# Patient Record
Sex: Female | Born: 1935 | Hispanic: No | Smoking: Never smoker
Health system: Southern US, Community
[De-identification: ages and names within clinical notes are randomized; demographics above are authoritative.]

## PROBLEM LIST (undated history)

## (undated) DIAGNOSIS — I639 Cerebral infarction, unspecified: Secondary | ICD-10-CM

## (undated) DIAGNOSIS — F32A Depression, unspecified: Secondary | ICD-10-CM

## (undated) DIAGNOSIS — K759 Inflammatory liver disease, unspecified: Secondary | ICD-10-CM

## (undated) DIAGNOSIS — M1990 Unspecified osteoarthritis, unspecified site: Secondary | ICD-10-CM

## (undated) DIAGNOSIS — G4700 Insomnia, unspecified: Secondary | ICD-10-CM

## (undated) DIAGNOSIS — C55 Malignant neoplasm of uterus, part unspecified: Secondary | ICD-10-CM

## (undated) DIAGNOSIS — E785 Hyperlipidemia, unspecified: Secondary | ICD-10-CM

## (undated) DIAGNOSIS — I10 Essential (primary) hypertension: Secondary | ICD-10-CM

## (undated) DIAGNOSIS — C4490 Unspecified malignant neoplasm of skin, unspecified: Secondary | ICD-10-CM

## (undated) DIAGNOSIS — F0390 Unspecified dementia without behavioral disturbance: Secondary | ICD-10-CM

## (undated) DIAGNOSIS — F329 Major depressive disorder, single episode, unspecified: Secondary | ICD-10-CM

## (undated) HISTORY — DX: Essential (primary) hypertension: I10

## (undated) HISTORY — DX: Hyperlipidemia, unspecified: E78.5

## (undated) HISTORY — DX: Cerebral infarction, unspecified: I63.9

## (undated) HISTORY — PX: APPENDECTOMY: SHX54

## (undated) HISTORY — DX: Insomnia, unspecified: G47.00

## (undated) HISTORY — DX: Malignant neoplasm of uterus, part unspecified: C55

## (undated) HISTORY — PX: TONSILLECTOMY: SUR1361

---

## 2012-02-08 ENCOUNTER — Ambulatory Visit (INDEPENDENT_AMBULATORY_CARE_PROVIDER_SITE_OTHER): Payer: Medicare Other | Admitting: Family Medicine

## 2012-02-08 ENCOUNTER — Encounter: Payer: Self-pay | Admitting: Family Medicine

## 2012-02-08 VITALS — BP 160/90 | HR 84 | Temp 97.9°F | Ht 68.0 in | Wt 157.4 lb

## 2012-02-08 DIAGNOSIS — E785 Hyperlipidemia, unspecified: Secondary | ICD-10-CM | POA: Insufficient documentation

## 2012-02-08 DIAGNOSIS — R0989 Other specified symptoms and signs involving the circulatory and respiratory systems: Secondary | ICD-10-CM

## 2012-02-08 DIAGNOSIS — I10 Essential (primary) hypertension: Secondary | ICD-10-CM | POA: Diagnosis not present

## 2012-02-08 DIAGNOSIS — Z0000 Encounter for general adult medical examination without abnormal findings: Secondary | ICD-10-CM

## 2012-02-08 DIAGNOSIS — R0683 Snoring: Secondary | ICD-10-CM

## 2012-02-08 DIAGNOSIS — R413 Other amnesia: Secondary | ICD-10-CM | POA: Diagnosis not present

## 2012-02-08 DIAGNOSIS — R0609 Other forms of dyspnea: Secondary | ICD-10-CM | POA: Diagnosis not present

## 2012-02-08 DIAGNOSIS — Z23 Encounter for immunization: Secondary | ICD-10-CM

## 2012-02-08 DIAGNOSIS — G4700 Insomnia, unspecified: Secondary | ICD-10-CM

## 2012-02-08 MED ORDER — AMLODIPINE BESYLATE 10 MG PO TABS: 10.0000 mg | ORAL_TABLET | Freq: Every day | ORAL | Status: AC

## 2012-02-08 MED ORDER — ZOSTER VACCINE LIVE 19400 UNT/0.65ML ~~LOC~~ SOLR: 0.6500 mL | Freq: Once | SUBCUTANEOUS | Status: DC

## 2012-02-08 MED ORDER — ZOSTER VACCINE LIVE 19400 UNT/0.65ML ~~LOC~~ SOLR: 0.6500 mL | Freq: Once | SUBCUTANEOUS | Status: AC

## 2012-02-08 MED ORDER — LISINOPRIL 10 MG PO TABS: 10.0000 mg | ORAL_TABLET | Freq: Every day | ORAL | Status: DC

## 2012-02-08 NOTE — Progress Notes (Signed)
  Subjective:    Patient ID: Kimberly Craig, female    DOB: July 16, 1930, 78 y.o.   MRN: [id number]  HPI Pt is here with her son to establish.   She is having trouble with memory.  She just moved down from [place] and her son said she was abusing sleeping pills and pain meds right along her with her daughter (his sister).  He is her so she can "start from scratch".   Review of Systems Review of Systems  Constitutional: Negative for activity change, appetite change and fatigue.  HENT: Negative for hearing loss, congestion, tinnitus and ear discharge.  dentist q25m Eyes: Negative for visual disturbance (see optho q1y -- vision corrected to 20/20 with glasses).  Respiratory: Negative for cough, chest tightness and shortness of breath.   Cardiovascular: Negative for chest pain, palpitations and leg swelling.  Gastrointestinal: Negative for abdominal pain, diarrhea, constipation and abdominal distention.  Genitourinary: Negative for urgency, frequency, decreased urine volume and difficulty urinating.  Musculoskeletal: Negative for back pain, arthralgias and gait problem.  Skin: Negative for color change, pallor and rash.  Neurological: Negative for dizziness, light-headedness, numbness and headaches.  Hematological: Negative for adenopathy. Does not bruise/bleed easily.  Psychiatric/Behavioral: Negative for suicidal ideas, confusion, sleep disturbance, self-injury, dysphoric mood, decreased concentration and agitation.         Objective:   Physical Exam  Constitutional: She is oriented to person, place, and time. She appears well-developed and well-nourished.  HENT:  Head: Normocephalic.  Right Ear: External ear normal.  Left Ear: External ear normal.  Mouth/Throat: No oropharyngeal exudate.  Eyes: Conjunctivae are normal. Pupils are equal, round, and reactive to light.  Neck: Normal range of motion. Neck supple.  Cardiovascular: Normal rate, regular rhythm and normal heart sounds.   No  murmur heard. Pulmonary/Chest: Effort normal and breath sounds normal. No respiratory distress. She has no wheezes. She has no rales. She exhibits no tenderness.  Musculoskeletal: Normal range of motion. She exhibits no edema.  Lymphadenopathy:    She has no cervical adenopathy.  Neurological: She is alert and oriented to person, place, and time.  Skin: Skin is warm and dry.  Psychiatric: She has a normal mood and affect. Her behavior is normal. Judgment and thought content normal.   MMSE-- 25/30        Assessment & Plan:

## 2012-02-08 NOTE — Patient Instructions (Signed)

## 2012-02-09 ENCOUNTER — Encounter: Payer: Self-pay | Admitting: Neurology

## 2012-02-09 DIAGNOSIS — R413 Other amnesia: Secondary | ICD-10-CM | POA: Insufficient documentation

## 2012-02-09 NOTE — Assessment & Plan Note (Signed)
Uncontrolled---restart lisinopril----son is unsure if she was actually taking 2 pills in [place]---will start one and add norvasc if needed, later

## 2012-02-09 NOTE — Assessment & Plan Note (Signed)
Family requesting neuro referral Check labs Pt answered all questions appropriately with exam---MMSE 25/30 but pt just moved to area so she did not know county she lived in

## 2012-02-09 NOTE — Assessment & Plan Note (Signed)
Check labs 

## 2012-02-10 ENCOUNTER — Other Ambulatory Visit (INDEPENDENT_AMBULATORY_CARE_PROVIDER_SITE_OTHER): Payer: Medicare Other

## 2012-02-10 DIAGNOSIS — R413 Other amnesia: Secondary | ICD-10-CM | POA: Diagnosis not present

## 2012-02-10 DIAGNOSIS — I10 Essential (primary) hypertension: Secondary | ICD-10-CM

## 2012-02-10 DIAGNOSIS — E785 Hyperlipidemia, unspecified: Secondary | ICD-10-CM | POA: Diagnosis not present

## 2012-02-10 LAB — CBC WITH DIFFERENTIAL/PLATELET
Basophils Absolute: 0 10*3/uL (ref 0.0–0.1)
Eosinophils Relative: 3 % (ref 0.0–5.0)
HCT: 42 % (ref 36.0–46.0)
Hemoglobin: 13.9 g/dL (ref 12.0–15.0)
Lymphocytes Relative: 42.3 % (ref 12.0–46.0)
Lymphs Abs: 2.6 10*3/uL (ref 0.7–4.0)
Monocytes Relative: 11.2 % (ref 3.0–12.0)
Neutro Abs: 2.7 10*3/uL (ref 1.4–7.7)
Platelets: 242 10*3/uL (ref 150.0–400.0)
RDW: 13.5 % (ref 11.5–14.6)
WBC: 6.2 10*3/uL (ref 4.5–10.5)

## 2012-02-10 LAB — HEPATIC FUNCTION PANEL
ALT: 18 U/L (ref 0–35)
AST: 18 U/L (ref 0–37)
Total Protein: 6.9 g/dL (ref 6.0–8.3)

## 2012-02-10 LAB — BASIC METABOLIC PANEL
Calcium: 9.3 mg/dL (ref 8.4–10.5)
GFR: 54.19 mL/min — ABNORMAL LOW (ref >60.00)
Glucose, Bld: 90 mg/dL (ref 70–99)
Potassium: 4.6 mEq/L (ref 3.5–5.1)
Sodium: 140 mEq/L (ref 135–145)

## 2012-02-10 LAB — TSH: TSH: 1.74 u[IU]/mL (ref 0.35–5.50)

## 2012-02-10 LAB — URINALYSIS, ROUTINE W REFLEX MICROSCOPIC
Bilirubin Urine: NEGATIVE
Hgb urine dipstick: NEGATIVE
Nitrite: NEGATIVE

## 2012-02-10 LAB — LIPID PANEL
Cholesterol: 242 mg/dL — ABNORMAL HIGH (ref 0–200)
HDL: 49.3 mg/dL (ref >39.00)
Triglycerides: 142 mg/dL (ref 0.0–149.0)

## 2012-02-10 LAB — LDL CHOLESTEROL, DIRECT: Direct LDL: 174.4 mg/dL

## 2012-02-21 ENCOUNTER — Other Ambulatory Visit: Payer: Self-pay | Admitting: *Deleted

## 2012-02-21 MED ORDER — ATORVASTATIN CALCIUM 10 MG PO TABS: 10.0000 mg | ORAL_TABLET | Freq: Every day | ORAL | Status: DC

## 2012-02-22 ENCOUNTER — Telehealth: Payer: Self-pay

## 2012-02-22 ENCOUNTER — Ambulatory Visit (INDEPENDENT_AMBULATORY_CARE_PROVIDER_SITE_OTHER): Payer: Medicare Other | Admitting: Family Medicine

## 2012-02-22 ENCOUNTER — Encounter: Payer: Self-pay | Admitting: Family Medicine

## 2012-02-22 VITALS — BP 154/82 | HR 67 | Temp 98.1°F | Wt 158.8 lb

## 2012-02-22 DIAGNOSIS — E785 Hyperlipidemia, unspecified: Secondary | ICD-10-CM

## 2012-02-22 DIAGNOSIS — I10 Essential (primary) hypertension: Secondary | ICD-10-CM | POA: Diagnosis not present

## 2012-02-22 DIAGNOSIS — E538 Deficiency of other specified B group vitamins: Secondary | ICD-10-CM | POA: Diagnosis not present

## 2012-02-22 DIAGNOSIS — D518 Other vitamin B12 deficiency anemias: Secondary | ICD-10-CM

## 2012-02-22 MED ORDER — CYANOCOBALAMIN 1000 MCG/ML IJ SOLN
1000.0000 ug | Freq: Once | INTRAMUSCULAR | Status: AC
  Administered 2012-02-22: 1000 ug via INTRAMUSCULAR

## 2012-02-22 NOTE — Progress Notes (Signed)
  Subjective:    Patient here for follow-up of elevated blood pressure.  She is not exercising and is adherent to a low-salt diet.  Blood pressure is not well controlled at home. Cardiac symptoms: none. Patient denies: chest pain, chest pressure/discomfort, claudication, dyspnea, exertional chest pressure/discomfort, irregular heart beat, lower extremity edema, near-syncope, orthopnea, palpitations, paroxysmal nocturnal dyspnea, syncope and tachypnea. Cardiovascular risk factors: advanced age (older than 26 for men, 21 for women), dyslipidemia, hypertension and sedentary lifestyle. Use of agents associated with hypertension: none. History of target organ damage: none.  The following portions of the patient's history were reviewed and updated as appropriate: allergies, current medications, past family history, past medical history, past social history, past surgical history and problem list.  Review of Systems Pertinent items are noted in HPI.     Objective:    BP 154/82  Pulse 67  Temp(Src) 98.1 F (36.7 C) (Oral)  Wt 158 lb 12.8 oz (72.031 kg)  SpO2 95% General appearance: alert, cooperative, appears stated age and no distress Head: Normocephalic, without obvious abnormality, atraumatic Lungs: clear to auscultation bilaterally Heart: S1, S2 normal and murmur  2/6 Extremities: extremities normal, atraumatic, no cyanosis or edema    Assessment:    Hypertension, stage 1 -- pt noncompliant with meds secondary to memory issues. Evidence of target organ damage: none.   memory loss-- neuro appointmtent pending--- [facility] also consulted to help with meds b12 deficiency--- start injections today Plan:    Medication: no change. Dietary sodium restriction. Regular aerobic exercise. Check blood pressures 2-3 times weekly and record. Follow up: 3 weeks and as needed.

## 2012-02-22 NOTE — Patient Instructions (Signed)

## 2012-02-22 NOTE — Telephone Encounter (Signed)
Referral for [facility]- for medication management and HTN. Patient is having a hard time remembering to take her pills daily.  [facility] will help with patient needs. Her case manager will be Jodean Lima [phone] and the patient will be contacted in 10 business day. I called the patient's son and left  Detailed message and advise him to call with any questions or concerns.     KP

## 2012-02-29 ENCOUNTER — Ambulatory Visit (INDEPENDENT_AMBULATORY_CARE_PROVIDER_SITE_OTHER): Payer: Medicare Other | Admitting: Pulmonary Disease

## 2012-02-29 ENCOUNTER — Encounter: Payer: Self-pay | Admitting: Pulmonary Disease

## 2012-02-29 VITALS — BP 166/76 | HR 88 | Temp 98.6°F | Ht 68.5 in | Wt 159.0 lb

## 2012-02-29 DIAGNOSIS — G4700 Insomnia, unspecified: Secondary | ICD-10-CM | POA: Diagnosis not present

## 2012-02-29 NOTE — Patient Instructions (Signed)
No sleep testing required Red flags - witnessed pauses in breathing, Bp requiring 3 medications

## 2012-02-29 NOTE — Progress Notes (Signed)
  Subjective:    Patient ID: Kimberly Craig, female    DOB: [birth year], 78 y.o.   MRN: [id number]  HPI  Referred by Dr. Laury Axon.  78 year old woman for evaluation of insomnia and obstructive sleep apnea. She is accompanied by her son. She is moved from [place] to [place]. Her son is concerned that she was overmedicated by doctors in [place]. She reported insomnia for acute onset and was placed on Ambien and took it for a year or 2. When she moved to [facility] , this was stopped in 2/13, and she had insomnia for a couple of weeks but now seems to have reverted to her normal sleep pattern. Patient states sleeps at least 8 hours every night without waking up. Son states snores  a little bit.  Sleepiness score is 5/24 Bedtime is between 9 to 10 PM, sleep latency is 30 minutes, she has 0 to 1 awakenings and is out of bed by 8 AM feeling rested without dryness or morning headaches. There are no witnessed apneas or gasping episodes during sleep. This no recent weight gain. She drinks one to 2 cups of coffee a day There is no history suggestive of cataplexy, sleep paralysis or parasomnias   Review of Systems  Constitutional: Negative for fever and unexpected weight change.  HENT: Negative for ear pain, nosebleeds, congestion, sore throat, rhinorrhea, sneezing, trouble swallowing, dental problem, postnasal drip and sinus pressure.   Eyes: Negative for redness and itching.  Respiratory: Negative for cough, chest tightness, shortness of breath and wheezing.   Cardiovascular: Negative for palpitations and leg swelling.  Gastrointestinal: Negative for nausea and vomiting.  Genitourinary: Negative for dysuria.  Musculoskeletal: Negative for joint swelling.  Skin: Negative for rash.  Neurological: Negative for headaches.  Hematological: Does not bruise/bleed easily.  Psychiatric/Behavioral: Negative for dysphoric mood. The patient is not nervous/anxious.        Objective:   Physical  Exam  Gen. Pleasant, well-nourished, in no distress, normal affect ENT - no lesions, no post nasal drip Neck: No JVD, no thyromegaly, no carotid bruits Lungs: no use of accessory muscles, no dullness to percussion, clear without rales or rhonchi  Cardiovascular: Rhythm regular, heart sounds  normal, no murmurs or gallops, no peripheral edema Abdomen: soft and non-tender, no hepatosplenomegaly, BS normal. Musculoskeletal: No deformities, no cyanosis or clubbing Neuro:  alert, non focal       Assessment & Plan:

## 2012-03-01 DIAGNOSIS — G4700 Insomnia, unspecified: Secondary | ICD-10-CM | POA: Insufficient documentation

## 2012-03-01 NOTE — Assessment & Plan Note (Signed)
Transient, now resolved. The seems to have been due to abrupt stoppage of Ambien. We discussed behavioral techniques for insomnia in the future. In the absence of excessive daytime somnolence, loud snoring or witnessed apneas and do not feel that obstructive sleep apnea is likely. As such I do not feel an overnight polysomnogram is indicated. We discussed warning signs of sleep apnea and other sleep morbidity such as restless leg syndrome

## 2012-03-16 ENCOUNTER — Ambulatory Visit (INDEPENDENT_AMBULATORY_CARE_PROVIDER_SITE_OTHER): Payer: Medicare Other | Admitting: Family Medicine

## 2012-03-16 ENCOUNTER — Encounter: Payer: Self-pay | Admitting: Family Medicine

## 2012-03-16 VITALS — BP 152/86 | HR 85 | Temp 97.7°F | Wt 157.0 lb

## 2012-03-16 DIAGNOSIS — R413 Other amnesia: Secondary | ICD-10-CM

## 2012-03-16 DIAGNOSIS — I10 Essential (primary) hypertension: Secondary | ICD-10-CM | POA: Diagnosis not present

## 2012-03-16 DIAGNOSIS — D518 Other vitamin B12 deficiency anemias: Secondary | ICD-10-CM

## 2012-03-16 MED ORDER — CYANOCOBALAMIN 1000 MCG/ML IJ SOLN
1000.0000 ug | Freq: Once | INTRAMUSCULAR | Status: AC
  Administered 2012-03-16: 1000 ug via INTRAMUSCULAR

## 2012-03-16 NOTE — Progress Notes (Signed)
  Subjective:    Patient here for follow-up of elevated blood pressure.  She is not exercising and is not adherent to a low-salt diet.  Blood pressure is not well controlled at home. Cardiac symptoms: none. Patient denies: chest pain, chest pressure/discomfort, claudication, dyspnea, exertional chest pressure/discomfort, fatigue, irregular heart beat, lower extremity edema, near-syncope, orthopnea, palpitations, paroxysmal nocturnal dyspnea, syncope, tachypnea and -. Cardiovascular risk factors: advanced age (older than 24 for men, 74 for women), hypertension, sedentary lifestyle and noncompliance secondary to memory loss. Use of agents associated with hypertension: none. History of target organ damage: none.  Pt son is present.   She is not remembering to take her meds despite putting her pills in a pill box.  She is also not bathing or eating well.     The following portions of the patient's history were reviewed and updated as appropriate: allergies, current medications, past family history, past medical history, past social history, past surgical history and problem list.  Review of Systems Pertinent items are noted in HPI.     Objective:    BP 152/86  Pulse 85  Temp(Src) 97.7 F (36.5 C) (Oral)  Wt 157 lb (71.215 kg)  SpO2 96% General appearance: pleasantly confused Lungs: clear to auscultation bilaterally Heart: S1, S2 normal Extremities: extremities normal, atraumatic, no cyanosis or edema    Assessment:    1 Hypertension, stage 1 uncontrolled due to noncompliance / memory loss . Evidence of target organ damage: none.   2. Memory loss--- pt is not taking meds , bathing herself or eating well.  I d/w son that she probably needs assisted living or memory care.  He said his mom was resisting the move.  He wants to wait to talk to neuro tomorrow.   Plan:    Medication: no change. Check blood pressures 1 times daily and record. Follow up: 1 week and as needed. rto cpe

## 2012-03-16 NOTE — Patient Instructions (Signed)
Dementia  Dementia is a general term for problems with brain function. A person with dementia has memory loss and a hard time with at least one other brain function such as thinking, speaking, or problem solving. Dementia can affect social functioning, how you do your job, your mood, or your personality. The changes may be hidden for a long time. The earliest forms of this disease are usually not detected by family or friends.  Dementia can be:   Irreversible.   Potentially reversible.   Partially reversible.   Progressive. This means it can get worse over time.  CAUSES   Irreversible dementia causes may include:   Degeneration of brain cells (Alzheimer's disease or lewy body dementia).   Multiple small strokes (vascular dementia).   Infection (chronic meningitis or Creutzfelt-Jakob disease).   Frontotemporal dementia. This affects younger people, age 40 to 70, compared to those who have Alzheimer's disease.   Dementia associated with other disorders like Parkinson's disease, Huntington's disease, or HIV-associated dementia.  Potentially or partially reversible dementia causes may include:   Medicines.   Metabolic causes such as excessive alcohol intake, vitamin B12 deficiency, or thyroid disease.   Masses or pressure in the brain such as a tumor, blood clot, or hydrocephalus.  SYMPTOMS   Symptoms are often hard to detect. Family members or coworkers may not notice them early in the disease process. Different people with dementia may have different symptoms. Symptoms can include:   A hard time with memory, especially recent memory. Long-term memory may not be impaired.   Asking the same question multiple times or forgetting something someone just said.   A hard time speaking your thoughts or finding certain words.   A hard time solving problems or performing familiar tasks (such as how to use a telephone).   Sudden changes in mood.   Changes in personality, especially increasing moodiness or  mistrust.   Depression.   A hard time understanding complex ideas that were never a problem in the past.  DIAGNOSIS   There are no specific tests for dementia.    Your caregiver may recommend a thorough evaluation. This is because some forms of dementia can be reversible. The evaluation will likely include a physical exam and getting a detailed history from you and a family member. The history often gives the best clues and suggestions for a diagnosis.   Memory testing may be done. A detailed brain function evaluation called neuropsychologic testing may be helpful.   Lab tests and brain imaging (such as a CT scan or MRI scan) are sometimes important.   Sometimes observation and re-evaluation over time is very helpful.  TREATMENT   Treatment depends on the cause.    If the problem is a vitamin deficiency, it may be helped or cured with supplements.   For dementias such as Alzheimer's disease, medicines are available to stabilize or slow the course of the disease. There are no cures for this type of dementia.   Your caregiver can help direct you to groups, organizations, and other caregivers to help with decisions in the care of you or your loved one.  HOME CARE INSTRUCTIONS  The care of individuals with dementia is varied and dependent upon the progression of the dementia. The following suggestions are intended for the person living with, or caring for, the person with dementia.   Create a safe environment.   Remove the locks on bathroom doors to prevent the person from accidentally locking himself or herself in.     Use childproof latches on kitchen cabinets and any place where cleaning supplies, chemicals, or alcohol are kept.   Use childproof covers in unused electrical outlets.   Install childproof devices to keep doors and windows secured.   Remove stove knobs or install safety knobs and an automatic shut-off on the stove.   Lower the temperature on water heaters.   Label medicines and keep them  locked up.   Secure knives, lighters, matches, power tools, and guns, and keep these items out of reach.   Keep the house free from clutter. Remove rugs or anything that might contribute to a fall.   Remove objects that might break and hurt the person.   Make sure lighting is good, both inside and outside.   Install grab rails as needed.   Use a monitoring device to alert you to falls or other needs for help.   Reduce confusion.   Keep familiar objects and people around.   Use night lights or dim lights at night.   Label items or areas.   Use reminders, notes, or directions for daily activities or tasks.   Keep a simple, consistent routine for waking, meals, bathing, dressing, and bedtime.   Create a calm, quiet environment.   Place large clocks and calendars prominently.   Display emergency numbers and home address near all telephones.   Use cues to establish different times of the day. An example is to open curtains to let the natural light in during the day.    Use effective communication.   Choose simple words and short sentences.   Use a gentle, calm tone of voice.   Be careful not to interrupt.   If the person is struggling to find a word or communicate a thought, try to provide the word or thought.   Ask one question at a time. Allow the person ample time to answer questions. Repeat the question again if the person does not respond.   Reduce nighttime restlessness.   Provide a comfortable bed.   Have a consistent nighttime routine.   Ensure a regular walking or physical activity schedule. Involve the person in daily activities as much as possible.   Limit napping during the day.   Limit caffeine.   Attend social events that stimulate rather than overwhelm the senses.   Encourage good nutrition and hydration.   Reduce distractions during meal times and snacks.   Avoid foods that are too hot or too cold.   Monitor chewing and swallowing ability.   Continue with routine vision,  hearing, dental, and medical screenings.   Only give over-the-counter or prescription medicines as directed by the caregiver.   Monitor driving abilities. Do not allow the person to drive when safe driving is no longer possible.   Register with an identification program which could provide location assistance in the event of a missing person situation.  SEEK MEDICAL CARE IF:    New behavioral problems start such as moodiness, aggressiveness, or seeing things that are not there (hallucinations).   Any new problem with brain function happens. This includes problems with balance, speech, or falling a lot.   Problems with swallowing develop.   Any symptoms of other illness happen.  Small changes or worsening in any aspect of brain function can be a sign that the illness is getting worse. It can also be a sign of another medical illness such as infection. Seeing a caregiver right away is important.  SEEK IMMEDIATE MEDICAL CARE IF:      A fever develops.   New or worsened confusion develops.   New or worsened sleepiness develops.   Staying awake becomes hard to do.  Document Released: 04/27/2001 Document Revised: 10/21/2011 Document Reviewed: 03/29/2011  ExitCare Patient Information 2012 [employer].

## 2012-03-17 ENCOUNTER — Ambulatory Visit (INDEPENDENT_AMBULATORY_CARE_PROVIDER_SITE_OTHER): Payer: Medicare Other | Admitting: Neurology

## 2012-03-17 ENCOUNTER — Encounter: Payer: Self-pay | Admitting: Neurology

## 2012-03-17 VITALS — BP 148/80 | HR 92 | Wt 159.0 lb

## 2012-03-17 DIAGNOSIS — R413 Other amnesia: Secondary | ICD-10-CM | POA: Diagnosis not present

## 2012-03-17 NOTE — Progress Notes (Signed)
Dear Dr. Laury Axon,  Thank you for having me see Kimberly Craig in consultation today at [facility] for her problem with memory.  As you may recall, she is a 78 y.o. year old female with a history of a presumed left hemisphere ischemic stroke who has had worsening memory over the last year.  At that time she was living in [place] but recently has moved down to [place] where her son and daughter in law(who accompanies her today).  She frequently forgets conversations, asks the same questions multiple times in a day, and repeats stories.  She seems to have worsening of anxiety, particularly in the evening hours.  She is in an independent living facility so she doesn't cook for herself currently.  There are no abnormal behaviors or clear delusions.  There are no hallucinations.  She has had no falls.  She does not have a history of head trauma or seizure.  She does have a history of ischemic stroke that apparently affected her speech about 15 years ago.  She says that she has had "mini strokes" but her daughter does not know the details and neither does the patient.  She has minimal alcohol use, perhaps 1 per week.  She has had no change in her bladder function and denies changes in her gait.  Past Medical History  Diagnosis Date  . Hypertension   . Hyperlipidemia   . Insomnia   . Uterine cancer   . TIA (transient ischemic attack)     Past Surgical History  Procedure Date  . No surgical history 03/2012    History   Social History  . Marital Status: Married    Spouse Name: N/A    Number of Children: N/A  . Years of Education: N/A   Social History Main Topics  . Smoking status: Never Smoker   . Smokeless tobacco: Never Used  . Alcohol Use: No  . Drug Use: No  . Sexually Active: None   Other Topics Concern  . None   Social History Narrative  . None    Family History  Problem Relation Age of Onset  . Stroke Father     Current Outpatient Prescriptions on File Prior  to Visit  Medication Sig Dispense Refill  . amLODipine (NORVASC) 10 MG tablet Take 1 tablet (10 mg total) by mouth daily.  90 tablet  3  . atorvastatin (LIPITOR) 10 MG tablet Take 1 tablet (10 mg total) by mouth at bedtime.  30 tablet  2  . calcium gluconate 500 MG tablet Take 500 mg by mouth daily.      . Diclofenac Sodium CR 100 MG 24 hr tablet Take 100 mg by mouth daily.      Marland Kitchen lisinopril (PRINIVIL,ZESTRIL) 10 MG tablet Take 1 tablet (10 mg total) by mouth daily.  90 tablet  3  . traMADol-acetaminophen (ULTRACET) 37.5-325 MG per tablet Take 1 tablet by mouth every 6 (six) hours as needed.        No Known Allergies    ROS:  13 systems were reviewed and are notable for arthritis and an inability to concentrate.  All other review of systems are unremarkable.   Examination:  Filed Vitals:   03/17/12 1325  BP: 148/80  Pulse: 92  Weight: 159 lb (72.122 kg)     In general, well appearing women.   Cardiovascular: The patient has a regular rate and rhythm.  Fundoscopy:  Disks are flat. Vessel caliber within normal limits.  Mental status:  MMSE 4/5 date, 3/5 place, 3/3 regist; 4/5 WORLD, 3/3 recall; otherwise full points 26/30  FRS: + PM on left, - snout, + glab  Cranial Nerves: Pupils are equally round and reactive to light. Visual fields full to confrontation. Extraocular movements are intact without nystagmus. Facial sensation and muscles of mastication are intact. Muscles of facial expression are symmetric. Hearing intact to bilateral finger rub. Tongue protrusion, uvula, palate midline.  Shoulder shrug intact  Motor:  The patient has normal bulk and tone, no pronator drift.  There are no adventitious movements.  5/5 muscle strength bilaterally.  Reflexes:   Biceps  Triceps Brachioradialis Knee Ankle  Right 3+  3+  3+   2+ 2+  Left  3+  3+  2+   2+ 2+  Toes down  Coordination:  Normal finger to nose.  No dysdiadokinesia.  Sensation is intact to light touch.  Gait  and Station are mildly unstead.    Romberg is negative   Impression/Recs: 1. Memory problems - likely Alzheimer's vs. Vascular vs. Mixed dementia.  I am going to get NP testing as well as get an MRI of her brain especially considering her history of stroke.  They will call me after her memory testing and we will discuss starting donepezil.   We will see the patient back in 3 months.  Thank you for having [name] see Kimberly Craig in consultation.  Feel free to contact me with any questions.  Lupita Raider Modesto Charon, MD [facility], [place] [address] [place], [place] [address] Phone: [phone] Fax: [phone].

## 2012-03-17 NOTE — Patient Instructions (Signed)
Your MRI is scheduled for Thursday, May 9th at 9:00am.  Please arrive to [facility], first floor admitting by 8:45am.  [phone].  We will call you with your appointment to [facility].  [address] [place], [place]  [phone]

## 2012-03-22 ENCOUNTER — Ambulatory Visit: Payer: Medicare Other

## 2012-03-23 ENCOUNTER — Other Ambulatory Visit (HOSPITAL_COMMUNITY): Payer: Medicare Other

## 2012-04-03 ENCOUNTER — Telehealth: Payer: Self-pay | Admitting: Family Medicine

## 2012-04-03 MED ORDER — TRAMADOL-ACETAMINOPHEN 37.5-325 MG PO TABS: 1.0000 | ORAL_TABLET | Freq: Four times a day (QID) | ORAL | Status: DC | PRN

## 2012-04-03 NOTE — Telephone Encounter (Signed)
Pt son states that the pt needs Dr. Laury Axon to fill her rx for ultracet. Dr. Laury Axon has not previously prescribed it, and that is the reason for calling the office instead of the pharmacy. Pt uses [facility] on [address]

## 2012-04-03 NOTE — Telephone Encounter (Signed)
1 po q6h prn #[id number]   

## 2012-04-03 NOTE — Telephone Encounter (Signed)
Last seen 03/16/12. Please advise    KP

## 2012-04-03 NOTE — Telephone Encounter (Signed)
Rx sent and patient's son has been made aware.     KP

## 2012-04-04 ENCOUNTER — Ambulatory Visit (HOSPITAL_COMMUNITY)
Admission: RE | Admit: 2012-04-04 | Discharge: 2012-04-04 | Disposition: A | Discharge status: Home / Self Care | Payer: Medicare Other | Source: Ambulatory Visit | Attending: Neurology | Admitting: Neurology

## 2012-04-04 DIAGNOSIS — G9389 Other specified disorders of brain: Secondary | ICD-10-CM | POA: Diagnosis not present

## 2012-04-04 DIAGNOSIS — F29 Unspecified psychosis not due to a substance or known physiological condition: Secondary | ICD-10-CM | POA: Diagnosis not present

## 2012-04-04 DIAGNOSIS — R413 Other amnesia: Secondary | ICD-10-CM | POA: Diagnosis not present

## 2012-04-04 DIAGNOSIS — G319 Degenerative disease of nervous system, unspecified: Secondary | ICD-10-CM | POA: Diagnosis not present

## 2012-04-04 DIAGNOSIS — E785 Hyperlipidemia, unspecified: Secondary | ICD-10-CM | POA: Diagnosis not present

## 2012-04-04 DIAGNOSIS — D320 Benign neoplasm of cerebral meninges: Secondary | ICD-10-CM | POA: Insufficient documentation

## 2012-04-04 DIAGNOSIS — I10 Essential (primary) hypertension: Secondary | ICD-10-CM | POA: Diagnosis not present

## 2012-04-04 DIAGNOSIS — I69998 Other sequelae following unspecified cerebrovascular disease: Secondary | ICD-10-CM | POA: Diagnosis not present

## 2012-04-11 ENCOUNTER — Telehealth: Payer: Self-pay

## 2012-04-11 NOTE — Telephone Encounter (Signed)
Pt's son calling for MRI results.

## 2012-04-12 NOTE — Telephone Encounter (Signed)
MRI revealed white matter changes we typically see in individuals who have poorly controlled blood pressure -- these changes may be causing her memory problems.   Also, saw her old stroke on the MRI.  I didn't see that she is taking aspirin, but if she can tolerate it she should be taking 325mg  of aspirin per day.    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 Laury Axon about following that or whether additional imaging is needed.

## 2012-04-13 DIAGNOSIS — R413 Other amnesia: Secondary | ICD-10-CM | POA: Diagnosis not present

## 2012-04-13 NOTE — Telephone Encounter (Signed)
Called and spoke with the patient's son, Jeannett Senior. Information given as per Dr. Modesto Charon below. No additional concerns voiced at this time.

## 2012-04-27 DIAGNOSIS — R413 Other amnesia: Secondary | ICD-10-CM | POA: Diagnosis not present

## 2012-05-10 ENCOUNTER — Other Ambulatory Visit: Payer: Self-pay | Admitting: Family Medicine

## 2012-05-10 MED ORDER — ATORVASTATIN CALCIUM 10 MG PO TABS: 10.0000 mg | ORAL_TABLET | Freq: Every day | ORAL | Status: DC

## 2012-05-10 NOTE — Telephone Encounter (Signed)
refill Atorvastatin 10MG  Tablet #90 Last ov 5.2.13 Last wrt 4.8.13 qty 30 wt/2-refills  Take one tablet by  Mouth at bedtime

## 2012-06-20 ENCOUNTER — Ambulatory Visit: Payer: Medicare Other | Admitting: Neurology

## 2012-06-27 ENCOUNTER — Ambulatory Visit (INDEPENDENT_AMBULATORY_CARE_PROVIDER_SITE_OTHER): Payer: Medicare Other | Admitting: Neurology

## 2012-06-27 ENCOUNTER — Encounter: Payer: Self-pay | Admitting: Neurology

## 2012-06-27 VITALS — BP 152/80 | HR 72 | Wt 170.0 lb

## 2012-06-27 DIAGNOSIS — R413 Other amnesia: Secondary | ICD-10-CM

## 2012-06-27 MED ORDER — DONEPEZIL HCL 10 MG PO TABS: ORAL_TABLET | ORAL | Status: DC

## 2012-06-27 MED ORDER — DONEPEZIL HCL 5 MG PO TABS: 5.0000 mg | ORAL_TABLET | Freq: Every day | ORAL | Status: DC

## 2012-06-27 NOTE — Patient Instructions (Signed)
aspirin 325mg  enteric coated once a day.

## 2012-06-27 NOTE — Progress Notes (Signed)
Dear Dr. Laury Axon,  I saw  Kimberly Craig back in [facility] for her problem with memory loss.  As you may recall, she is a 78 y.o. year old female with a history of hypertension and a left pontine ischemic stroke.  At her last visit, I felt that she had dementia.  NP testing confirmed this recently.  She has been stable, although still experiences worsening confusion at night.  However, there has been no behavioral agitation or aggression and no hallucinations.  MRI revealed very significant white matter changes.  I had instructed them to start an aspirin 325mg  daily, but they have not done that yet.  Medical history, social history, and family history were reviewed and have not changed since the last clinic visit.  Current Outpatient Prescriptions on File Prior to Visit  Medication Sig Dispense Refill  . amLODipine (NORVASC) 10 MG tablet Take 1 tablet (10 mg total) by mouth daily.  90 tablet  3  . atorvastatin (LIPITOR) 10 MG tablet Take 1 tablet (10 mg total) by mouth at bedtime.  90 tablet  0  . calcium gluconate 500 MG tablet Take 500 mg by mouth daily.      . Diclofenac Sodium CR 100 MG 24 hr tablet Take 100 mg by mouth daily.      Marland Kitchen lisinopril (PRINIVIL,ZESTRIL) 10 MG tablet Take 1 tablet (10 mg total) by mouth daily.  90 tablet  3  . traMADol-acetaminophen (ULTRACET) 37.5-325 MG per tablet Take 1 tablet by mouth every 6 (six) hours as needed.  60 tablet  0  . donepezil (ARICEPT) 10 MG tablet take 1 tablet at day, start after taking 5mg  tabs daily for 30 days.  30 tablet  3    No Known Allergies  ROS:  13 systems were reviewed and are notable for mild unsteadiness of gait.  All other review of systems are unremarkable.  Exam: . Filed Vitals:   06/27/12 0847  BP: 152/80  Pulse: 72  Weight: 170 lb (77.111 kg)    In general, well appearing women.   Impression/Recommendations:  1.  Dementia - likely vascular in origin or mixed with Alzheimer's.  Obviously controlling  her stroke risk factors is essential and I have given them written instructions on the type of aspirin they should take aspirin 325mg  EC daily.  After she starts that for two weeks, she will start donepezil 5mg  to increase to 10mg  after a month.  I am hopeful this stabilizes her memory loss for a while.  The patient will follow up with you.  Lupita Raider Modesto Charon, MD [facility], [place]

## 2012-07-08 ENCOUNTER — Other Ambulatory Visit: Payer: Self-pay | Admitting: Family Medicine

## 2012-07-14 ENCOUNTER — Other Ambulatory Visit: Payer: Self-pay | Admitting: Family Medicine

## 2012-07-14 MED ORDER — TRAMADOL-ACETAMINOPHEN 37.5-325 MG PO TABS: 1.0000 | ORAL_TABLET | Freq: Four times a day (QID) | ORAL | Status: DC | PRN

## 2012-07-14 NOTE — Telephone Encounter (Signed)
Please advise      KP 

## 2012-07-14 NOTE — Telephone Encounter (Signed)
Refill x1 

## 2012-07-14 NOTE — Telephone Encounter (Signed)
TRAMADOL-ACETAMINOPHIN 37.5-325 QTY:60 LAST REFILLED: 04/03/12 TAKE 1 TABLET BY MOUTH EVERY 6 HRS AS NEEDED.

## 2012-08-22 ENCOUNTER — Other Ambulatory Visit: Payer: Self-pay | Admitting: Neurology

## 2012-08-22 ENCOUNTER — Other Ambulatory Visit: Payer: Self-pay | Admitting: Family Medicine

## 2012-10-22 ENCOUNTER — Other Ambulatory Visit: Payer: Self-pay | Admitting: Family Medicine

## 2012-10-26 ENCOUNTER — Other Ambulatory Visit: Payer: Self-pay | Admitting: Family Medicine

## 2012-10-27 NOTE — Telephone Encounter (Signed)
Last seen 03/16/12 and filled 07/14/12 # 60. Please advise     KP

## 2013-03-04 ENCOUNTER — Other Ambulatory Visit: Payer: Self-pay | Admitting: Family Medicine

## 2013-03-05 NOTE — Telephone Encounter (Signed)
Last seen 03/16/12 and filled 10/26/12 #60. Please advise.     KP

## 2013-04-23 ENCOUNTER — Other Ambulatory Visit: Payer: Self-pay | Admitting: Family Medicine

## 2013-04-23 NOTE — Telephone Encounter (Signed)
Spoke with Daughter in law Aram Beecham and she said she would call back in a few minutes once she looks at her and her husbands schedules to schedule a follow up apt.      KP

## 2013-04-24 ENCOUNTER — Other Ambulatory Visit (INDEPENDENT_AMBULATORY_CARE_PROVIDER_SITE_OTHER): Payer: Medicare Other

## 2013-04-24 DIAGNOSIS — E785 Hyperlipidemia, unspecified: Secondary | ICD-10-CM | POA: Diagnosis not present

## 2013-04-24 LAB — LIPID PANEL
HDL: 41.2 mg/dL (ref >39.00)
Total CHOL/HDL Ratio: 4
Triglycerides: 142 mg/dL (ref 0.0–149.0)
VLDL: 28.4 mg/dL (ref 0.0–40.0)

## 2013-04-24 LAB — HEPATIC FUNCTION PANEL
ALT: 15 U/L (ref 0–35)
AST: 18 U/L (ref 0–37)
Albumin: 4.1 g/dL (ref 3.5–5.2)
Total Bilirubin: 1 mg/dL (ref 0.3–1.2)

## 2013-04-28 ENCOUNTER — Other Ambulatory Visit: Payer: Self-pay | Admitting: Family Medicine

## 2013-05-30 ENCOUNTER — Other Ambulatory Visit: Payer: Self-pay | Admitting: Family Medicine

## 2013-07-05 ENCOUNTER — Other Ambulatory Visit: Payer: Self-pay | Admitting: Family Medicine

## 2013-07-06 ENCOUNTER — Encounter: Payer: Self-pay | Admitting: Lab

## 2013-07-06 NOTE — Telephone Encounter (Signed)
Rx written by Dr.Wong        KP

## 2013-07-07 ENCOUNTER — Other Ambulatory Visit: Payer: Self-pay | Admitting: Family Medicine

## 2013-07-09 ENCOUNTER — Telehealth: Payer: Self-pay | Admitting: Family Medicine

## 2013-07-09 ENCOUNTER — Ambulatory Visit (INDEPENDENT_AMBULATORY_CARE_PROVIDER_SITE_OTHER): Payer: Medicare Other | Admitting: Family Medicine

## 2013-07-09 ENCOUNTER — Encounter: Payer: Self-pay | Admitting: Family Medicine

## 2013-07-09 ENCOUNTER — Ambulatory Visit: Payer: Medicare Other | Admitting: Family Medicine

## 2013-07-09 VITALS — BP 144/72 | HR 90 | Temp 98.1°F | Wt 168.8 lb

## 2013-07-09 DIAGNOSIS — R5381 Other malaise: Secondary | ICD-10-CM | POA: Diagnosis not present

## 2013-07-09 DIAGNOSIS — R413 Other amnesia: Secondary | ICD-10-CM

## 2013-07-09 DIAGNOSIS — F0391 Unspecified dementia with behavioral disturbance: Secondary | ICD-10-CM

## 2013-07-09 DIAGNOSIS — L600 Ingrowing nail: Secondary | ICD-10-CM | POA: Diagnosis not present

## 2013-07-09 DIAGNOSIS — E785 Hyperlipidemia, unspecified: Secondary | ICD-10-CM

## 2013-07-09 DIAGNOSIS — I10 Essential (primary) hypertension: Secondary | ICD-10-CM

## 2013-07-09 MED ORDER — CEPHALEXIN 500 MG PO CAPS: 500.0000 mg | ORAL_CAPSULE | Freq: Two times a day (BID) | ORAL | Status: DC

## 2013-07-09 MED ORDER — DONEPEZIL HCL 23 MG PO TABS: 23.0000 mg | ORAL_TABLET | Freq: Every day | ORAL | Status: DC

## 2013-07-09 MED ORDER — DONEPEZIL HCL 10 MG PO TABS: 10.0000 mg | ORAL_TABLET | Freq: Every day | ORAL | Status: DC

## 2013-07-09 NOTE — Assessment & Plan Note (Signed)
Check labs 

## 2013-07-09 NOTE — Progress Notes (Signed)
  Subjective:    Patient ID: Kimberly Craig, female    DOB: 07/08/1930, 79 y.o.   MRN: [id number]  HPI Pt here for f/u .  Her son is with her.  He c/o wosening memoy loss..  Otherwise he is doing well.   Review of Systems As above     Objective:   Physical Exam BP 144/72  Pulse 90  Temp(Src) 98.1 F (36.7 C) (Oral)  Wt 168 lb 12.8 oz (76.567 kg)  BMI 25.29 kg/m2  SpO2 96% General appearance: alert, cooperative, appears stated age and no distress Throat: lips, mucosa, and tongue normal; teeth and gums normal Neck: no adenopathy, no carotid bruit, no JVD, supple, symmetrical, trachea midline and thyroid not enlarged, symmetric, no tenderness/mass/nodules Lungs: clear to auscultation bilaterally Heart: S1, S2 normal Extremities: extremities normal, atraumatic, no cyanosis or edema      Assessment & Plan:

## 2013-07-09 NOTE — Patient Instructions (Addendum)

## 2013-07-09 NOTE — Assessment & Plan Note (Signed)
Stable con't meds 

## 2013-07-09 NOTE — Assessment & Plan Note (Signed)
Inc aricept to 23 mg daily Consider neuro referral if needed

## 2013-07-10 DIAGNOSIS — M6281 Muscle weakness (generalized): Secondary | ICD-10-CM | POA: Diagnosis not present

## 2013-07-11 DIAGNOSIS — M6281 Muscle weakness (generalized): Secondary | ICD-10-CM | POA: Diagnosis not present

## 2013-07-12 ENCOUNTER — Emergency Department (HOSPITAL_COMMUNITY): Payer: Medicare Other

## 2013-07-12 ENCOUNTER — Encounter (HOSPITAL_COMMUNITY): Payer: Self-pay | Admitting: Emergency Medicine

## 2013-07-12 ENCOUNTER — Emergency Department (HOSPITAL_COMMUNITY)
Admission: EM | Admit: 2013-07-12 | Discharge: 2013-07-12 | Disposition: A | Discharge status: Home / Self Care | Payer: Medicare Other | Attending: Emergency Medicine | Admitting: Emergency Medicine

## 2013-07-12 DIAGNOSIS — I10 Essential (primary) hypertension: Secondary | ICD-10-CM | POA: Diagnosis not present

## 2013-07-12 DIAGNOSIS — Z7982 Long term (current) use of aspirin: Secondary | ICD-10-CM | POA: Diagnosis not present

## 2013-07-12 DIAGNOSIS — Z8541 Personal history of malignant neoplasm of cervix uteri: Secondary | ICD-10-CM | POA: Insufficient documentation

## 2013-07-12 DIAGNOSIS — Z79899 Other long term (current) drug therapy: Secondary | ICD-10-CM | POA: Diagnosis not present

## 2013-07-12 DIAGNOSIS — E785 Hyperlipidemia, unspecified: Secondary | ICD-10-CM | POA: Insufficient documentation

## 2013-07-12 DIAGNOSIS — Z8669 Personal history of other diseases of the nervous system and sense organs: Secondary | ICD-10-CM | POA: Diagnosis not present

## 2013-07-12 DIAGNOSIS — M6281 Muscle weakness (generalized): Secondary | ICD-10-CM | POA: Diagnosis not present

## 2013-07-12 DIAGNOSIS — R4182 Altered mental status, unspecified: Secondary | ICD-10-CM | POA: Diagnosis not present

## 2013-07-12 DIAGNOSIS — K2970 Gastritis, unspecified, without bleeding: Secondary | ICD-10-CM | POA: Diagnosis not present

## 2013-07-12 DIAGNOSIS — R112 Nausea with vomiting, unspecified: Secondary | ICD-10-CM

## 2013-07-12 DIAGNOSIS — R109 Unspecified abdominal pain: Secondary | ICD-10-CM | POA: Diagnosis not present

## 2013-07-12 DIAGNOSIS — Z8673 Personal history of transient ischemic attack (TIA), and cerebral infarction without residual deficits: Secondary | ICD-10-CM | POA: Diagnosis not present

## 2013-07-12 LAB — URINALYSIS, ROUTINE W REFLEX MICROSCOPIC
Bilirubin Urine: NEGATIVE
Glucose, UA: NEGATIVE mg/dL
Hgb urine dipstick: NEGATIVE
Ketones, ur: NEGATIVE mg/dL
Nitrite: NEGATIVE
Protein, ur: NEGATIVE mg/dL
Specific Gravity, Urine: 1.017 (ref 1.005–1.030)
Urobilinogen, UA: 0.2 mg/dL (ref 0.0–1.0)
pH: 7.5 (ref 5.0–8.0)

## 2013-07-12 LAB — CBC WITH DIFFERENTIAL/PLATELET
Basophils Absolute: 0 10*3/uL (ref 0.0–0.1)
Basophils Relative: 0 % (ref 0–1)
Eosinophils Absolute: 0 10*3/uL (ref 0.0–0.7)
Eosinophils Relative: 0 % (ref 0–5)
HCT: 44 % (ref 36.0–46.0)
Hemoglobin: 14.8 g/dL (ref 12.0–15.0)
Lymphocytes Relative: 12 % (ref 12–46)
Lymphs Abs: 1.2 10*3/uL (ref 0.7–4.0)
MCH: 31.2 pg (ref 26.0–34.0)
MCHC: 33.6 g/dL (ref 30.0–36.0)
MCV: 92.8 fL (ref 78.0–100.0)
Monocytes Absolute: 0.4 10*3/uL (ref 0.1–1.0)
Monocytes Relative: 4 % (ref 3–12)
Neutro Abs: 8.5 10*3/uL — ABNORMAL HIGH (ref 1.7–7.7)
Neutrophils Relative %: 84 % — ABNORMAL HIGH (ref 43–77)
Platelets: 263 10*3/uL (ref 150–400)
RBC: 4.74 MIL/uL (ref 3.87–5.11)
RDW: 12.9 % (ref 11.5–15.5)
WBC: 10.2 10*3/uL (ref 4.0–10.5)

## 2013-07-12 LAB — URINE MICROSCOPIC-ADD ON

## 2013-07-12 LAB — COMPREHENSIVE METABOLIC PANEL
ALT: 16 U/L (ref 0–35)
AST: 20 U/L (ref 0–37)
Albumin: 3.9 g/dL (ref 3.5–5.2)
Alkaline Phosphatase: 88 U/L (ref 39–117)
BUN: 12 mg/dL (ref 6–23)
CO2: 21 mEq/L (ref 19–32)
Calcium: 9.4 mg/dL (ref 8.4–10.5)
Chloride: 104 mEq/L (ref 96–112)
Creatinine, Ser: 0.84 mg/dL (ref 0.50–1.10)
GFR calc Af Amer: 74 mL/min — ABNORMAL LOW (ref >90)
GFR calc non Af Amer: 63 mL/min — ABNORMAL LOW (ref >90)
Glucose, Bld: 124 mg/dL — ABNORMAL HIGH (ref 70–99)
Potassium: 3.9 mEq/L (ref 3.5–5.1)
Sodium: 137 mEq/L (ref 135–145)
Total Bilirubin: 1 mg/dL (ref 0.3–1.2)
Total Protein: 6.8 g/dL (ref 6.0–8.3)

## 2013-07-12 LAB — LIPASE, BLOOD: Lipase: 37 U/L (ref 11–59)

## 2013-07-12 MED ORDER — IOHEXOL 300 MG/ML  SOLN
50.0000 mL | Freq: Once | INTRAMUSCULAR | Status: AC | PRN
  Administered 2013-07-12: 50 mL via ORAL

## 2013-07-12 MED ORDER — SODIUM CHLORIDE 0.9 % IV BOLUS (SEPSIS)
500.0000 mL | INTRAVENOUS | Status: AC
  Administered 2013-07-12: 500 mL via INTRAVENOUS

## 2013-07-12 MED ORDER — ONDANSETRON HCL 4 MG/2ML IJ SOLN
4.0000 mg | Freq: Once | INTRAMUSCULAR | Status: AC
  Administered 2013-07-12: 4 mg via INTRAVENOUS
  Filled 2013-07-12: qty 2

## 2013-07-12 MED ORDER — IOHEXOL 300 MG/ML  SOLN
100.0000 mL | Freq: Once | INTRAMUSCULAR | Status: AC | PRN
  Administered 2013-07-12: 100 mL via INTRAVENOUS

## 2013-07-12 MED ORDER — ONDANSETRON HCL 4 MG PO TABS: 4.0000 mg | ORAL_TABLET | Freq: Four times a day (QID) | ORAL | Status: DC

## 2013-07-12 NOTE — ED Notes (Signed)
Patient transported to CT 

## 2013-07-12 NOTE — ED Notes (Signed)
Pt states she feels short of breath after walking to restroom, after getting back in bed states "It's getting better now", pt states today has been short of breath which is something new for her.

## 2013-07-12 NOTE — ED Notes (Signed)
Bed: WA18 Expected date:  Expected time:  Means of arrival:  Comments: ems 

## 2013-07-12 NOTE — ED Provider Notes (Signed)
CSN: [id number]     Arrival date & time 07/12/13  1748 History   First MD Initiated Contact with Patient 07/12/13 1753     Chief Complaint  Patient presents with  . Abdominal Pain   (Consider location/radiation/quality/duration/timing/severity/associated sxs/prior Treatment) HPI Pt arrived by EMS from independent nursing home c/o 4 episodes of vomiting today. Pt said she ate lunch around noon (does not remember what she ate) and then began feeling nauseous and had multiple episodes of vomiting. Pt also has abdominal pain. Denies fever, chills, constipation, diarrhea, chest pain, SOB, dysuria, HA, visual changes, LOC. Nothing has made her n/v better.  Patient started Keflex after lunch today. Past Medical History  Diagnosis Date  . Hypertension   . Hyperlipidemia   . Insomnia   . TIA (transient ischemic attack)   . Uterine cancer    Past Surgical History  Procedure Laterality Date  . No surgical history  03/2012   Family History  Problem Relation Age of Onset  . Stroke Father    History  Substance Use Topics  . Smoking status: Never Smoker   . Smokeless tobacco: Never Used  . Alcohol Use: No   OB History   Grav Para Term Preterm Abortions TAB SAB Ect Mult Living                 Review of Systems  Constitutional: Negative for fever and fatigue.  Respiratory: Negative for cough, chest tightness and shortness of breath.   Cardiovascular: Negative for chest pain and palpitations.  Gastrointestinal: Positive for nausea, vomiting and abdominal pain. Negative for diarrhea, constipation and blood in stool.  Genitourinary: Negative for dysuria and frequency.  Neurological: Negative for dizziness and headaches.   All other systems negative except as documented in the HPI. All pertinent positives and negatives as reviewed in the HPI.   Allergies  Review of patient's allergies indicates no known allergies.  Home Medications   Current Outpatient Rx  Name  Route  Sig  Dispense   Refill  . aspirin 325 MG tablet   Oral   Take 325 mg by mouth daily.         Marland Kitchen atorvastatin (LIPITOR) 10 MG tablet   Oral   Take 1 tablet (10 mg total) by mouth daily.   30 tablet   2   . calcium gluconate 500 MG tablet   Oral   Take 500 mg by mouth daily.         . cephALEXin (KEFLEX) 500 MG capsule   Oral   Take 1 capsule (500 mg total) by mouth 2 (two) times daily.   20 capsule   0   . donepezil (ARICEPT) 23 MG TABS tablet   Oral   Take 1 tablet (23 mg total) by mouth at bedtime.   90 tablet   3   . lisinopril (PRINIVIL,ZESTRIL) 10 MG tablet   Oral   Take 10 mg by mouth daily.         . traMADol-acetaminophen (ULTRACET) 37.5-325 MG per tablet   Oral   Take 1 tablet by mouth every 6 (six) hours as needed for pain.          BP 188/85  Pulse 85  Temp(Src) 97.4 F (36.3 C) (Oral)  Resp 18  SpO2 93% Physical Exam  Constitutional: She appears well-developed and well-nourished.  HENT:  Head: Normocephalic and atraumatic.  Mouth/Throat: Uvula is midline, oropharynx is clear and moist and mucous membranes are normal.  Eyes: Conjunctivae and  EOM are normal. Pupils are equal, round, and reactive to light.  Cardiovascular: Normal rate, regular rhythm, normal heart sounds and normal pulses.   Pulmonary/Chest: Effort normal and breath sounds normal.  Abdominal: Soft. Bowel sounds are normal. She exhibits no distension. There is no splenomegaly. There is tenderness in the suprapubic area, left upper quadrant and left lower quadrant. There is no rebound.  Neurological: She is alert.  Skin: Skin is warm, dry and intact.  Psychiatric: She has a normal mood and affect. Her speech is normal and behavior is normal. Judgment normal. Cognition and memory are not impaired. She exhibits normal recent memory.    ED Course  Procedures (including critical care time) Labs Review Labs Reviewed  CBC WITH DIFFERENTIAL - Abnormal; Notable for the following:    Neutrophils  Relative % 84 (*)    Neutro Abs 8.5 (*)    All other components within normal limits  COMPREHENSIVE METABOLIC PANEL - Abnormal; Notable for the following:    Glucose, Bld 124 (*)    GFR calc non Af Amer 63 (*)    GFR calc Af Amer 74 (*)    All other components within normal limits  URINALYSIS, ROUTINE W REFLEX MICROSCOPIC - Abnormal; Notable for the following:    Leukocytes, UA SMALL (*)    All other components within normal limits  LIPASE, BLOOD  URINE MICROSCOPIC-ADD ON  CG4 I-STAT (LACTIC ACID)   Imaging Review Ct Abdomen Pelvis W Contrast  07/12/2013   *RADIOLOGY REPORT*  Clinical Data: Lower abdominal pain, nausea, vomiting, diaphoresis  CT ABDOMEN AND PELVIS WITH CONTRAST  Technique:  Multidetector CT imaging of the abdomen and pelvis was performed following the standard protocol during bolus administration of intravenous contrast.  Contrast: 50mL OMNIPAQUE IOHEXOL 300 MG/ML  SOLN, OMNIPAQUE IOHEXOL 300 MG/ML  SOLN  Comparison: None.  Findings:  Normal hepatic contour.  Sub centimeter (approximate 7 mm) hypoattenuating lesion in the dome of the right lobe of the liver (image 90, series 2) is too small to accurately characterize a favored to represent a hepatic cyst.  Normal appearance of the gallbladder given under distension.  No intra or extrahepatic biliary duct dilatation.  No ascites.  There is symmetric enhancement and excretion of the bilateral kidneys.  No definite renal stones on this postcontrast examination.  There is a sub centimeter (approximate 9 mm) partially exophytic hypoattenuating lesion arising from the inferior pole of the left kidney (image 43, series 2) which is too small to accurately characterize though favored to represent a minimally complex nonenhancing left sided renal cyst.  No discrete right-sided renal lesions.  No urinary obstruction or perinephric stranding.  Normal appearance of the bilateral adrenal glands and spleen. Several small splenules are  incidentally noted.  The pancreas is largely atrophic.  Moderate stool burden within the rectal vault.  Scattered colonic diverticulosis without evidence of diverticulitis.  The colon is largely decompressed, however there is no evidence of enteric obstruction. Scattered colonic diverticulosis without evidence of diverticulitis.  Normal appearance of a diminutive retrocecal appendix.  No pneumoperitoneum, pneumatosis or portal venous gas.  Scattered atherosclerotic plaque within a normal caliber abdominal aorta.  The major branch vessels of the abdominal aorta appear patent on this non CTA examination.  No retroperitoneal, mesenteric, pelvic or inguinal lymphadenopathy.  Normal appearance of the retroverted uterus given age.  No discrete adnexal lesion.  No free fluid in the pelvis.  Limited visualization of the lower thorax demonstrates minimal bibasilar subpleural ground-glass atelectasis.  There is a calcified punctate granuloma within the imaged right lower lobe, the sequela of prior granulomatous infection.  No focal airspace opacities.  No pleural effusions.  Normal heart size.  Coronary calcifications.  Possible calcifications of the aortic valve leaflets.  No pericardial effusion.  No acute or aggressive osseous abnormalities.  Mild to moderate multilevel lumbar spine DDD, likely worse at L5 - S1 with disc space height loss, end plate irregularity and sclerosis.  There is a small Schmorl's node involving the superior endplate of the T12 vertebral body with associated DDD of T11 - T12. Small bilateral mesenteric fat containing inguinal hernias.  IMPRESSION:  1.  Moderate amount of stool within the rectum.  Otherwise, no explanation for patient's lower abdominal pain.  Specifically, no evidence of enteric or urinary obstruction.  2.  Colonic diverticulosis without evidence of diverticulitis.  3.  Coronary calcifications.  Possible calcifications within the aortic valve leaflets.   Original Report Authenticated  By: Tacey Ruiz, MD   Patient's son is given the plan and all the results, along with the patient.  Patient, and her son are advised to stop the Keflex until contacting her doctor tomorrow.  Patient has been stable here in the emergency department MDM      Carlyle Dolly, PA-C 07/12/13 2153

## 2013-07-12 NOTE — ED Notes (Signed)
Per EMS patient with Hx of HTN  And TIA in from independent living for lower abdominal pain, nausea, vomiting, diaphoresis. Per EMS vomit was yellow, no sign of blood. Per EMS patient is A & O x 2; EMS is unsure of baseline A&O status but staff at facility states that patient does have baseline confusion. EMS administered 4 mg Zofran IV.

## 2013-07-13 ENCOUNTER — Ambulatory Visit: Payer: Medicare Other | Admitting: Family Medicine

## 2013-07-13 NOTE — ED Provider Notes (Signed)
Medical screening examination/treatment/procedure(s) were performed by non-physician practitioner and as supervising physician I was immediately available for consultation/collaboration.  Gael Delude R. Trysten Bernard, MD 07/13/13 0014 

## 2013-07-17 DIAGNOSIS — M6281 Muscle weakness (generalized): Secondary | ICD-10-CM | POA: Diagnosis not present

## 2013-07-17 DIAGNOSIS — R269 Unspecified abnormalities of gait and mobility: Secondary | ICD-10-CM | POA: Diagnosis not present

## 2013-07-17 DIAGNOSIS — R279 Unspecified lack of coordination: Secondary | ICD-10-CM | POA: Diagnosis not present

## 2013-07-18 ENCOUNTER — Telehealth: Payer: Self-pay | Admitting: Family Medicine

## 2013-07-18 DIAGNOSIS — S90129A Contusion of unspecified lesser toe(s) without damage to nail, initial encounter: Secondary | ICD-10-CM | POA: Diagnosis not present

## 2013-07-18 DIAGNOSIS — S92919A Unspecified fracture of unspecified toe(s), initial encounter for closed fracture: Secondary | ICD-10-CM | POA: Diagnosis not present

## 2013-07-18 NOTE — Telephone Encounter (Signed)
Please advise 

## 2013-07-18 NOTE — Telephone Encounter (Signed)
It could have been the aricept that caused the N/V----- go back down to 10 mg  #30  11 refills

## 2013-07-18 NOTE — Telephone Encounter (Signed)
Detailed message left as well as asking Jeannett Senior to call the office      KP

## 2013-07-18 NOTE — Telephone Encounter (Signed)
Patient's son, Jeannett Senior, called stating the patient was seen in ED after being prescribed Keflex and increasing dosage of Aricept. She stopped the Keflex, but patient's son would like to know if she should continue taking the higher dose of Aricept. CB# [phone]

## 2013-08-15 DIAGNOSIS — R269 Unspecified abnormalities of gait and mobility: Secondary | ICD-10-CM | POA: Diagnosis not present

## 2013-08-15 DIAGNOSIS — M6281 Muscle weakness (generalized): Secondary | ICD-10-CM | POA: Diagnosis not present

## 2013-08-15 DIAGNOSIS — R279 Unspecified lack of coordination: Secondary | ICD-10-CM | POA: Diagnosis not present

## 2013-08-16 ENCOUNTER — Other Ambulatory Visit: Payer: Self-pay | Admitting: Family Medicine

## 2013-08-16 NOTE — Telephone Encounter (Signed)
Last seen 07/12/13 and filled 03/04/13 #60. Please advise       KP

## 2013-09-02 ENCOUNTER — Other Ambulatory Visit: Payer: Self-pay | Admitting: Family Medicine

## 2013-09-18 DIAGNOSIS — M6281 Muscle weakness (generalized): Secondary | ICD-10-CM | POA: Diagnosis not present

## 2013-09-18 DIAGNOSIS — R279 Unspecified lack of coordination: Secondary | ICD-10-CM | POA: Diagnosis not present

## 2013-09-18 DIAGNOSIS — R269 Unspecified abnormalities of gait and mobility: Secondary | ICD-10-CM | POA: Diagnosis not present

## 2013-09-19 DIAGNOSIS — M6281 Muscle weakness (generalized): Secondary | ICD-10-CM | POA: Diagnosis not present

## 2013-09-19 DIAGNOSIS — R279 Unspecified lack of coordination: Secondary | ICD-10-CM | POA: Diagnosis not present

## 2013-09-19 DIAGNOSIS — R269 Unspecified abnormalities of gait and mobility: Secondary | ICD-10-CM | POA: Diagnosis not present

## 2013-09-20 DIAGNOSIS — M6281 Muscle weakness (generalized): Secondary | ICD-10-CM | POA: Diagnosis not present

## 2013-09-20 DIAGNOSIS — R269 Unspecified abnormalities of gait and mobility: Secondary | ICD-10-CM | POA: Diagnosis not present

## 2013-09-20 DIAGNOSIS — R279 Unspecified lack of coordination: Secondary | ICD-10-CM | POA: Diagnosis not present

## 2013-09-25 DIAGNOSIS — R269 Unspecified abnormalities of gait and mobility: Secondary | ICD-10-CM | POA: Diagnosis not present

## 2013-09-25 DIAGNOSIS — M6281 Muscle weakness (generalized): Secondary | ICD-10-CM | POA: Diagnosis not present

## 2013-09-25 DIAGNOSIS — R279 Unspecified lack of coordination: Secondary | ICD-10-CM | POA: Diagnosis not present

## 2013-09-26 DIAGNOSIS — M6281 Muscle weakness (generalized): Secondary | ICD-10-CM | POA: Diagnosis not present

## 2013-09-26 DIAGNOSIS — R269 Unspecified abnormalities of gait and mobility: Secondary | ICD-10-CM | POA: Diagnosis not present

## 2013-09-26 DIAGNOSIS — R279 Unspecified lack of coordination: Secondary | ICD-10-CM | POA: Diagnosis not present

## 2013-10-01 DIAGNOSIS — R279 Unspecified lack of coordination: Secondary | ICD-10-CM | POA: Diagnosis not present

## 2013-10-01 DIAGNOSIS — R269 Unspecified abnormalities of gait and mobility: Secondary | ICD-10-CM | POA: Diagnosis not present

## 2013-10-01 DIAGNOSIS — M6281 Muscle weakness (generalized): Secondary | ICD-10-CM | POA: Diagnosis not present

## 2013-10-02 DIAGNOSIS — R269 Unspecified abnormalities of gait and mobility: Secondary | ICD-10-CM | POA: Diagnosis not present

## 2013-10-02 DIAGNOSIS — M6281 Muscle weakness (generalized): Secondary | ICD-10-CM | POA: Diagnosis not present

## 2013-10-02 DIAGNOSIS — R279 Unspecified lack of coordination: Secondary | ICD-10-CM | POA: Diagnosis not present

## 2013-10-03 DIAGNOSIS — R269 Unspecified abnormalities of gait and mobility: Secondary | ICD-10-CM | POA: Diagnosis not present

## 2013-10-03 DIAGNOSIS — M6281 Muscle weakness (generalized): Secondary | ICD-10-CM | POA: Diagnosis not present

## 2013-10-03 DIAGNOSIS — R279 Unspecified lack of coordination: Secondary | ICD-10-CM | POA: Diagnosis not present

## 2013-10-04 DIAGNOSIS — R279 Unspecified lack of coordination: Secondary | ICD-10-CM | POA: Diagnosis not present

## 2013-10-04 DIAGNOSIS — M6281 Muscle weakness (generalized): Secondary | ICD-10-CM | POA: Diagnosis not present

## 2013-10-04 DIAGNOSIS — R269 Unspecified abnormalities of gait and mobility: Secondary | ICD-10-CM | POA: Diagnosis not present

## 2013-10-08 DIAGNOSIS — R279 Unspecified lack of coordination: Secondary | ICD-10-CM | POA: Diagnosis not present

## 2013-10-08 DIAGNOSIS — R269 Unspecified abnormalities of gait and mobility: Secondary | ICD-10-CM | POA: Diagnosis not present

## 2013-10-08 DIAGNOSIS — M6281 Muscle weakness (generalized): Secondary | ICD-10-CM | POA: Diagnosis not present

## 2013-10-09 DIAGNOSIS — R279 Unspecified lack of coordination: Secondary | ICD-10-CM | POA: Diagnosis not present

## 2013-10-09 DIAGNOSIS — M6281 Muscle weakness (generalized): Secondary | ICD-10-CM | POA: Diagnosis not present

## 2013-10-09 DIAGNOSIS — R269 Unspecified abnormalities of gait and mobility: Secondary | ICD-10-CM | POA: Diagnosis not present

## 2013-10-10 DIAGNOSIS — R269 Unspecified abnormalities of gait and mobility: Secondary | ICD-10-CM | POA: Diagnosis not present

## 2013-10-10 DIAGNOSIS — R279 Unspecified lack of coordination: Secondary | ICD-10-CM | POA: Diagnosis not present

## 2013-10-10 DIAGNOSIS — M6281 Muscle weakness (generalized): Secondary | ICD-10-CM | POA: Diagnosis not present

## 2013-10-22 ENCOUNTER — Other Ambulatory Visit: Payer: Self-pay | Admitting: Family Medicine

## 2013-11-15 DIAGNOSIS — I639 Cerebral infarction, unspecified: Secondary | ICD-10-CM

## 2013-11-15 HISTORY — DX: Cerebral infarction, unspecified: I63.9

## 2013-12-18 ENCOUNTER — Other Ambulatory Visit: Payer: Self-pay | Admitting: Family Medicine

## 2014-02-04 ENCOUNTER — Other Ambulatory Visit: Payer: Self-pay | Admitting: Family Medicine

## 2014-02-05 ENCOUNTER — Telehealth: Payer: Self-pay | Admitting: *Deleted

## 2014-02-05 NOTE — Telephone Encounter (Signed)
Last seen 07/09/13 and filled 08/16/13 #60. Please advise     KP

## 2014-02-05 NOTE — Telephone Encounter (Signed)
Spoke with pts dtr-in-law and was advised to fax Rx to [facility].

## 2014-02-14 ENCOUNTER — Other Ambulatory Visit: Payer: Self-pay | Admitting: Family Medicine

## 2014-03-04 ENCOUNTER — Other Ambulatory Visit: Payer: Self-pay | Admitting: Family Medicine

## 2014-03-04 NOTE — Telephone Encounter (Signed)
Rx denied because pt is due fasting labs.//AB/CMA

## 2014-04-13 ENCOUNTER — Other Ambulatory Visit: Payer: Self-pay | Admitting: Family Medicine

## 2014-04-15 ENCOUNTER — Encounter: Payer: Self-pay | Admitting: Family Medicine

## 2014-04-15 ENCOUNTER — Ambulatory Visit (INDEPENDENT_AMBULATORY_CARE_PROVIDER_SITE_OTHER): Payer: Medicare Other | Admitting: Family Medicine

## 2014-04-15 VITALS — BP 132/80 | HR 76 | Temp 98.3°F | Wt 165.0 lb

## 2014-04-15 DIAGNOSIS — I10 Essential (primary) hypertension: Secondary | ICD-10-CM | POA: Diagnosis not present

## 2014-04-15 DIAGNOSIS — R7309 Other abnormal glucose: Secondary | ICD-10-CM

## 2014-04-15 DIAGNOSIS — R739 Hyperglycemia, unspecified: Secondary | ICD-10-CM

## 2014-04-15 DIAGNOSIS — E785 Hyperlipidemia, unspecified: Secondary | ICD-10-CM | POA: Diagnosis not present

## 2014-04-15 MED ORDER — ATORVASTATIN CALCIUM 10 MG PO TABS: ORAL_TABLET | ORAL | Status: DC

## 2014-04-15 NOTE — Progress Notes (Signed)
  Subjective:    Patient here for follow-up of elevated blood pressure.  She is not exercising and is adherent to a low-salt diet.  Blood pressure is well controlled at home. Cardiac symptoms: none. Patient denies: chest pain, chest pressure/discomfort, claudication, dyspnea, exertional chest pressure/discomfort, fatigue, irregular heart beat, lower extremity edema, near-syncope, orthopnea, palpitations, paroxysmal nocturnal dyspnea, syncope and tachypnea. Cardiovascular risk factors: dyslipidemia, hypertension and sedentary lifestyle. Use of agents associated with hypertension: none. History of target organ damage: none.  The following portions of the patient's history were reviewed and updated as appropriate: allergies, current medications, past family history, past medical history, past social history, past surgical history and problem list.  Review of Systems Pertinent items are noted in HPI.     Objective:    BP 132/80  Pulse 76  Temp(Src) 98.3 F (36.8 C) (Oral)  Wt 165 lb (74.844 kg)  SpO2 94% General appearance: alert, cooperative, appears stated age and no distress Neck: no adenopathy, no carotid bruit, no JVD, supple, symmetrical, trachea midline and thyroid not enlarged, symmetric, no tenderness/mass/nodules Lungs: clear to auscultation bilaterally Heart: S1, S2 normal Extremities: extremities normal, atraumatic, no cyanosis or edema    Assessment:    Hypertension, normal blood pressure . Evidence of target organ damage: none.    Plan:    Medication: no change. Check blood pressures 2-3 times weekly and record. Follow up: 6 months and as needed. --- labs next week  1. HTN (hypertension) \ - Basic metabolic panel; Future - CBC with Differential; Future - POCT urinalysis dipstick; Future  2. Hyperlipidemia Check labs - Hepatic function panel; Future - Lipid panel; Future - POCT urinalysis dipstick; Future  3. Hyperglycemia Check labs - Hemoglobin A1c;  Future - POCT urinalysis dipstick; Future

## 2014-04-15 NOTE — Patient Instructions (Signed)

## 2014-04-15 NOTE — Progress Notes (Signed)
Pre visit review using our clinic review tool, if applicable. No additional management support is needed unless otherwise documented below in the visit note. 

## 2014-04-16 ENCOUNTER — Telehealth: Payer: Self-pay | Admitting: Family Medicine

## 2014-04-16 NOTE — Telephone Encounter (Signed)
Relevant patient education mailed to patient.  

## 2014-04-22 ENCOUNTER — Other Ambulatory Visit: Payer: Medicare Other

## 2014-04-23 ENCOUNTER — Other Ambulatory Visit (INDEPENDENT_AMBULATORY_CARE_PROVIDER_SITE_OTHER): Payer: Medicare Other

## 2014-04-23 DIAGNOSIS — I10 Essential (primary) hypertension: Secondary | ICD-10-CM | POA: Diagnosis not present

## 2014-04-23 DIAGNOSIS — R7309 Other abnormal glucose: Secondary | ICD-10-CM

## 2014-04-23 DIAGNOSIS — E785 Hyperlipidemia, unspecified: Secondary | ICD-10-CM

## 2014-04-23 DIAGNOSIS — R739 Hyperglycemia, unspecified: Secondary | ICD-10-CM

## 2014-04-23 LAB — CBC WITH DIFFERENTIAL/PLATELET
BASOS PCT: 0.3 % (ref 0.0–3.0)
Basophils Absolute: 0 10*3/uL (ref 0.0–0.1)
EOS ABS: 0.1 10*3/uL (ref 0.0–0.7)
EOS PCT: 1.5 % (ref 0.0–5.0)
HCT: 45.1 % (ref 36.0–46.0)
HEMOGLOBIN: 15.1 g/dL — AB (ref 12.0–15.0)
LYMPHS PCT: 42.3 % (ref 12.0–46.0)
Lymphs Abs: 3 10*3/uL (ref 0.7–4.0)
MCHC: 33.6 g/dL (ref 30.0–36.0)
MCV: 93 fl (ref 78.0–100.0)
MONO ABS: 0.7 10*3/uL (ref 0.1–1.0)
Monocytes Relative: 9.8 % (ref 3.0–12.0)
NEUTROS ABS: 3.3 10*3/uL (ref 1.4–7.7)
Neutrophils Relative %: 46.1 % (ref 43.0–77.0)
Platelets: 256 10*3/uL (ref 150.0–400.0)
RBC: 4.85 Mil/uL (ref 3.87–5.11)
RDW: 13.7 % (ref 11.5–15.5)
WBC: 7.2 10*3/uL (ref 4.0–10.5)

## 2014-04-23 LAB — HEPATIC FUNCTION PANEL
ALT: 16 U/L (ref 0–35)
AST: 21 U/L (ref 0–37)
Albumin: 4.1 g/dL (ref 3.5–5.2)
Alkaline Phosphatase: 75 U/L (ref 39–117)
BILIRUBIN DIRECT: 0.2 mg/dL (ref 0.0–0.3)
BILIRUBIN TOTAL: 1.7 mg/dL — AB (ref 0.2–1.2)
TOTAL PROTEIN: 6.9 g/dL (ref 6.0–8.3)

## 2014-04-23 LAB — LIPID PANEL
CHOL/HDL RATIO: 4
Cholesterol: 187 mg/dL (ref 0–200)
HDL: 44.1 mg/dL (ref >39.00)
LDL CALC: 119 mg/dL — AB (ref 0–99)
NonHDL: 142.9
TRIGLYCERIDES: 118 mg/dL (ref 0.0–149.0)
VLDL: 23.6 mg/dL (ref 0.0–40.0)

## 2014-04-23 LAB — BASIC METABOLIC PANEL
BUN: 16 mg/dL (ref 6–23)
CHLORIDE: 104 meq/L (ref 96–112)
CO2: 27 meq/L (ref 19–32)
Calcium: 9.5 mg/dL (ref 8.4–10.5)
Creatinine, Ser: 1 mg/dL (ref 0.4–1.2)
GFR: 58.41 mL/min — ABNORMAL LOW (ref >60.00)
Glucose, Bld: 99 mg/dL (ref 70–99)
Potassium: 3.6 mEq/L (ref 3.5–5.1)
SODIUM: 140 meq/L (ref 135–145)

## 2014-04-23 LAB — HEMOGLOBIN A1C: Hgb A1c MFr Bld: 5.8 % (ref 4.6–6.5)

## 2014-05-27 ENCOUNTER — Other Ambulatory Visit: Payer: Self-pay

## 2014-05-27 MED ORDER — ATORVASTATIN CALCIUM 10 MG PO TABS: ORAL_TABLET | ORAL | Status: DC

## 2014-05-27 MED ORDER — LISINOPRIL 10 MG PO TABS: 10.0000 mg | ORAL_TABLET | Freq: Every day | ORAL | Status: DC

## 2014-06-28 ENCOUNTER — Emergency Department (HOSPITAL_COMMUNITY): Payer: Medicare Other

## 2014-06-28 ENCOUNTER — Emergency Department (HOSPITAL_COMMUNITY)
Admission: EM | Admit: 2014-06-28 | Discharge: 2014-06-28 | Disposition: A | Discharge status: Home / Self Care | Payer: Medicare Other | Attending: Emergency Medicine | Admitting: Emergency Medicine

## 2014-06-28 ENCOUNTER — Encounter (HOSPITAL_COMMUNITY): Payer: Self-pay | Admitting: Emergency Medicine

## 2014-06-28 DIAGNOSIS — R112 Nausea with vomiting, unspecified: Secondary | ICD-10-CM | POA: Diagnosis not present

## 2014-06-28 DIAGNOSIS — Z8739 Personal history of other diseases of the musculoskeletal system and connective tissue: Secondary | ICD-10-CM | POA: Diagnosis not present

## 2014-06-28 DIAGNOSIS — R197 Diarrhea, unspecified: Secondary | ICD-10-CM | POA: Insufficient documentation

## 2014-06-28 DIAGNOSIS — Z862 Personal history of diseases of the blood and blood-forming organs and certain disorders involving the immune mechanism: Secondary | ICD-10-CM | POA: Insufficient documentation

## 2014-06-28 DIAGNOSIS — I10 Essential (primary) hypertension: Secondary | ICD-10-CM

## 2014-06-28 DIAGNOSIS — K5730 Diverticulosis of large intestine without perforation or abscess without bleeding: Secondary | ICD-10-CM | POA: Diagnosis not present

## 2014-06-28 DIAGNOSIS — R109 Unspecified abdominal pain: Secondary | ICD-10-CM | POA: Insufficient documentation

## 2014-06-28 DIAGNOSIS — Z8639 Personal history of other endocrine, nutritional and metabolic disease: Secondary | ICD-10-CM | POA: Diagnosis not present

## 2014-06-28 DIAGNOSIS — R6889 Other general symptoms and signs: Secondary | ICD-10-CM | POA: Diagnosis not present

## 2014-06-28 DIAGNOSIS — R141 Gas pain: Secondary | ICD-10-CM | POA: Diagnosis not present

## 2014-06-28 HISTORY — DX: Unspecified osteoarthritis, unspecified site: M19.90

## 2014-06-28 LAB — URINALYSIS, ROUTINE W REFLEX MICROSCOPIC
Bilirubin Urine: NEGATIVE
Glucose, UA: NEGATIVE mg/dL
Hgb urine dipstick: NEGATIVE
Ketones, ur: NEGATIVE mg/dL
LEUKOCYTES UA: NEGATIVE
NITRITE: NEGATIVE
PROTEIN: 30 mg/dL — AB
Specific Gravity, Urine: 1.024 (ref 1.005–1.030)
UROBILINOGEN UA: 1 mg/dL (ref 0.0–1.0)
pH: 6 (ref 5.0–8.0)

## 2014-06-28 LAB — COMPREHENSIVE METABOLIC PANEL
ALT: 11 U/L (ref 0–35)
AST: 17 U/L (ref 0–37)
Albumin: 4.1 g/dL (ref 3.5–5.2)
Alkaline Phosphatase: 76 U/L (ref 39–117)
Anion gap: 15 (ref 5–15)
BILIRUBIN TOTAL: 1.9 mg/dL — AB (ref 0.3–1.2)
BUN: 36 mg/dL — ABNORMAL HIGH (ref 6–23)
CHLORIDE: 99 meq/L (ref 96–112)
CO2: 26 meq/L (ref 19–32)
Calcium: 9.8 mg/dL (ref 8.4–10.5)
Creatinine, Ser: 0.82 mg/dL (ref 0.50–1.10)
GFR, EST AFRICAN AMERICAN: 75 mL/min — AB (ref >90)
GFR, EST NON AFRICAN AMERICAN: 65 mL/min — AB (ref >90)
Glucose, Bld: 121 mg/dL — ABNORMAL HIGH (ref 70–99)
Potassium: 4.3 mEq/L (ref 3.7–5.3)
Sodium: 140 mEq/L (ref 137–147)
Total Protein: 7.5 g/dL (ref 6.0–8.3)

## 2014-06-28 LAB — CBC WITH DIFFERENTIAL/PLATELET
Basophils Absolute: 0 10*3/uL (ref 0.0–0.1)
Basophils Relative: 0 % (ref 0–1)
Eosinophils Absolute: 0 10*3/uL (ref 0.0–0.7)
Eosinophils Relative: 0 % (ref 0–5)
HEMATOCRIT: 50 % — AB (ref 36.0–46.0)
Hemoglobin: 17.1 g/dL — ABNORMAL HIGH (ref 12.0–15.0)
Lymphocytes Relative: 20 % (ref 12–46)
Lymphs Abs: 1.9 10*3/uL (ref 0.7–4.0)
MCH: 31.4 pg (ref 26.0–34.0)
MCHC: 34.2 g/dL (ref 30.0–36.0)
MCV: 91.9 fL (ref 78.0–100.0)
MONO ABS: 1.1 10*3/uL — AB (ref 0.1–1.0)
Monocytes Relative: 11 % (ref 3–12)
NEUTROS ABS: 6.7 10*3/uL (ref 1.7–7.7)
Neutrophils Relative %: 69 % (ref 43–77)
Platelets: 223 10*3/uL (ref 150–400)
RBC: 5.44 MIL/uL — ABNORMAL HIGH (ref 3.87–5.11)
RDW: 13.1 % (ref 11.5–15.5)
WBC: 9.8 10*3/uL (ref 4.0–10.5)

## 2014-06-28 LAB — LIPASE, BLOOD: Lipase: 50 U/L (ref 11–59)

## 2014-06-28 LAB — I-STAT TROPONIN, ED: Troponin i, poc: 0 ng/mL (ref 0.00–0.08)

## 2014-06-28 LAB — I-STAT CG4 LACTIC ACID, ED: LACTIC ACID, VENOUS: 2.3 mmol/L — AB (ref 0.5–2.2)

## 2014-06-28 LAB — URINE MICROSCOPIC-ADD ON

## 2014-06-28 MED ORDER — IOHEXOL 300 MG/ML  SOLN
50.0000 mL | Freq: Once | INTRAMUSCULAR | Status: AC | PRN
  Administered 2014-06-28: 50 mL via ORAL

## 2014-06-28 MED ORDER — SODIUM CHLORIDE 0.9 % IV BOLUS (SEPSIS)
1000.0000 mL | Freq: Once | INTRAVENOUS | Status: AC
  Administered 2014-06-28: 1000 mL via INTRAVENOUS

## 2014-06-28 MED ORDER — IOHEXOL 300 MG/ML  SOLN
100.0000 mL | Freq: Once | INTRAMUSCULAR | Status: AC | PRN
  Administered 2014-06-28: 100 mL via INTRAVENOUS

## 2014-06-28 MED ORDER — ONDANSETRON HCL 4 MG/2ML IJ SOLN
4.0000 mg | Freq: Once | INTRAMUSCULAR | Status: AC
  Administered 2014-06-28: 4 mg via INTRAVENOUS
  Filled 2014-06-28: qty 2

## 2014-06-28 MED ORDER — MORPHINE SULFATE 4 MG/ML IJ SOLN
4.0000 mg | Freq: Once | INTRAMUSCULAR | Status: AC
  Administered 2014-06-28: 4 mg via INTRAVENOUS
  Filled 2014-06-28: qty 1

## 2014-06-28 MED ORDER — ONDANSETRON 4 MG PO TBDP: ORAL_TABLET | ORAL | Status: DC

## 2014-06-28 MED ORDER — LISINOPRIL 10 MG PO TABS
10.0000 mg | ORAL_TABLET | Freq: Once | ORAL | Status: AC
  Administered 2014-06-28: 10 mg via ORAL
  Filled 2014-06-28: qty 1

## 2014-06-28 NOTE — ED Notes (Signed)
Patient reports she's unable to urinate at this time.

## 2014-06-28 NOTE — Discharge Instructions (Signed)
Stay hydrated.   Take zofran for nausea.   Take your blood pressure meds as prescribed.   Follow up with your doctor.   Return to ER if you have vomiting, severe pain, headaches, fever.

## 2014-06-28 NOTE — ED Notes (Signed)
Bed: WA08 Expected date:  Expected time:  Means of arrival:  Comments: EMS-N/V

## 2014-06-28 NOTE — ED Notes (Addendum)
Per EMS, pt c/o of n/v for the past 2 days. Pt states she has vomited 3 times today and has not been able to keep food or liquid down. Pt has hx of htn, has been unable to take htn meds due to vomiting. Pt's manual pressure 180/90. Pt alert to person, place, situation.

## 2014-06-28 NOTE — ED Provider Notes (Signed)
CSN: [id number]     Arrival date & time 06/28/14  1248 History   First MD Initiated Contact with Patient 06/28/14 1321     Chief Complaint  Patient presents with  . Nausea  . Emesis     (Consider location/radiation/quality/duration/timing/severity/associated sxs/prior Treatment) The history is provided by the patient and a relative.  Kimberly Craig is a 80 y.o. female hx of HTN, HL here with abdominal pain, vomiting. Has been having nausea vomiting for the last 2 days. Also has some diffuse abdominal pain. Had some diarrhea as well. Has been unable to keep her blood pressure medicine down. Vomited 3 times today. Denies any chest pain or shortness of breath. Denies any fevers. Denies any cardiac history.     Past Medical History  Diagnosis Date  . Hypertension   . Hyperlipidemia   . Arthritis   . Insomnia    History reviewed. No pertinent past surgical history. No family history on file. History  Substance Use Topics  . Smoking status: Never Smoker   . Smokeless tobacco: Not on file  . Alcohol Use: Yes   OB History   Grav Para Term Preterm Abortions TAB SAB Ect Mult Living                 Review of Systems  Gastrointestinal: Positive for vomiting, abdominal pain and diarrhea.  All other systems reviewed and are negative.     Allergies  Review of patient's allergies indicates no known allergies.  Home Medications   Prior to Admission medications   Not on File   BP 176/72  Pulse 63  Temp(Src) 98.3 F (36.8 C) (Oral)  Resp 13  SpO2 96% Physical Exam  Nursing note and vitals reviewed. Constitutional: She is oriented to person, place, and time.  Chronically ill, dehydrated   HENT:  Head: Normocephalic.  MM slightly dry   Eyes: Conjunctivae and EOM are normal. Pupils are equal, round, and reactive to light.  Neck: Normal range of motion. Neck supple.  Cardiovascular: Normal rate, regular rhythm and normal heart sounds.   Pulmonary/Chest: Effort normal and breath  sounds normal. No respiratory distress. She has no wheezes. She has no rales.  Abdominal: Soft. Bowel sounds are normal.  Mild diffuse tenderness, no rebound   Musculoskeletal: Normal range of motion. She exhibits no edema and no tenderness.  Neurological: She is alert and oriented to person, place, and time. No cranial nerve deficit. Coordination normal.  Skin: Skin is warm and dry.  Psychiatric: She has a normal mood and affect. Her behavior is normal. Judgment and thought content normal.    ED Course  Procedures (including critical care time) Labs Review Labs Reviewed  CBC WITH DIFFERENTIAL - Abnormal; Notable for the following:    RBC 5.44 (*)    Hemoglobin 17.1 (*)    HCT 50.0 (*)    Monocytes Absolute 1.1 (*)    All other components within normal limits  COMPREHENSIVE METABOLIC PANEL - Abnormal; Notable for the following:    Glucose, Bld 121 (*)    BUN 36 (*)    Total Bilirubin 1.9 (*)    GFR calc non Af Amer 65 (*)    GFR calc Af Amer 75 (*)    All other components within normal limits  URINALYSIS, ROUTINE W REFLEX MICROSCOPIC - Abnormal; Notable for the following:    Color, Urine AMBER (*)    Protein, ur 30 (*)    All other components within normal limits  I-STAT CG4  LACTIC ACID, ED - Abnormal; Notable for the following:    Lactic Acid, Venous 2.30 (*)    All other components within normal limits  URINE CULTURE  LIPASE, BLOOD  URINE MICROSCOPIC-ADD ON  I-STAT TROPOININ, ED  I-STAT CG4 LACTIC ACID, ED    Imaging Review Ct Abdomen Pelvis W Contrast  06/28/2014   CLINICAL DATA:  Abdominal pain.  Nausea and vomiting.  EXAM: CT ABDOMEN AND PELVIS WITH CONTRAST  TECHNIQUE: Multidetector CT imaging of the abdomen and pelvis was performed using the standard protocol following bolus administration of intravenous contrast.  CONTRAST:  175mL OMNIPAQUE IOHEXOL 300 MG/ML  SOLN  COMPARISON:  None.  FINDINGS: Liver: Small cyst seen in the dome of the right hepatic lobe. No liver  masses identified.  Gallbladder/Biliary:  Unremarkable.  Pancreas: No mass, inflammatory changes, or other parenchymal abnormality identified.  Spleen:  Within normal limits in size and appearance.  Adrenal Glands:  No mass identified.  Kidneys/Urinary Tract: No masses identified. No evidence of hydronephrosis.  Lymph Nodes:  No pathologically enlarged lymph nodes identified.  Bowel: Small hiatal hernia noted. Diverticulosis is seen involving the descending and sigmoid colon. No evidence of diverticulitis.  Pelvic/Reproductive Organs: No mass or other significant abnormality identified.  Vascular:  No evidence of abdominal aortic aneurysm.  Musculoskeletal:  No suspicious bone lesions identified.  Other:  None.  IMPRESSION: Diverticulosis. No radiographic evidence of diverticulitis or other acute findings.  Small hiatal hernia.   Electronically Signed   By: Earle Gell M.D.   On: 06/28/2014 15:44   Dg Abd Acute W/chest  06/28/2014   CLINICAL DATA:  80 year old female with abdominal pain, chest pain, vomiting and diarrhea. Initial encounter.  EXAM: ACUTE ABDOMEN SERIES (ABDOMEN 2 VIEW & CHEST 1 VIEW)  COMPARISON:  None.  FINDINGS: AP view of the chest. Normal cardiac size and mediastinal contours. No pneumothorax or pneumoperitoneum identified. Allowing for AP technique, the lungs appear clear. Visualized tracheal air column is within normal limits.  Left-side-down lateral decubitus view. No pneumoperitoneum identified. Non obstructed bowel gas pattern. Gas and stool in decompressed distal colon. Aortoiliac calcified atherosclerosis noted. No acute osseous abnormality identified. Abdominal and pelvic visceral contours appear within normal limits.  IMPRESSION: 1.  Non obstructed bowel gas pattern, no free air. 2.  No acute cardiopulmonary abnormality.   Electronically Signed   By: Lars Pinks M.D.   On: 06/28/2014 14:41     EKG Interpretation None      Date: 06/28/2014  Rate: 54  Rhythm: normal sinus  rhythm  QRS Axis: normal  Intervals: normal  ST/T Wave abnormalities: nonspecific ST changes  Conduction Disutrbances:none  Narrative Interpretation:   Old EKG Reviewed: none available     MDM   Final diagnoses:  None    Kimberly Craig is a 80 y.o. female here with ab pain, nausea, vomiting. Patient hypertensive and couldn't keep down BP meds. No chest pain or back pain to suggest dissection. Not altered so I doubt stroke. Will get CT ab/pel to r/o intra abdominal pathology. Will get labs and give zofran and reassess.   3:48 PM Felt better. Tolerated PO fluids. BP down to 170s from 210. CT showed no acute findings. Likely gastro. Will d/c home with zofran prn.     Wandra Arthurs, MD 06/28/14 [phone]

## 2014-06-28 NOTE — ED Notes (Signed)
Pt waiting for daughter to come pick her up.

## 2014-07-01 LAB — URINE CULTURE
Colony Count: NO GROWTH
Culture: NO GROWTH

## 2014-07-12 ENCOUNTER — Inpatient Hospital Stay (HOSPITAL_COMMUNITY)
Admission: EM | Admit: 2014-07-12 | Discharge: 2014-07-16 | DRG: 065 | Disposition: A | Discharge status: Skilled Nursing Facility | Payer: Medicare Other | Attending: Internal Medicine | Admitting: Internal Medicine

## 2014-07-12 ENCOUNTER — Emergency Department (HOSPITAL_COMMUNITY): Payer: Medicare Other

## 2014-07-12 ENCOUNTER — Encounter (HOSPITAL_COMMUNITY): Payer: Self-pay | Admitting: Emergency Medicine

## 2014-07-12 DIAGNOSIS — Z8542 Personal history of malignant neoplasm of other parts of uterus: Secondary | ICD-10-CM | POA: Diagnosis not present

## 2014-07-12 DIAGNOSIS — N390 Urinary tract infection, site not specified: Secondary | ICD-10-CM | POA: Diagnosis not present

## 2014-07-12 DIAGNOSIS — I6330 Cerebral infarction due to thrombosis of unspecified cerebral artery: Principal | ICD-10-CM | POA: Diagnosis present

## 2014-07-12 DIAGNOSIS — B9620 Unspecified Escherichia coli [E. coli] as the cause of diseases classified elsewhere: Secondary | ICD-10-CM | POA: Diagnosis present

## 2014-07-12 DIAGNOSIS — Z8673 Personal history of transient ischemic attack (TIA), and cerebral infarction without residual deficits: Secondary | ICD-10-CM

## 2014-07-12 DIAGNOSIS — G510 Bell's palsy: Secondary | ICD-10-CM | POA: Diagnosis not present

## 2014-07-12 DIAGNOSIS — K3189 Other diseases of stomach and duodenum: Secondary | ICD-10-CM | POA: Diagnosis present

## 2014-07-12 DIAGNOSIS — Z7982 Long term (current) use of aspirin: Secondary | ICD-10-CM

## 2014-07-12 DIAGNOSIS — R29810 Facial weakness: Secondary | ICD-10-CM

## 2014-07-12 DIAGNOSIS — R269 Unspecified abnormalities of gait and mobility: Secondary | ICD-10-CM | POA: Diagnosis not present

## 2014-07-12 DIAGNOSIS — A498 Other bacterial infections of unspecified site: Secondary | ICD-10-CM | POA: Diagnosis present

## 2014-07-12 DIAGNOSIS — I639 Cerebral infarction, unspecified: Secondary | ICD-10-CM | POA: Diagnosis present

## 2014-07-12 DIAGNOSIS — I10 Essential (primary) hypertension: Secondary | ICD-10-CM | POA: Diagnosis present

## 2014-07-12 DIAGNOSIS — I6350 Cerebral infarction due to unspecified occlusion or stenosis of unspecified cerebral artery: Secondary | ICD-10-CM | POA: Diagnosis not present

## 2014-07-12 DIAGNOSIS — R1013 Epigastric pain: Secondary | ICD-10-CM

## 2014-07-12 DIAGNOSIS — R4182 Altered mental status, unspecified: Secondary | ICD-10-CM | POA: Diagnosis not present

## 2014-07-12 DIAGNOSIS — I69928 Other speech and language deficits following unspecified cerebrovascular disease: Secondary | ICD-10-CM | POA: Diagnosis not present

## 2014-07-12 DIAGNOSIS — Z823 Family history of stroke: Secondary | ICD-10-CM

## 2014-07-12 DIAGNOSIS — I6359 Cerebral infarction due to unspecified occlusion or stenosis of other cerebral artery: Secondary | ICD-10-CM

## 2014-07-12 DIAGNOSIS — I69991 Dysphagia following unspecified cerebrovascular disease: Secondary | ICD-10-CM | POA: Diagnosis not present

## 2014-07-12 DIAGNOSIS — F0280 Dementia in other diseases classified elsewhere without behavioral disturbance: Secondary | ICD-10-CM | POA: Diagnosis present

## 2014-07-12 DIAGNOSIS — I69992 Facial weakness following unspecified cerebrovascular disease: Secondary | ICD-10-CM | POA: Diagnosis not present

## 2014-07-12 DIAGNOSIS — N3000 Acute cystitis without hematuria: Secondary | ICD-10-CM | POA: Diagnosis not present

## 2014-07-12 DIAGNOSIS — F0390 Unspecified dementia without behavioral disturbance: Secondary | ICD-10-CM | POA: Diagnosis present

## 2014-07-12 DIAGNOSIS — G4700 Insomnia, unspecified: Secondary | ICD-10-CM | POA: Diagnosis not present

## 2014-07-12 DIAGNOSIS — E876 Hypokalemia: Secondary | ICD-10-CM | POA: Diagnosis present

## 2014-07-12 DIAGNOSIS — M6281 Muscle weakness (generalized): Secondary | ICD-10-CM | POA: Diagnosis not present

## 2014-07-12 DIAGNOSIS — G309 Alzheimer's disease, unspecified: Secondary | ICD-10-CM | POA: Diagnosis present

## 2014-07-12 DIAGNOSIS — I69922 Dysarthria following unspecified cerebrovascular disease: Secondary | ICD-10-CM | POA: Diagnosis not present

## 2014-07-12 DIAGNOSIS — E785 Hyperlipidemia, unspecified: Secondary | ICD-10-CM | POA: Diagnosis present

## 2014-07-12 DIAGNOSIS — I6789 Other cerebrovascular disease: Secondary | ICD-10-CM | POA: Diagnosis not present

## 2014-07-12 HISTORY — DX: Unspecified dementia, unspecified severity, without behavioral disturbance, psychotic disturbance, mood disturbance, and anxiety: F03.90

## 2014-07-12 LAB — DIFFERENTIAL
Basophils Absolute: 0 10*3/uL (ref 0.0–0.1)
Basophils Relative: 0 % (ref 0–1)
EOS ABS: 0.1 10*3/uL (ref 0.0–0.7)
Eosinophils Relative: 1 % (ref 0–5)
LYMPHS ABS: 2 10*3/uL (ref 0.7–4.0)
Lymphocytes Relative: 29 % (ref 12–46)
MONO ABS: 0.8 10*3/uL (ref 0.1–1.0)
MONOS PCT: 11 % (ref 3–12)
Neutro Abs: 4 10*3/uL (ref 1.7–7.7)
Neutrophils Relative %: 59 % (ref 43–77)

## 2014-07-12 LAB — COMPREHENSIVE METABOLIC PANEL
ALT: 11 U/L (ref 0–35)
AST: 15 U/L (ref 0–37)
Albumin: 3.5 g/dL (ref 3.5–5.2)
Alkaline Phosphatase: 76 U/L (ref 39–117)
Anion gap: 13 (ref 5–15)
BUN: 9 mg/dL (ref 6–23)
CALCIUM: 9.2 mg/dL (ref 8.4–10.5)
CO2: 28 mEq/L (ref 19–32)
Chloride: 98 mEq/L (ref 96–112)
Creatinine, Ser: 0.81 mg/dL (ref 0.50–1.10)
GFR calc non Af Amer: 66 mL/min — ABNORMAL LOW (ref >90)
GFR, EST AFRICAN AMERICAN: 76 mL/min — AB (ref >90)
Glucose, Bld: 134 mg/dL — ABNORMAL HIGH (ref 70–99)
Potassium: 3 mEq/L — ABNORMAL LOW (ref 3.7–5.3)
SODIUM: 139 meq/L (ref 137–147)
TOTAL PROTEIN: 6.5 g/dL (ref 6.0–8.3)
Total Bilirubin: 0.9 mg/dL (ref 0.3–1.2)

## 2014-07-12 LAB — I-STAT CHEM 8, ED
BUN: 7 mg/dL (ref 6–23)
Calcium, Ion: 1.18 mmol/L (ref 1.13–1.30)
Chloride: 96 mEq/L (ref 96–112)
Creatinine, Ser: 0.9 mg/dL (ref 0.50–1.10)
Glucose, Bld: 124 mg/dL — ABNORMAL HIGH (ref 70–99)
HEMATOCRIT: 47 % — AB (ref 36.0–46.0)
Hemoglobin: 16 g/dL — ABNORMAL HIGH (ref 12.0–15.0)
Potassium: 2.9 mEq/L — CL (ref 3.7–5.3)
Sodium: 141 mEq/L (ref 137–147)
TCO2: 30 mmol/L (ref 0–100)

## 2014-07-12 LAB — CBC
HCT: 45 % (ref 36.0–46.0)
Hemoglobin: 15.4 g/dL — ABNORMAL HIGH (ref 12.0–15.0)
MCH: 31.2 pg (ref 26.0–34.0)
MCHC: 34.2 g/dL (ref 30.0–36.0)
MCV: 91.3 fL (ref 78.0–100.0)
Platelets: 227 10*3/uL (ref 150–400)
RBC: 4.93 MIL/uL (ref 3.87–5.11)
RDW: 13.3 % (ref 11.5–15.5)
WBC: 6.8 10*3/uL (ref 4.0–10.5)

## 2014-07-12 LAB — I-STAT TROPONIN, ED: Troponin i, poc: 0 ng/mL (ref 0.00–0.08)

## 2014-07-12 LAB — PROTIME-INR
INR: 1.07 (ref 0.00–1.49)
Prothrombin Time: 13.9 seconds (ref 11.6–15.2)

## 2014-07-12 LAB — CBG MONITORING, ED: Glucose-Capillary: 125 mg/dL — ABNORMAL HIGH (ref 70–99)

## 2014-07-12 LAB — APTT: aPTT: 32 seconds (ref 24–37)

## 2014-07-12 LAB — ETHANOL

## 2014-07-12 MED ORDER — STROKE: EARLY STAGES OF RECOVERY BOOK
Freq: Once | Status: AC
  Administered 2014-07-12: 21:00:00
  Filled 2014-07-12: qty 1

## 2014-07-12 MED ORDER — CALCIUM GLUCONATE 500 MG PO TABS
500.0000 mg | ORAL_TABLET | Freq: Every day | ORAL | Status: DC
  Administered 2014-07-12 – 2014-07-16 (×5): 500 mg via ORAL
  Filled 2014-07-12 (×5): qty 1

## 2014-07-12 MED ORDER — TRAMADOL-ACETAMINOPHEN 37.5-325 MG PO TABS
1.0000 | ORAL_TABLET | Freq: Four times a day (QID) | ORAL | Status: DC | PRN
  Administered 2014-07-15: 1 via ORAL
  Filled 2014-07-12: qty 1

## 2014-07-12 MED ORDER — DONEPEZIL HCL 23 MG PO TABS
23.0000 mg | ORAL_TABLET | Freq: Every day | ORAL | Status: DC
  Administered 2014-07-12 – 2014-07-15 (×3): 23 mg via ORAL
  Filled 2014-07-12 (×5): qty 1

## 2014-07-12 MED ORDER — ONDANSETRON HCL 4 MG/2ML IJ SOLN
4.0000 mg | Freq: Four times a day (QID) | INTRAMUSCULAR | Status: DC | PRN
Start: 2014-07-12 — End: 2014-07-16
  Administered 2014-07-12: 4 mg via INTRAVENOUS
  Filled 2014-07-12: qty 2

## 2014-07-12 MED ORDER — ATORVASTATIN CALCIUM 10 MG PO TABS
10.0000 mg | ORAL_TABLET | Freq: Every day | ORAL | Status: DC
  Administered 2014-07-12 – 2014-07-16 (×5): 10 mg via ORAL
  Filled 2014-07-12 (×5): qty 1

## 2014-07-12 MED ORDER — FAMOTIDINE 20 MG PO TABS
20.0000 mg | ORAL_TABLET | Freq: Two times a day (BID) | ORAL | Status: DC
  Administered 2014-07-12 – 2014-07-16 (×7): 20 mg via ORAL
  Filled 2014-07-12 (×8): qty 1

## 2014-07-12 MED ORDER — HEPARIN SODIUM (PORCINE) 5000 UNIT/ML IJ SOLN
5000.0000 [IU] | Freq: Three times a day (TID) | INTRAMUSCULAR | Status: DC
  Administered 2014-07-13 – 2014-07-16 (×2): 5000 [IU] via SUBCUTANEOUS
  Filled 2014-07-12 (×5): qty 1

## 2014-07-12 MED ORDER — SIMETHICONE 80 MG PO CHEW
80.0000 mg | CHEWABLE_TABLET | Freq: Four times a day (QID) | ORAL | Status: DC
  Administered 2014-07-12 – 2014-07-16 (×15): 80 mg via ORAL
  Filled 2014-07-12 (×15): qty 1

## 2014-07-12 MED ORDER — LISINOPRIL 10 MG PO TABS
10.0000 mg | ORAL_TABLET | Freq: Every day | ORAL | Status: DC
  Administered 2014-07-12 – 2014-07-15 (×4): 10 mg via ORAL
  Filled 2014-07-12 (×4): qty 1

## 2014-07-12 MED ORDER — POTASSIUM CHLORIDE 10 MEQ/100ML IV SOLN
10.0000 meq | INTRAVENOUS | Status: AC
  Administered 2014-07-12 (×2): 10 meq via INTRAVENOUS
  Filled 2014-07-12 (×3): qty 100

## 2014-07-12 NOTE — ED Notes (Signed)
Code stroke called by to  [facility] called to [place].Marland Kitchenklj

## 2014-07-12 NOTE — ED Notes (Signed)
MD at bedside. 

## 2014-07-12 NOTE — ED Notes (Signed)
Dr Doy Mince aware of low potassium on I stat Chem 8.

## 2014-07-12 NOTE — Progress Notes (Signed)
Patient arrived from [facility] ED. Safety precautions and orders reviewed with patient. Call light within reach. Alarm activated. TELE applied and verified with CCMD. Report to oncoming nurse.   Ave Filter, RN

## 2014-07-12 NOTE — ED Notes (Addendum)
Family reports pt began to have altered LOC today. Last seen normal yesterday. Pt has R facial droop with reddened eye. Pt easily confused. Some trouble articulating words.

## 2014-07-12 NOTE — ED Provider Notes (Signed)
CSN: [id number]     Arrival date & time 07/12/14  1342 History   First MD Initiated Contact with Patient 07/12/14 1405     Chief Complaint  Patient presents with  . Altered Mental Status     (Consider location/radiation/quality/duration/timing/severity/associated sxs/prior Treatment) HPI Comments: 80 yo female with history of dementia presenting with right sided facial drooping and mild dysarthria.    Patient is a 80 y.o. female presenting with neurologic complaint.  Neurologic Problem This is a new problem. Episode onset: unclear, son stated that nursing home staff saw her normal at 10am. The problem occurs constantly. The problem has not changed since onset.Pertinent negatives include no chest pain, no abdominal pain, no headaches and no shortness of breath. Nothing aggravates the symptoms. Nothing relieves the symptoms. She has tried nothing for the symptoms.    Past Medical History  Diagnosis Date  . Hypertension   . Hyperlipidemia   . Insomnia   . TIA (transient ischemic attack)   . Uterine cancer   . Dementia    Past Surgical History  Procedure Laterality Date  . No surgical history  03/2012   Family History  Problem Relation Age of Onset  . Stroke Father    History  Substance Use Topics  . Smoking status: Never Smoker   . Smokeless tobacco: Never Used  . Alcohol Use: No   OB History   Grav Para Term Preterm Abortions TAB SAB Ect Mult Living                 Review of Systems  Respiratory: Negative for shortness of breath.   Cardiovascular: Negative for chest pain.  Gastrointestinal: Negative for abdominal pain.  Neurological: Negative for headaches.  All other systems reviewed and are negative.     Allergies  Review of patient's allergies indicates no known allergies.  Home Medications   Prior to Admission medications   Medication Sig Start Date End Date Taking? Authorizing Provider  aspirin 325 MG tablet Take 325 mg by mouth daily.    Historical  Provider, MD  atorvastatin (LIPITOR) 10 MG tablet 1 tab by mouth daily 05/27/14   Rosalita Chessman, DO  calcium gluconate 500 MG tablet Take 500 mg by mouth daily.    Historical Provider, MD  donepezil (ARICEPT) 23 MG TABS tablet Take 1 tablet (23 mg total) by mouth at bedtime. 07/09/13   Rosalita Chessman, DO  lisinopril (PRINIVIL,ZESTRIL) 10 MG tablet Take 1 tablet (10 mg total) by mouth daily. 05/27/14   Yvonne R Lowne, DO   BP 182/81  Pulse 74  Temp(Src) 98 F (36.7 C) (Oral)  Resp 16  SpO2 100% Physical Exam  Nursing note and vitals reviewed. Constitutional: She is oriented to person, place, and time. She appears well-developed and well-nourished. No distress.  HENT:  Head: Normocephalic and atraumatic.  Mouth/Throat: Oropharynx is clear and moist.  Eyes: Conjunctivae are normal. Pupils are equal, round, and reactive to light. No scleral icterus.  Neck: Neck supple.  Cardiovascular: Normal rate, regular rhythm, normal heart sounds and intact distal pulses.   No murmur heard. Pulmonary/Chest: Effort normal and breath sounds normal. No stridor. No respiratory distress. She has no rales.  Abdominal: Soft. Bowel sounds are normal. She exhibits no distension. There is no tenderness.  Musculoskeletal: Normal range of motion.  Neurological: She is alert and oriented to person, place, and time. A cranial nerve deficit (facial weakness on right, eyebrow raise intact) is present. No sensory deficit. Coordination normal.  Skin: Skin is warm and dry. No rash noted.  Psychiatric: She has a normal mood and affect. Her behavior is normal.    ED Course  Procedures (including critical care time) Labs Review Labs Reviewed  CBC - Abnormal; Notable for the following:    Hemoglobin 15.4 (*)    All other components within normal limits  COMPREHENSIVE METABOLIC PANEL - Abnormal; Notable for the following:    Potassium 3.0 (*)    Glucose, Bld 134 (*)    GFR calc non Af Amer 66 (*)    GFR calc Af Amer  76 (*)    All other components within normal limits  CBG MONITORING, ED - Abnormal; Notable for the following:    Glucose-Capillary 125 (*)    All other components within normal limits  I-STAT CHEM 8, ED - Abnormal; Notable for the following:    Potassium 2.9 (*)    Glucose, Bld 124 (*)    Hemoglobin 16.0 (*)    HCT 47.0 (*)    All other components within normal limits  ETHANOL  PROTIME-INR  APTT  DIFFERENTIAL  URINALYSIS, ROUTINE W REFLEX MICROSCOPIC  URINE RAPID DRUG SCREEN (HOSP PERFORMED)  CBG MONITORING, ED  I-STAT TROPOININ, ED  I-STAT TROPOININ, ED    Imaging Review Ct Head (brain) Wo Contrast  07/12/2014   CLINICAL DATA:  Altered mental status.  EXAM: CT HEAD WITHOUT CONTRAST  TECHNIQUE: Contiguous axial images were obtained from the base of the skull through the vertex without intravenous contrast.  COMPARISON:  MRI, 04/04/2012.  FINDINGS: Ventricles are normal in configuration. There is ventricular and sulcal enlargement reflecting moderate atrophy. There are no parenchymal masses or mass effect. Extensive white matter hypoattenuation is consistent with advanced chronic microvascular ischemic change. There are small lacune infarcts in the deep white matter. An old lacunar infarct is noted in the left central pons. There is no evidence of a recent infarct.  There are no extra-axial masses or abnormal fluid collections.  No intracranial hemorrhage.  Visualized sinuses and mastoid air cells are clear. No skull lesion.  IMPRESSION: 1. No acute intracranial abnormalities. 2. Moderate atrophy, advanced chronic microvascular ischemic change, an old small deep white matter lacune infarcts and old left central pontine infarct.   Electronically Signed   By: Lajean Manes M.D.   On: 07/12/2014 15:03  All radiology studies independently viewed by me.      EKG Interpretation   Date/Time:  Friday July 12 2014 14:10:31 EDT Ventricular Rate:  65 PR Interval:  160 QRS Duration: 78 QT  Interval:  401 QTC Calculation: 417 R Axis:   49 Text Interpretation:  Sinus rhythm Low voltage, precordial leads Baseline  wander in lead(s) II III aVF V6 No significant change was found Confirmed  by [facility]  MD, TREY ([id number]) on 07/12/2014 3:28:59 PM      MDM   Final diagnoses:  Facial weakness    80 yo female presenting with right sided facial droop.  Son last saw normal three days ago, but nursing staff at her facility reports seeing her normal at 10am, about 4 hours PTA.  Code stroke called.  tPA not given secondary to out of window and NIHSS of 2 (see nurses notes).  CT negative.  Plan admit.      Houston Siren III, MD 07/12/14 1536

## 2014-07-12 NOTE — H&P (Addendum)
[facility] History and Physical  Kimberly Craig RXV:[id number] DOB: [birth year] DOA: 07/12/2014   PCP: Garnet Koyanagi, DO    Chief Complaint: right facial droop and trouble speaking  HPI: Kimberly Craig is a 80 y.o. female with a PMH of alzheimer's dementia, HTN, HLP who lives in independent living is brought in her by her son today for a left facial drop and trouble with speaking. He was not present when the symptoms started but it is believed that they began somewhere around 10 AM. The symptoms have not progressed. The patient does not c/o any visual disturbance, numbness or other focal weakness.    General: The patient denies anorexia, fever, weight loss Cardiac: Denies chest pain, syncope, palpitations, pedal edema  Respiratory: Denies cough, shortness of breath, wheezing GI: Denies severe indigestion/heartburn, abdominal pain, +daily nausea after a GI illness 2 wks ago, currently no diarrhea and constipation GU: Denies hematuria, incontinence, dysuria  Musculoskeletal: Denies arthritis  Skin: Denies suspicious skin lesions Neurologic: Denies focal weakness or numbness, change in vision- chronic mild gait instability  Past Medical History  Diagnosis Date  . Hypertension   . Hyperlipidemia   . Insomnia   . TIA (transient ischemic attack)   . Uterine cancer   . Dementia     Past Surgical History  Procedure Laterality Date  . No surgical history  03/2012    Social History: never smoked, occasionally drinks alcohol Lives at [facility] for the past 2 yrs.     No Known Allergies  Family History  Problem Relation Age of Onset  . Stroke Father       Prior to Admission medications   Medication Sig Start Date End Date Taking? Authorizing Provider  aspirin 325 MG tablet Take 325 mg by mouth daily.   Yes Historical Provider, MD  atorvastatin (LIPITOR) 10 MG tablet Take 10 mg by mouth daily.   Yes Historical Provider, MD  calcium gluconate 500 MG  tablet Take 500 mg by mouth daily.   Yes Historical Provider, MD  donepezil (ARICEPT) 23 MG TABS tablet Take 23 mg by mouth at bedtime.   Yes Historical Provider, MD  lisinopril (PRINIVIL,ZESTRIL) 10 MG tablet Take 10 mg by mouth daily.   Yes Historical Provider, MD  traMADol-acetaminophen (ULTRACET) 37.5-325 MG per tablet Take 1 tablet by mouth every 6 (six) hours as needed for moderate pain.   Yes Historical Provider, MD     Physical Exam: Filed Vitals:   07/12/14 1358 07/12/14 1403 07/12/14 1532 07/12/14 1532  BP: 182/81  145/70   Pulse: 74     Temp:  98 F (36.7 C)  98 F (36.7 C)  TempSrc:  Oral    Resp: 16  20   SpO2: 100%        General: AAO x 3, no distress HEENT: Normocephalic and Atraumatic, Mucous membranes pink                PERRLA; EOM intact; No scleral icterus,                 Nares: Patent, Oropharynx: Clear, Fair Dentition                 Neck: FROM, no cervical lymphadenopathy, thyromegaly, carotid bruit or JVD;  Breasts: deferred CHEST WALL: No tenderness  CHEST: Normal respiration, clear to auscultation bilaterally  HEART: Regular rate and rhythm; no murmurs rubs or gallops  BACK: No kyphosis or scoliosis; no CVA tenderness  ABDOMEN: Positive  Bowel Sounds, soft, non-tender; no masses, no organomegaly Rectal Exam: deferred EXTREMITIES: No cyanosis, clubbing, or edema Genitalia: not examined  SKIN:  no rash or ulceration  CNS: Alert and Oriented x 4, right facial droop- although patient and son feel that her speech is not normal for her, I do not note any slurring or trouble in communicating such as word finding or dysarthria-  CN 2-12 intact  Labs on Admission:  Basic Metabolic Panel:  Recent Labs Lab 07/12/14 1407 07/12/14 1420  NA 139 141  K 3.0* 2.9*  CL 98 96  CO2 28  --   GLUCOSE 134* 124*  BUN 9 7  CREATININE 0.81 0.90  CALCIUM 9.2  --    Liver Function Tests:  Recent Labs Lab 07/12/14 1407  AST 15  ALT 11  ALKPHOS 76  BILITOT  0.9  PROT 6.5  ALBUMIN 3.5   No results found for this basename: LIPASE, AMYLASE,  in the last 168 hours No results found for this basename: AMMONIA,  in the last 168 hours CBC:  Recent Labs Lab 07/12/14 1407 07/12/14 1420  WBC 6.8  --   NEUTROABS 4.0  --   HGB 15.4* 16.0*  HCT 45.0 47.0*  MCV 91.3  --   PLT 227  --    Cardiac Enzymes: No results found for this basename: CKTOTAL, CKMB, CKMBINDEX, TROPONINI,  in the last 168 hours  BNP (last 3 results) No results found for this basename: PROBNP,  in the last 8760 hours CBG:  Recent Labs Lab 07/12/14 1402  GLUCAP 125*    Radiological Exams on Admission: Ct Head (brain) Wo Contrast  07/12/2014   CLINICAL DATA:  Altered mental status.  EXAM: CT HEAD WITHOUT CONTRAST  TECHNIQUE: Contiguous axial images were obtained from the base of the skull through the vertex without intravenous contrast.  COMPARISON:  MRI, 04/04/2012.  FINDINGS: Ventricles are normal in configuration. There is ventricular and sulcal enlargement reflecting moderate atrophy. There are no parenchymal masses or mass effect. Extensive white matter hypoattenuation is consistent with advanced chronic microvascular ischemic change. There are small lacune infarcts in the deep white matter. An old lacunar infarct is noted in the left central pons. There is no evidence of a recent infarct.  There are no extra-axial masses or abnormal fluid collections.  No intracranial hemorrhage.  Visualized sinuses and mastoid air cells are clear. No skull lesion.  IMPRESSION: 1. No acute intracranial abnormalities. 2. Moderate atrophy, advanced chronic microvascular ischemic change, an old small deep white matter lacune infarcts and old left central pontine infarct.   Electronically Signed   By: Lajean Manes M.D.   On: 07/12/2014 15:03    EKG: Independently reviewed. Normal sinus rhythm  Assessment/Plan Principal Problem:   CVA (cerebral infarction) - MRI/ MRA brain- ECHO and Carotid  doppler -Neuro consulted by ER - transfer to [facility]  - change ASA to Plavix - PT/ OT/ SLP - cont Statin  Active Problems:   HTN (hypertension) - cont Lisinopril  Nausea/ Indigestion - start Pepcid, Simethicone and PRN Zofran and follow for improvement    Hypokalemia - due to poor PO intake over the past few weeks? - replacing- recheck in AM    Dementia - cont ARicept    Consulted: Neuro  Code Status: Full code Family Communication: with son, Annie Main who is POA  DVT Prophylaxis:SCDs  Time spent: Stevan Born, MD [facility]  If 7PM-7AM, please contact night-coverage [email or web address] 07/12/2014, 4:23 PM

## 2014-07-13 ENCOUNTER — Inpatient Hospital Stay (HOSPITAL_COMMUNITY): Payer: Medicare Other

## 2014-07-13 DIAGNOSIS — E876 Hypokalemia: Secondary | ICD-10-CM

## 2014-07-13 DIAGNOSIS — R29810 Facial weakness: Secondary | ICD-10-CM

## 2014-07-13 DIAGNOSIS — I10 Essential (primary) hypertension: Secondary | ICD-10-CM

## 2014-07-13 DIAGNOSIS — I6350 Cerebral infarction due to unspecified occlusion or stenosis of unspecified cerebral artery: Secondary | ICD-10-CM

## 2014-07-13 DIAGNOSIS — G510 Bell's palsy: Secondary | ICD-10-CM | POA: Diagnosis present

## 2014-07-13 LAB — LIPID PANEL
CHOL/HDL RATIO: 3.2 ratio
Cholesterol: 119 mg/dL (ref 0–200)
HDL: 37 mg/dL — AB (ref >39)
LDL Cholesterol: 61 mg/dL (ref 0–99)
Triglycerides: 106 mg/dL (ref <150)
VLDL: 21 mg/dL (ref 0–40)

## 2014-07-13 LAB — URINALYSIS, ROUTINE W REFLEX MICROSCOPIC
Bilirubin Urine: NEGATIVE
GLUCOSE, UA: NEGATIVE mg/dL
Hgb urine dipstick: NEGATIVE
Ketones, ur: NEGATIVE mg/dL
Nitrite: NEGATIVE
Protein, ur: NEGATIVE mg/dL
SPECIFIC GRAVITY, URINE: 1.006 (ref 1.005–1.030)
UROBILINOGEN UA: 1 mg/dL (ref 0.0–1.0)
pH: 6.5 (ref 5.0–8.0)

## 2014-07-13 LAB — COMPREHENSIVE METABOLIC PANEL
ALBUMIN: 3.4 g/dL — AB (ref 3.5–5.2)
ALT: 12 U/L (ref 0–35)
AST: 15 U/L (ref 0–37)
Alkaline Phosphatase: 72 U/L (ref 39–117)
Anion gap: 10 (ref 5–15)
BUN: 8 mg/dL (ref 6–23)
CALCIUM: 9 mg/dL (ref 8.4–10.5)
CO2: 30 mEq/L (ref 19–32)
CREATININE: 0.81 mg/dL (ref 0.50–1.10)
Chloride: 101 mEq/L (ref 96–112)
GFR calc Af Amer: 76 mL/min — ABNORMAL LOW (ref >90)
GFR, EST NON AFRICAN AMERICAN: 66 mL/min — AB (ref >90)
Glucose, Bld: 101 mg/dL — ABNORMAL HIGH (ref 70–99)
Potassium: 3.7 mEq/L (ref 3.7–5.3)
Sodium: 141 mEq/L (ref 137–147)
Total Bilirubin: 0.9 mg/dL (ref 0.3–1.2)
Total Protein: 6.3 g/dL (ref 6.0–8.3)

## 2014-07-13 LAB — CBC
HCT: 43.8 % (ref 36.0–46.0)
HEMOGLOBIN: 15 g/dL (ref 12.0–15.0)
MCH: 31.1 pg (ref 26.0–34.0)
MCHC: 34.2 g/dL (ref 30.0–36.0)
MCV: 90.9 fL (ref 78.0–100.0)
Platelets: 219 10*3/uL (ref 150–400)
RBC: 4.82 MIL/uL (ref 3.87–5.11)
RDW: 13.6 % (ref 11.5–15.5)
WBC: 6.4 10*3/uL (ref 4.0–10.5)

## 2014-07-13 LAB — RAPID URINE DRUG SCREEN, HOSP PERFORMED
Amphetamines: NOT DETECTED
BARBITURATES: NOT DETECTED
BENZODIAZEPINES: NOT DETECTED
Cocaine: NOT DETECTED
Opiates: NOT DETECTED
TETRAHYDROCANNABINOL: NOT DETECTED

## 2014-07-13 LAB — HEMOGLOBIN A1C
HEMOGLOBIN A1C: 6.2 % — AB (ref <5.7)
MEAN PLASMA GLUCOSE: 131 mg/dL — AB (ref <117)

## 2014-07-13 LAB — URINE MICROSCOPIC-ADD ON

## 2014-07-13 LAB — MAGNESIUM: MAGNESIUM: 2.1 mg/dL (ref 1.5–2.5)

## 2014-07-13 MED ORDER — NAPHAZOLINE HCL 0.1 % OP SOLN
1.0000 [drp] | Freq: Four times a day (QID) | OPHTHALMIC | Status: DC
  Administered 2014-07-13 – 2014-07-15 (×8): 1 [drp] via OPHTHALMIC
  Filled 2014-07-13: qty 15

## 2014-07-13 MED ORDER — POLYVINYL ALCOHOL 1.4 % OP SOLN
1.0000 [drp] | OPHTHALMIC | Status: DC | PRN
  Filled 2014-07-13: qty 15

## 2014-07-13 MED ORDER — CLOPIDOGREL BISULFATE 75 MG PO TABS
75.0000 mg | ORAL_TABLET | Freq: Every day | ORAL | Status: DC
Start: 2014-07-13 — End: 2014-07-16
  Administered 2014-07-13 – 2014-07-16 (×4): 75 mg via ORAL
  Filled 2014-07-13 (×4): qty 1

## 2014-07-13 NOTE — Progress Notes (Signed)
OT Cancellation Note  Patient Details Name: Kimberly Craig MRN: [id number] DOB: [birth year]   Cancelled Treatment:    Reason Eval/Treat Not Completed: Other (comment) (Pt working with PT. will return.)  [place], OTR/L  [phone] 07/13/2014 07/13/2014, 11:19 AM

## 2014-07-13 NOTE — Progress Notes (Signed)
VASCULAR LAB PRELIMINARY  PRELIMINARY  PRELIMINARY  PRELIMINARY  Carotid Dopplers completed.    Preliminary report:  1-39% ICA stenosis.  Vertebral artery flow is antegrade.   Roberto Hlavaty, RVT 07/13/2014, 10:31 AM

## 2014-07-13 NOTE — Progress Notes (Signed)
Pt refused to have labs drawn, stated she had had enough needles today and there was not going to be any more today. Pt educated as to purpose of labs however continued to politely refuse.

## 2014-07-13 NOTE — Evaluation (Signed)
Physical Therapy Evaluation Patient Details Name: Kimberly Craig MRN: [id number] DOB: 12-Oct-1930 Today's Date: 07/13/2014   History of Present Illness  80 yo with hx of Alzheimer's dementia with acute R frontal subcortical white matter infarct. R facial droop.  Clinical Impression  Pt adm from independent living facility due to above. Presents with decline in functional mobility secondary to deficits indicated below. Pt to benefit from skilled acute PT to address deficits and maximize functional mobility. Pt is a high fall risk and due to new deficits is not safe to D/C home without 24/7 (A) at this time.     Follow Up Recommendations SNF;Supervision/Assistance - 24 hour    Equipment Recommendations  None recommended by PT    Recommendations for Other Services       Precautions / Restrictions Precautions Precautions: Fall Restrictions Weight Bearing Restrictions: No      Mobility  Bed Mobility Overal bed mobility: Needs Assistance Bed Mobility: Supine to Sit     Supine to sit: Min assist;HOB elevated Sit to supine: Min assist   General bed mobility comments: (A) to facilitate movement in  Lt UE and LE; and bring up to sitting position with handheld (A)   Transfers Overall transfer level: Needs assistance Equipment used: Rolling walker (2 wheeled) Transfers: Sit to/from Stand Sit to Stand: Min assist Stand pivot transfers: Min assist       General transfer comment: unsteady and heavily leaning posteriorly; cues for safety and hand placement   Ambulation/Gait Ambulation/Gait assistance: Min assist Ambulation Distance (Feet): 12 Feet Assistive device: Rolling walker (2 wheeled) Gait Pattern/deviations: Step-through pattern;Decreased weight shift to left;Decreased step length - left;Trunk flexed;Staggering left Gait velocity: impulsively quick; cues for safety   General Gait Details: pt with heavy posterior leans at times; (A) to maintain balance and manage RW; pt  demo decr safety awareness with gt and taking hands off RW at times; hand over hand (A) for safety   Stairs            Wheelchair Mobility    Modified Rankin (Stroke Patients Only) Modified Rankin (Stroke Patients Only) Pre-Morbid Rankin Score: Moderate disability Modified Rankin: Moderately severe disability     Balance Overall balance assessment: Needs assistance Sitting-balance support: Feet supported Sitting balance-Leahy Scale: Fair   Postural control: Posterior lean Standing balance support: During functional activity;Bilateral upper extremity supported Standing balance-Leahy Scale: Poor Standing balance comment: leaning posteriorly; (A) to maintain balance                              Pertinent Vitals/Pain Pain Assessment: No/denies pain    Home Living Family/patient expects to be discharged to:: Assisted living Living Arrangements: Alone   Type of Home: Independent living facility           Additional Comments: Pt reports sh was independent with self care and that facility assisted with meals and medication mgnt    Prior Function Level of Independence: Needs assistance   Gait / Transfers Assistance Needed: ambulating with RW per son  ADL's / Homemaking Assistance Needed: pt had required incr (A) at independent living center since sickness 2 weeks ago per son  Comments: assistance for meals and medication mgnt     Hand Dominance   Dominant Hand: Right    Extremity/Trunk Assessment   Upper Extremity Assessment: Defer to OT evaluation       LUE Deficits / Details: LUE weakness. @ 4/5 throughout. L inattention  noted   Lower Extremity Assessment: LLE deficits/detail   LLE Deficits / Details: neglectful of Lt LE and UE; hip and quad 3+/5  Cervical / Trunk Assessment: Normal  Communication   Communication: No difficulties  Cognition Arousal/Alertness: Awake/alert Behavior During Therapy: WFL for tasks assessed/performed Overall  Cognitive Status: History of cognitive impairments - at baseline (Alzheimers)       Memory: Decreased short-term memory              General Comments General comments (skin integrity, edema, etc.): son present throughout session     Exercises        Assessment/Plan    PT Assessment Patient needs continued PT services  PT Diagnosis Difficulty walking;Generalized weakness   PT Problem List Decreased strength;Decreased activity tolerance;Decreased balance;Decreased mobility;Decreased cognition;Decreased knowledge of use of DME;Decreased safety awareness  PT Treatment Interventions DME instruction;Gait training;Functional mobility training;Therapeutic activities;Therapeutic exercise;Balance training;Neuromuscular re-education;Patient/family education   PT Goals (Current goals can be found in the Care Plan section) Acute Rehab PT Goals Patient Stated Goal: to get better PT Goal Formulation: With patient Time For Goal Achievement: 07/20/14 Potential to Achieve Goals: Fair    Frequency Min 3X/week   Barriers to discharge Decreased caregiver support living at independent living facility; does not have 24/7 A    Co-evaluation               End of Session Equipment Utilized During Treatment: Gait belt Activity Tolerance: Patient tolerated treatment well Patient left: in chair;with chair alarm set;with call bell/phone within reach;with nursing/sitter in room;with family/visitor present Nurse Communication: Mobility status;Precautions         Time: 9562-1308 PT Time Calculation (min): 27 min   Charges:   PT Evaluation $Initial PT Evaluation Tier I: 1 Procedure PT Treatments $Gait Training: 8-22 mins   PT G CodesMelina Modena McRae, [place] 07/13/2014, 4:08 PM

## 2014-07-13 NOTE — Progress Notes (Signed)
Occupational Therapy Evaluation Patient Details Name: Kimberly Craig MRN: [id number] DOB: 1930/04/04 Today's Date: 07/13/2014    History of Present Illness 80 yo with hx of Alzheimer's dementia with acute R frontal subcortical white matter infarct. R facial droop.   Clinical Impression   PTA, pt lived in [place] at [facility]. Pt currently requires Min A for ADL and mobility. Do not feel pt is safe to D/C back to Independent Living at this time. Feel that pt will benefit from rehab at SNF due to L sided weakness and balance deficits. Discussed possible symptoms of Bell's palsy affecting R face with MD separate from acute CVA symptoms and concern over R eye redness/ use of eye drops. Pt will benefit from skilled OT services to facilitate D/C to next venue due to below deficits.    Follow Up Recommendations  SNF;Supervision/Assistance - 24 hour    Equipment Recommendations  None recommended by OT    Recommendations for Other Services       Precautions / Restrictions Precautions Precautions: Fall  L inattention     Mobility Bed Mobility Overal bed mobility: Needs Assistance Bed Mobility: Supine to Sit;Sit to Supine     Supine to sit: Min assist Sit to supine: Min assist      Transfers Overall transfer level: Needs assistance   Transfers: Sit to/from [employer] Transfers Sit to Stand: Min assist Stand pivot transfers: Min assist       General transfer comment: unsteady. Min A for postuural control    Balance Overall balance assessment: Needs assistance Sitting-balance support: Feet supported;Bilateral upper extremity supported Sitting balance-Leahy Scale: Fair     Standing balance support: Bilateral upper extremity supported;During functional activity Standing balance-Leahy Scale: Poor                              ADL Overall ADL's : Needs assistance/impaired Eating/Feeding: Set up   Grooming: Set up;Sitting   Upper Body  Bathing: Set up;Supervision/ safety;Sitting   Lower Body Bathing: Minimal assistance;Sit to/from stand   Upper Body Dressing : Set up;Supervision/safety   Lower Body Dressing: Minimal assistance;Sit to/from stand   Toilet Transfer: Minimal assistance;BSC   Toileting- Clothing Manipulation and Hygiene: Minimal assistance;Sit to/from stand Toileting - Clothing Manipulation Details (indicate cue type and reason): vc to move clothing out of the way prior to toileting     Functional mobility during ADLs: Minimal assistance General ADL Comments: slow processing for self care     Vision Eye Alignment: Within Functional Limits Alignment/Gaze Preference: Within Defined Limits Ocular Range of Motion: Within Functional Limits Tracking/Visual Pursuits: Able to track stimulus in all quads without difficulty Saccades: Additional eye shifts occurred during testing Convergence: Within functional limits     Additional Comments: Appears to demonstrate CN V palsy   Perception Perception Comments: L inattention   Praxis Praxis Praxis tested?: Deficits Praxis-Other Comments: initiation delays. most likely baseline    Pertinent Vitals/Pain Pain Assessment: No/denies pain     Hand Dominance Right   Extremity/Trunk Assessment Upper Extremity Assessment Upper Extremity Assessment: LUE deficits/detail LUE Deficits / Details: LUE weakness. @ 4/5 throughout. L inattention noted   Lower Extremity Assessment Lower Extremity Assessment: Defer to PT evaluation   Cervical / Trunk Assessment Cervical / Trunk Assessment: Normal   Communication Communication Communication: No difficultieswith the exception of speech affected by R facial weakness   Cognition Arousal/Alertness: Awake/alert Behavior During Therapy: WFL for tasks assessed/performed Overall  Cognitive Status: History of cognitive impairments - at baseline                     General Comments       Exercises        Shoulder Instructions      Home Living Family/patient expects to be discharged to:: Assisted living Living Arrangements: Alone   Type of Home: Independent living facility                           Additional Comments: Pt reports shw was independent with self care and that facility assisted with meals and medication mgnt      Prior Functioning/Environment Level of Independence: Needs assistance  Gait / Transfers Assistance Needed: independent ADL's / Homemaking Assistance Needed: independent   Comments: assistance for meals and medication mgnt    OT Diagnosis: Generalized weakness;Cognitive deficits   OT Problem List: Decreased strength;Decreased activity tolerance;Impaired balance (sitting and/or standing);Decreased coordination;Decreased cognition;Decreased safety awareness;Impaired vision/perception   OT Treatment/Interventions: Self-care/ADL training;Therapeutic exercise;Neuromuscular education;DME and/or AE instruction;Therapeutic activities;Cognitive remediation/compensation;Visual/perceptual remediation/compensation;Patient/family education;Balance training    OT Goals(Current goals can be found in the care plan section) Acute Rehab OT Goals Patient Stated Goal: to get better OT Goal Formulation: Patient unable to participate in goal setting Time For Goal Achievement: 07/27/14 Potential to Achieve Goals: Good  OT Frequency: Min 2X/week   Barriers to D/C:            Co-evaluation              End of Session Equipment Utilized During Treatment: Gait belt Nurse Communication: Mobility status;Other (comment) (concern for R eye - ? need for eyedrops)  Activity Tolerance: Patient tolerated treatment well Patient left: in bed;with call bell/phone within reach;with bed alarm set   Time: 1431-1456 OT Time Calculation (min): 25 min Charges:  OT General Charges $OT Visit: 1 Procedure OT Evaluation $Initial OT Evaluation Tier I: 1 Procedure OT  Treatments $Self Care/Home Management : 8-22 mins G-Codes:    Cuinn Westerhold,HILLARY 2014/08/12, 3:24 PM   [facility], OTR/L  [phone] August 12, 2014

## 2014-07-13 NOTE — Evaluation (Signed)
Speech Language Pathology Evaluation Patient Details Name: Kimberly Craig MRN: [id number] DOB: November 26, 1929 Today's Date: 07/13/2014 Time: 1540-0867 SLP Time Calculation (min): 15 min  Problem List:  Patient Active Problem List   Diagnosis Date Noted  . CVA (cerebral infarction) 07/12/2014  . Hypokalemia 07/12/2014  . Dementia 07/12/2014  . Insomnia 03/01/2012  . B12 deficiency 02/22/2012  . Memory loss 02/09/2012  . Hyperlipidemia 02/08/2012  . HTN (hypertension) 02/08/2012   Past Medical History:  Past Medical History  Diagnosis Date  . Hypertension   . Hyperlipidemia   . Insomnia   . TIA (transient ischemic attack)   . Uterine cancer   . Dementia    Past Surgical History:  Past Surgical History  Procedure Laterality Date  . No surgical history  03/2012   HPI:  80 y.o. female with a PMH of alzheimer's dementia, HTN, HLP who lives in independent living is brought in her by her son with a right facial asymmetry and trouble speaking.  MRI:  Acute RIGHT frontal subcortical white matter infarct. It is unclear if this relates to the patient's RIGHT facial weakness per radiologist's interpretation.    Assessment / Plan / Recommendation Clinical Impression  Pt presents with deficits in cognition that appear to be consistent with baseline function per son, given dx of Alzheimer's dementia: she is intermittently oriented to place [facility], then later stating she is in [place]); able to state she had experienced a stroke; displays poor short-term recall, reduced insight into deficits, and impaired problem-solving.  Notably, pt has focal CN deficit specific to CN VII on right - both upper and lower face are affected, suggesting LMN involvement.  Speech is without significant dysarthria.  Pt will need appropriate support given cognitive deficits - her son, [name], verbalized understanding that this will be necessary and is uncertain about disposition.      SLP Assessment  Patient does not  need any further Speech Language Pathology Services - at baseline   Follow Up Recommendations  [facility] with increased support             Pertinent Vitals/Pain Pain Assessment: No/denies pain   SLP Goals     SLP Evaluation Prior Functioning  Cognitive/Linguistic Baseline: Baseline deficits Baseline deficit details: short-term memory deficits; pt has intermittent nursing assistance throughout the day, including someone who walks her to and from the dining room for meals.   Type of Home: Independent living facility Education: degree in psychology from [employer]  Overall Cognitive Status: History of cognitive impairments - at baseline Arousal/Alertness: Awake/alert Orientation Level: Oriented to person;Oriented to situation;Disoriented to place;Disoriented to time Attention: Sustained Sustained Attention: Appears intact Memory: Impaired Memory Impairment: Storage deficit;Retrieval deficit;Decreased short term memory;Prospective memory Decreased Short Term Memory: Verbal basic;Functional basic Awareness: Impaired Awareness Impairment: Intellectual impairment Problem Solving: Impaired Problem Solving Impairment: Verbal basic;Functional basic Safety/Judgment: Impaired    Comprehension  Auditory Comprehension Overall Auditory Comprehension: Appears within functional limits for tasks assessed Visual Recognition/Discrimination Discrimination: Within Function Limits Reading Comprehension Reading Status: Not tested    Expression Expression Primary Mode of Expression: Verbal Verbal Expression Overall Verbal Expression: Appears within functional limits for tasks assessed   Oral / Motor Oral Motor/Sensory Function Overall Oral Motor/Sensory Function: Other (comment) (asymmetry right lower and upper face, suggesting LMN involve) Motor Speech Overall Motor Speech: Appears within functional limits for tasks assessed (dysarthria  resolved)   Jenene Kauffmann L. Tivis Ringer, [place] CCC/SLP Pager [phone]  Juan Quam Laurice 07/13/2014, 2:04 PM

## 2014-07-13 NOTE — Consult Note (Addendum)
Referring Physician: Dr Grandville Silos    Chief Complaint: stroke  HPI: Kimberly Craig is an 80 y.o. female resident at [facility] with a history of Alzheimer's dementia, hypertension, hyperlipidemia, positive family history of stroke, and a previous TIA. She was brought to the emergency department at [facility] yesterday afternoon by her family after they noticed an altered level of consciousness associated with a right facial droop and mild dysarthria. The patient was last seen normal the day before, 07/11/2014. A CT scan of the head was negative for acute findings. An MRI revealed an acute right frontal subcortical white matter infarct. She was on aspirin 325 mg daily prior to admission but is currently not on antiplatelet therapy. She was admitted for further evaluation. A neurology consult has been requested.    Date last known well: Date: 07/11/2014 Time last known well: Unable to determine tPA Given: No - late presentation.  Past Medical History  Diagnosis Date  . Hypertension   . Hyperlipidemia   . Insomnia   . TIA (transient ischemic attack)   . Uterine cancer   . Dementia     Past Surgical History  Procedure Laterality Date  . No surgical history  03/2012    Family History  Problem Relation Age of Onset  . Stroke Father    Social History:  reports that she has never smoked. She has never used smokeless tobacco. She reports that she does not drink alcohol or use illicit drugs.  Allergies: No Known Allergies  Medications:  Scheduled: . atorvastatin  10 mg Oral Daily  . calcium gluconate  500 mg Oral Daily  . donepezil  23 mg Oral QHS  . famotidine  20 mg Oral BID  . heparin  5,000 Units Subcutaneous 3 times per day  . lisinopril  10 mg Oral Daily  . naphazoline  1 drop Right Eye QID  . simethicone  80 mg Oral QID    ROS: History obtained from the patient (probably unreliable secondary to dementia)  General ROS: negative for - chills, fatigue, fever,  night sweats, weight gain or weight loss Psychological ROS: negative for - behavioral disorder, hallucinations, memory difficulties, mood swings or suicidal ideation Ophthalmic ROS: negative for - blurry vision, double vision, eye pain or loss of vision ENT ROS: negative for - epistaxis, nasal discharge, oral lesions, sore throat, tinnitus or vertigo Allergy and Immunology ROS: negative for - hives or itchy/watery eyes Hematological and Lymphatic ROS: negative for - bleeding problems, bruising or swollen lymph nodes Endocrine ROS: negative for - galactorrhea, hair pattern changes, polydipsia/polyuria or temperature intolerance Respiratory ROS: negative for - cough, hemoptysis, shortness of breath or wheezing Cardiovascular ROS: negative for - chest pain, dyspnea on exertion, edema or irregular heartbeat Gastrointestinal ROS: negative for - abdominal pain, diarrhea, hematemesis, nausea/vomiting or stool incontinence Genito-Urinary ROS: negative for - dysuria, hematuria, incontinence or urinary frequency/urgency Musculoskeletal ROS: negative for - joint swelling or muscular weakness Neurological ROS: as noted in HPI Dermatological ROS: negative for rash and skin lesion changes   Physical Examination: Blood pressure 160/81, pulse 65, temperature 98.6 F (37 C), temperature source Oral, resp. rate 18, height 5' 8.5" (1.74 m), weight 163 lb 12.8 oz (74.3 kg), SpO2 94.00%.  Neurologic Examination: Mental Status: Alert, oriented to place and August - not day or year. Thought content appropriate.  Mild dysarthria. Speech fluent without evidence of aphasia.  Able to follow 3 step commands without difficulty. Cranial Nerves: II: Discs not visualized.; Visual fields grossly  normal, pupils equal, round, reactive to light and accommodation III,IV, VI: mild right ptosis,unable to close right eye completely. Sclera injected and watery. extra-ocular motions intact bilaterally V,VII: Right facial droop,  facial light touch sensation normal bilaterally VIII: hearing normal bilaterally IX,X: gag reflex present XI: bilateral shoulder shrug XII: midline tongue extension Motor: Right : Upper extremity   5/5    Left:     Upper extremity   5/5  Lower extremity   5/5     Lower extremity   5/5 Tone and bulk:normal tone throughout; no atrophy noted Sensory: Pinprick and light touch intact throughout, bilaterally Deep Tendon Reflexes: 2+ and symmetric throughout Plantars: Right: downgoing   Left: downgoing Cerebellar: normal finger-to-nose, normal rapid alternating movements and normal heel-to-shin test Gait: deferred CV: pulses palpable throughout   Laboratory Studies:  Basic Metabolic Panel:  Recent Labs Lab 07/12/14 1407 07/12/14 1420 07/13/14 1132  NA 139 141 141  K 3.0* 2.9* 3.7  CL 98 96 101  CO2 28  --  30  GLUCOSE 134* 124* 101*  BUN 9 7 8   CREATININE 0.81 0.90 0.81  CALCIUM 9.2  --  9.0  MG  --   --  2.1    Liver Function Tests:  Recent Labs Lab 07/12/14 1407 07/13/14 1132  AST 15 15  ALT 11 12  ALKPHOS 76 72  BILITOT 0.9 0.9  PROT 6.5 6.3  ALBUMIN 3.5 3.4*   No results found for this basename: LIPASE, AMYLASE,  in the last 168 hours No results found for this basename: AMMONIA,  in the last 168 hours  CBC:  Recent Labs Lab 07/12/14 1407 07/12/14 1420 07/13/14 1132  WBC 6.8  --  6.4  NEUTROABS 4.0  --   --   HGB 15.4* 16.0* 15.0  HCT 45.0 47.0* 43.8  MCV 91.3  --  90.9  PLT 227  --  219    Cardiac Enzymes: No results found for this basename: CKTOTAL, CKMB, CKMBINDEX, TROPONINI,  in the last 168 hours  BNP: No components found with this basename: POCBNP,   CBG:  Recent Labs Lab 07/12/14 Donna    Microbiology: No results found for this or any previous visit.  Coagulation Studies:  Recent Labs  07/12/14 1408  LABPROT 13.9  INR 1.07    Urinalysis: No results found for this basename: COLORURINE, APPERANCEUR, LABSPEC,  PHURINE, GLUCOSEU, HGBUR, BILIRUBINUR, KETONESUR, PROTEINUR, UROBILINOGEN, NITRITE, LEUKOCYTESUR,  in the last 168 hours  Lipid Panel:    Component Value Date/Time   CHOL 119 07/13/2014 0620   TRIG 106 07/13/2014 0620   HDL 37* 07/13/2014 0620   CHOLHDL 3.2 07/13/2014 0620   VLDL 21 07/13/2014 0620   LDLCALC 61 07/13/2014 0620    HgbA1C:  Lab Results  Component Value Date   HGBA1C 6.2* 07/13/2014    Urine Drug Screen:   No results found for this basename: labopia, cocainscrnur, labbenz, amphetmu, thcu, labbarb    Alcohol Level:  Recent Labs Lab 07/12/14 1408  ETH <11    Other results: EKG: Sinus rhythm rate 65 beats per minute. Please see formal reading for complete details.   Imaging:  Dg Chest 2 View 07/13/2014    No active cardiopulmonary disease.     Ct Head (brain) Wo Contrast 07/12/2014    1. No acute intracranial abnormalities.  2. Moderate atrophy, advanced chronic microvascular ischemic change, an old small deep white matter lacune infarcts and old left central pontine infarct.  Mr Brain Wo Contrast 07/13/2014    Acute RIGHT frontal subcortical white matter infarct. It is unclear if this relates to the patient's RIGHT facial weakness.  Advanced atrophy and small vessel disease.  No proximal stenosis of the carotid, or basilar arteries. Mild to moderate intracranial atherosclerotic change as described.   Mr Jodene Nam Head/brain Wo Cm 07/13/2014    75% stenosis at the P1/P2 junction LEFT PCA. Advanced atrophy and small vessel disease.   Mikey Bussing PA-C [facility] Pager [phone] 07/13/2014, 3:34 PM  Patient seen and examined.  Clinical course and management discussed.  Necessary edits performed.  I agree with the above.  Assessment and plan of care developed and discussed below.  Assessment: 80 y.o. female with Alzheimer's dementia admitted for evaluation of right facial droop and mild dysarthria. MRI of the brain reviewed and shows an acute  RIGHT frontal subcortical white matter infarct.  This is clearly not the cause of the patient's symptoms.  Patient also without symptoms of tearing, sensory deficits, change in taste, tinnitus or difficulty with hearing.  Concern is for another infarct that is MR negative which would then raise concern for embolic causes.  Patient on ASA at home.     Stroke Risk Factors - family history, hyperlipidemia, hypertension and previous TIA  Plan: 1. HgbA1c, fasting lipid panel 2. PT consult, OT consult, Speech consult 3. Echocardiogram 4. Carotid dopplers 5. Prophylactic therapy-Antiplatelet med: Plavix - dose 75 mg daily.  6. Risk factor modification 7. Telemetry monitoring 8. Frequent neuro checks 9. Natural tears and patching of eye at night      Alexis Goodell, MD [facility] [phone]  07/13/2014  5:30 PM

## 2014-07-13 NOTE — Progress Notes (Signed)
[facility] PROGRESS NOTE  SPARKLE AUBE QMG:[id number] DOB: 02-Apr-1930 DOA: 07/12/2014 PCP: Garnet Koyanagi, DO  Assessment/Plan: #1 acute right frontal subcortical white matter infarct CT head negative. Carotid Doppler with preliminary findings negative for significant ICA stenosis. 2-D echo pending.PT/OT. LDL is 61. Continue Lipitor. Continue blood pressure control. Will place on Plavix as this has not been ordered yet. Consult with neurology for further evaluation and management.  #2 ???Bell's palsy Patient with inability to close the right eye with right facial weakness and inability to raise the right eyebrow. Concern for possible Bell's palsy however facial weakness may be secondary to problem #1. Neurology consultation pending. Eyedrops.   #3 hypertension Stable. Continue lisinopril.  #4 hypokalemia Repleted.  #5 dementia Continue Aricept.  #6 prophylaxis Heparin for DVT prophylaxis.  Code Status: full Family Communication: updated patient and son at bedside. Disposition Plan: back to skilled nursing facility when medically stable   Consultants:  Neurology pending  Procedures:  MRI MRA and 07/13/2014  CT head 07/12/2014  Chest x-ray 8/29/ 2015  Carotid Dopplers 07/13/2014  Antibiotics:  none  HPI/Subjective: Patient with no complaints.  Objective: Filed Vitals:   07/13/14 1103  BP: 170/69  Pulse: 59  Temp: 98.6 F (37 C)  Resp: 18   No intake or output data in the 24 hours ending 07/13/14 1124 Filed Weights   07/12/14 2006  Weight: 74.3 kg (163 lb 12.8 oz)    Exam:   General:  NAD. Right facial weakness.  Cardiovascular: RRR  Respiratory: CTAB  Abdomen: soft, nontender, nondistended, positive bowel sounds  Musculoskeletal: no clubbing cyanosis or edema. Patient with 5/5 bilateral upper extremity strength. 5/5 bilateral lower extremity strength.  Neurology: patient with right facial weakness. Inability to fully close the right  eye. Right facial weakness.  Data Reviewed: Basic Metabolic Panel:  Recent Labs Lab 07/12/14 1407 07/12/14 1420  NA 139 141  K 3.0* 2.9*  CL 98 96  CO2 28  --   GLUCOSE 134* 124*  BUN 9 7  CREATININE 0.81 0.90  CALCIUM 9.2  --    Liver Function Tests:  Recent Labs Lab 07/12/14 1407  AST 15  ALT 11  ALKPHOS 76  BILITOT 0.9  PROT 6.5  ALBUMIN 3.5   No results found for this basename: LIPASE, AMYLASE,  in the last 168 hours No results found for this basename: AMMONIA,  in the last 168 hours CBC:  Recent Labs Lab 07/12/14 1407 07/12/14 1420  WBC 6.8  --   NEUTROABS 4.0  --   HGB 15.4* 16.0*  HCT 45.0 47.0*  MCV 91.3  --   PLT 227  --    Cardiac Enzymes: No results found for this basename: CKTOTAL, CKMB, CKMBINDEX, TROPONINI,  in the last 168 hours BNP (last 3 results) No results found for this basename: PROBNP,  in the last 8760 hours CBG:  Recent Labs Lab 07/12/14 1402  GLUCAP 125*    No results found for this or any previous visit (from the past 240 hour(s)).   Studies: Dg Chest 2 View  07/13/2014   CLINICAL DATA:  Stroke symptoms  EXAM: CHEST  2 VIEW  COMPARISON:  None.  FINDINGS: The heart size and mediastinal contours are within normal limits. Both lungs are clear. The visualized skeletal structures are unremarkable.  IMPRESSION: No active cardiopulmonary disease.   Electronically Signed   By: Inez Catalina M.D.   On: 07/13/2014 09:46   Ct Head (brain) Wo Contrast  07/12/2014  CLINICAL DATA:  Altered mental status.  EXAM: CT HEAD WITHOUT CONTRAST  TECHNIQUE: Contiguous axial images were obtained from the base of the skull through the vertex without intravenous contrast.  COMPARISON:  MRI, 04/04/2012.  FINDINGS: Ventricles are normal in configuration. There is ventricular and sulcal enlargement reflecting moderate atrophy. There are no parenchymal masses or mass effect. Extensive white matter hypoattenuation is consistent with advanced chronic  microvascular ischemic change. There are small lacune infarcts in the deep white matter. An old lacunar infarct is noted in the left central pons. There is no evidence of a recent infarct.  There are no extra-axial masses or abnormal fluid collections.  No intracranial hemorrhage.  Visualized sinuses and mastoid air cells are clear. No skull lesion.  IMPRESSION: 1. No acute intracranial abnormalities. 2. Moderate atrophy, advanced chronic microvascular ischemic change, an old small deep white matter lacune infarcts and old left central pontine infarct.   Electronically Signed   By: Lajean Manes M.D.   On: 07/12/2014 15:03   Mr Brain Wo Contrast  07/13/2014   CLINICAL DATA:  RIGHT facial droop.  Difficulty speaking.  EXAM: MRI HEAD WITHOUT CONTRAST  MRA HEAD WITHOUT CONTRAST  TECHNIQUE: Multiplanar, multiecho pulse sequences of the brain and surrounding structures were obtained without intravenous contrast. Angiographic images of the head were obtained using MRA technique without contrast.  COMPARISON:  CT head 07/12/2014  FINDINGS: MRI HEAD FINDINGS  There is a subcentimeter area of acute infarction affecting the RIGHT frontal subcortical white matter (image 22 series 13). No other areas of acute infarction.  No hemorrhage, mass lesion, or extra-axial fluid hydrocephalus ex vacuo. Generalized cerebral and cerebellar atrophy. Moderate to severe T2 and FLAIR hyperintensities in the periventricular and subcortical white matter, also affecting the brainstem, consistent with chronic microvascular ischemic change. Widespread areas of lacunar infarction affect the basal ganglia and brainstem. Prominent perivascular spaces suggesting chronic hypertension.  No significant sinus or mastoid disease. Negative orbits. Sebaceous cyst over the LEFT cheek of no clinical consequence.  Compared with prior CT, this infarct is not visible.  MRA HEAD FINDINGS  The internal carotid arteries are widely patent. The basilar artery is  widely patent with the RIGHT vertebral dominant. LEFT vertebral contributes to the basilar, but is diminutive following the origin of the left PICA. Mild non stenotic irregularity of the proximal anterior cerebral arteries. No proximal or distal MCA stenosis or occlusion. Fetal origin RIGHT PCA. 75% stenosis at the P1/P2 junction LEFT PCA. No cerebellar branch occlusion or intracranial aneurysm.  IMPRESSION: Acute RIGHT frontal subcortical white matter infarct. It is unclear if this relates to the patient's RIGHT facial weakness.  Advanced atrophy and small vessel disease.  No proximal stenosis of the carotid, or basilar arteries. Mild to moderate intracranial atherosclerotic change as described.   Electronically Signed   By: Rolla Flatten M.D.   On: 07/13/2014 10:49   Mr Jodene Nam Head/brain Wo Cm  07/13/2014   CLINICAL DATA:  RIGHT facial droop.  Difficulty speaking.  EXAM: MRI HEAD WITHOUT CONTRAST  MRA HEAD WITHOUT CONTRAST  TECHNIQUE: Multiplanar, multiecho pulse sequences of the brain and surrounding structures were obtained without intravenous contrast. Angiographic images of the head were obtained using MRA technique without contrast.  COMPARISON:  CT head 07/12/2014  FINDINGS: MRI HEAD FINDINGS  There is a subcentimeter area of acute infarction affecting the RIGHT frontal subcortical white matter (image 22 series 13). No other areas of acute infarction.  No hemorrhage, mass lesion, or extra-axial fluid hydrocephalus  ex vacuo. Generalized cerebral and cerebellar atrophy. Moderate to severe T2 and FLAIR hyperintensities in the periventricular and subcortical white matter, also affecting the brainstem, consistent with chronic microvascular ischemic change. Widespread areas of lacunar infarction affect the basal ganglia and brainstem. Prominent perivascular spaces suggesting chronic hypertension.  No significant sinus or mastoid disease. Negative orbits. Sebaceous cyst over the LEFT cheek of no clinical  consequence.  Compared with prior CT, this infarct is not visible.  MRA HEAD FINDINGS  The internal carotid arteries are widely patent. The basilar artery is widely patent with the RIGHT vertebral dominant. LEFT vertebral contributes to the basilar, but is diminutive following the origin of the left PICA. Mild non stenotic irregularity of the proximal anterior cerebral arteries. No proximal or distal MCA stenosis or occlusion. Fetal origin RIGHT PCA. 75% stenosis at the P1/P2 junction LEFT PCA. No cerebellar branch occlusion or intracranial aneurysm.  IMPRESSION: Acute RIGHT frontal subcortical white matter infarct. It is unclear if this relates to the patient's RIGHT facial weakness.  Advanced atrophy and small vessel disease.  No proximal stenosis of the carotid, or basilar arteries. Mild to moderate intracranial atherosclerotic change as described.   Electronically Signed   By: Rolla Flatten M.D.   On: 07/13/2014 10:49    Scheduled Meds: . atorvastatin  10 mg Oral Daily  . calcium gluconate  500 mg Oral Daily  . donepezil  23 mg Oral QHS  . famotidine  20 mg Oral BID  . heparin  5,000 Units Subcutaneous 3 times per day  . lisinopril  10 mg Oral Daily  . simethicone  80 mg Oral QID   Continuous Infusions:   Principal Problem:   CVA (cerebral infarction) Active Problems:   HTN (hypertension)   Hypokalemia   Dementia    Time spent: [place] MD [facility] Pager [phone]. If 7PM-7AM, please contact night-coverage at [email or web address], password [facility] 07/13/2014, 11:24 AM  LOS: 1 day

## 2014-07-14 DIAGNOSIS — N390 Urinary tract infection, site not specified: Secondary | ICD-10-CM

## 2014-07-14 DIAGNOSIS — I6789 Other cerebrovascular disease: Secondary | ICD-10-CM

## 2014-07-14 DIAGNOSIS — N3000 Acute cystitis without hematuria: Secondary | ICD-10-CM

## 2014-07-14 DIAGNOSIS — B9620 Unspecified Escherichia coli [E. coli] as the cause of diseases classified elsewhere: Secondary | ICD-10-CM | POA: Diagnosis present

## 2014-07-14 MED ORDER — DEXTROSE 5 % IV SOLN
1.0000 g | INTRAVENOUS | Status: DC
  Administered 2014-07-14 – 2014-07-16 (×3): 1 g via INTRAVENOUS
  Filled 2014-07-14 (×4): qty 10

## 2014-07-14 NOTE — Progress Notes (Signed)
  Echocardiogram 2D Echocardiogram has been performed.  Kimberly Craig 07/14/2014, 2:58 PM

## 2014-07-14 NOTE — Progress Notes (Signed)
Pt refused for labs to drawn, also refused VTE heparin injection on multiple occassions, On call notified of Heparin refusal , VTE changed to SCD.

## 2014-07-14 NOTE — Progress Notes (Signed)
[facility] PROGRESS NOTE  Kimberly Craig QIH:[id number] DOB: 24-Nov-1929 DOA: 07/12/2014 PCP: Garnet Koyanagi, DO  Assessment/Plan: #1 acute right frontal subcortical white matter infarct CT head negative. Carotid Doppler with preliminary findings negative for significant ICA stenosis. 2-D echo pending.PT/OT. LDL is 61. Continue Lipitor. Continue blood pressure control. Per neurology with findings on MRI are not the cause of patient's symptoms and patient's symptoms likely secondary to another infarct not seen on MRI raising concern for embolic courses. Continue place for secondary stroke prevention. I patch to the right eye at night. Continue Plavix. Neurology following and appreciate input and recommendations.   #3 hypertension Stable. Continue lisinopril.  #4 hypokalemia Repleted.  #5 dementia Continue Aricept.  #6 urinary tract infection Check a urine cultures. Place on IV Rocephin.  #7 prophylaxis Heparin for DVT prophylaxis.  Code Status: full Family Communication: updated patient and son at bedside. Disposition Plan: back to skilled nursing facility when medically stable   Consultants:  Neurology Dr. Doy Mince 07/13/2014  Procedures:  MRI MRA and 07/13/2014  CT head 07/12/2014  Chest x-ray 8/29/ 2015  Carotid Dopplers 07/13/2014  2 d echo pending  Antibiotics:  none  HPI/Subjective: Patient with no complaints. Patient eating breakfast and a cookie.  Objective: Filed Vitals:   07/14/14 0613  BP: 181/79  Pulse: 63  Temp: 98.6 F (37 C)  Resp: 18    Intake/Output Summary (Last 24 hours) at 07/14/14 1018 Last data filed at 07/13/14 1743  Gross per 24 hour  Intake    600 ml  Output      0 ml  Net    600 ml   Filed Weights   07/12/14 2006  Weight: 74.3 kg (163 lb 12.8 oz)    Exam:   General:  NAD. Right facial weakness.  Cardiovascular: RRR  Respiratory: CTAB  Abdomen: soft, nontender, nondistended, positive bowel  sounds  Musculoskeletal: no clubbing cyanosis or edema. Patient with 5/5 bilateral upper extremity strength. 5/5 bilateral lower extremity strength.  Neurology: patient with right facial weakness. Inability to fully close the right eye. Right facial weakness.  Data Reviewed: Basic Metabolic Panel:  Recent Labs Lab 07/12/14 1407 07/12/14 1420 07/13/14 1132  NA 139 141 141  K 3.0* 2.9* 3.7  CL 98 96 101  CO2 28  --  30  GLUCOSE 134* 124* 101*  BUN 9 7 8   CREATININE 0.81 0.90 0.81  CALCIUM 9.2  --  9.0  MG  --   --  2.1   Liver Function Tests:  Recent Labs Lab 07/12/14 1407 07/13/14 1132  AST 15 15  ALT 11 12  ALKPHOS 76 72  BILITOT 0.9 0.9  PROT 6.5 6.3  ALBUMIN 3.5 3.4*   No results found for this basename: LIPASE, AMYLASE,  in the last 168 hours No results found for this basename: AMMONIA,  in the last 168 hours CBC:  Recent Labs Lab 07/12/14 1407 07/12/14 1420 07/13/14 1132  WBC 6.8  --  6.4  NEUTROABS 4.0  --   --   HGB 15.4* 16.0* 15.0  HCT 45.0 47.0* 43.8  MCV 91.3  --  90.9  PLT 227  --  219   Cardiac Enzymes: No results found for this basename: CKTOTAL, CKMB, CKMBINDEX, TROPONINI,  in the last 168 hours BNP (last 3 results) No results found for this basename: PROBNP,  in the last 8760 hours CBG:  Recent Labs Lab 07/12/14 1402  GLUCAP 125*    No results found for this  or any previous visit (from the past 240 hour(s)).   Studies: Dg Chest 2 View  07/13/2014   CLINICAL DATA:  Stroke symptoms  EXAM: CHEST  2 VIEW  COMPARISON:  None.  FINDINGS: The heart size and mediastinal contours are within normal limits. Both lungs are clear. The visualized skeletal structures are unremarkable.  IMPRESSION: No active cardiopulmonary disease.   Electronically Signed   By: Inez Catalina M.D.   On: 07/13/2014 09:46   Ct Head (brain) Wo Contrast  07/12/2014   CLINICAL DATA:  Altered mental status.  EXAM: CT HEAD WITHOUT CONTRAST  TECHNIQUE: Contiguous axial  images were obtained from the base of the skull through the vertex without intravenous contrast.  COMPARISON:  MRI, 04/04/2012.  FINDINGS: Ventricles are normal in configuration. There is ventricular and sulcal enlargement reflecting moderate atrophy. There are no parenchymal masses or mass effect. Extensive white matter hypoattenuation is consistent with advanced chronic microvascular ischemic change. There are small lacune infarcts in the deep white matter. An old lacunar infarct is noted in the left central pons. There is no evidence of a recent infarct.  There are no extra-axial masses or abnormal fluid collections.  No intracranial hemorrhage.  Visualized sinuses and mastoid air cells are clear. No skull lesion.  IMPRESSION: 1. No acute intracranial abnormalities. 2. Moderate atrophy, advanced chronic microvascular ischemic change, an old small deep white matter lacune infarcts and old left central pontine infarct.   Electronically Signed   By: Lajean Manes M.D.   On: 07/12/2014 15:03   Mr Brain Wo Contrast  07/13/2014   CLINICAL DATA:  RIGHT facial droop.  Difficulty speaking.  EXAM: MRI HEAD WITHOUT CONTRAST  MRA HEAD WITHOUT CONTRAST  TECHNIQUE: Multiplanar, multiecho pulse sequences of the brain and surrounding structures were obtained without intravenous contrast. Angiographic images of the head were obtained using MRA technique without contrast.  COMPARISON:  CT head 07/12/2014  FINDINGS: MRI HEAD FINDINGS  There is a subcentimeter area of acute infarction affecting the RIGHT frontal subcortical white matter (image 22 series 13). No other areas of acute infarction.  No hemorrhage, mass lesion, or extra-axial fluid hydrocephalus ex vacuo. Generalized cerebral and cerebellar atrophy. Moderate to severe T2 and FLAIR hyperintensities in the periventricular and subcortical white matter, also affecting the brainstem, consistent with chronic microvascular ischemic change. Widespread areas of lacunar  infarction affect the basal ganglia and brainstem. Prominent perivascular spaces suggesting chronic hypertension.  No significant sinus or mastoid disease. Negative orbits. Sebaceous cyst over the LEFT cheek of no clinical consequence.  Compared with prior CT, this infarct is not visible.  MRA HEAD FINDINGS  The internal carotid arteries are widely patent. The basilar artery is widely patent with the RIGHT vertebral dominant. LEFT vertebral contributes to the basilar, but is diminutive following the origin of the left PICA. Mild non stenotic irregularity of the proximal anterior cerebral arteries. No proximal or distal MCA stenosis or occlusion. Fetal origin RIGHT PCA. 75% stenosis at the P1/P2 junction LEFT PCA. No cerebellar branch occlusion or intracranial aneurysm.  IMPRESSION: Acute RIGHT frontal subcortical white matter infarct. It is unclear if this relates to the patient's RIGHT facial weakness.  Advanced atrophy and small vessel disease.  No proximal stenosis of the carotid, or basilar arteries. Mild to moderate intracranial atherosclerotic change as described.   Electronically Signed   By: Rolla Flatten M.D.   On: 07/13/2014 10:49   Mr Jodene Nam Head/brain Wo Cm  07/13/2014   CLINICAL DATA:  RIGHT facial  droop.  Difficulty speaking.  EXAM: MRI HEAD WITHOUT CONTRAST  MRA HEAD WITHOUT CONTRAST  TECHNIQUE: Multiplanar, multiecho pulse sequences of the brain and surrounding structures were obtained without intravenous contrast. Angiographic images of the head were obtained using MRA technique without contrast.  COMPARISON:  CT head 07/12/2014  FINDINGS: MRI HEAD FINDINGS  There is a subcentimeter area of acute infarction affecting the RIGHT frontal subcortical white matter (image 22 series 13). No other areas of acute infarction.  No hemorrhage, mass lesion, or extra-axial fluid hydrocephalus ex vacuo. Generalized cerebral and cerebellar atrophy. Moderate to severe T2 and FLAIR hyperintensities in the  periventricular and subcortical white matter, also affecting the brainstem, consistent with chronic microvascular ischemic change. Widespread areas of lacunar infarction affect the basal ganglia and brainstem. Prominent perivascular spaces suggesting chronic hypertension.  No significant sinus or mastoid disease. Negative orbits. Sebaceous cyst over the LEFT cheek of no clinical consequence.  Compared with prior CT, this infarct is not visible.  MRA HEAD FINDINGS  The internal carotid arteries are widely patent. The basilar artery is widely patent with the RIGHT vertebral dominant. LEFT vertebral contributes to the basilar, but is diminutive following the origin of the left PICA. Mild non stenotic irregularity of the proximal anterior cerebral arteries. No proximal or distal MCA stenosis or occlusion. Fetal origin RIGHT PCA. 75% stenosis at the P1/P2 junction LEFT PCA. No cerebellar branch occlusion or intracranial aneurysm.  IMPRESSION: Acute RIGHT frontal subcortical white matter infarct. It is unclear if this relates to the patient's RIGHT facial weakness.  Advanced atrophy and small vessel disease.  No proximal stenosis of the carotid, or basilar arteries. Mild to moderate intracranial atherosclerotic change as described.   Electronically Signed   By: Rolla Flatten M.D.   On: 07/13/2014 10:49    Scheduled Meds: . atorvastatin  10 mg Oral Daily  . calcium gluconate  500 mg Oral Daily  . cefTRIAXone (ROCEPHIN)  IV  1 g Intravenous Q24H  . clopidogrel  75 mg Oral Daily  . donepezil  23 mg Oral QHS  . famotidine  20 mg Oral BID  . heparin  5,000 Units Subcutaneous 3 times per day  . lisinopril  10 mg Oral Daily  . naphazoline  1 drop Right Eye QID  . simethicone  80 mg Oral QID   Continuous Infusions:   Principal Problem:   CVA (cerebral infarction) Active Problems:   HTN (hypertension)   Hypokalemia   Dementia   UTI (urinary tract infection)    Time spent: [place] MD [facility] Pager [phone]. If 7PM-7AM, please contact night-coverage at [email or web address], password [facility] 07/14/2014, 10:18 AM  LOS: 2 days

## 2014-07-14 NOTE — Progress Notes (Signed)
STROKE TEAM PROGRESS NOTE   HISTORY Kimberly Craig is an 80 y.o. female resident at [facility] with a history of Alzheimer's dementia, hypertension, hyperlipidemia, positive family history of stroke, and a previous TIA. She was brought to the emergency department at [facility] yesterday afternoon by her family after they noticed an altered level of consciousness associated with a right facial droop and mild dysarthria. The patient was last seen normal the day before, 07/11/2014. A CT scan of the head was negative for acute findings. An MRI revealed an acute right frontal subcortical white matter infarct. She was on aspirin 325 mg daily prior to admission but is currently not on antiplatelet therapy. She was admitted for further evaluation. A neurology consult has been requested.   Date last known well: Date: 07/11/2014  Time last known well: Unable to determine  tPA Given: No - late presentation.    SUBJECTIVE (INTERVAL HISTORY) She still complains of right facial weakness. She also complains of pain involving the left upper extremity especially the hand and forearm.  OBJECTIVE Temp:  [98.2 F (36.8 C)-98.8 F (37.1 C)] 98.6 F (37 C) (08/30 0613) Pulse Rate:  [59-71] 63 (08/30 0613) Cardiac Rhythm:  [-] Normal sinus rhythm (08/29 2100) Resp:  [16-18] 18 (08/30 0613) BP: (145-181)/(68-93) 181/79 mmHg (08/30 0613) SpO2:  [91 %-100 %] 94 % (08/30 0613)   Recent Labs Lab 07/12/14 1402  GLUCAP 125*    Recent Labs Lab 07/12/14 1407 07/12/14 1420 07/13/14 1132  NA 139 141 141  K 3.0* 2.9* 3.7  CL 98 96 101  CO2 28  --  30  GLUCOSE 134* 124* 101*  BUN 9 7 8   CREATININE 0.81 0.90 0.81  CALCIUM 9.2  --  9.0  MG  --   --  2.1    Recent Labs Lab 07/12/14 1407 07/13/14 1132  AST 15 15  ALT 11 12  ALKPHOS 76 72  BILITOT 0.9 0.9  PROT 6.5 6.3  ALBUMIN 3.5 3.4*    Recent Labs Lab 07/12/14 1407 07/12/14 1420 07/13/14 1132  WBC 6.8  --  6.4  NEUTROABS  4.0  --   --   HGB 15.4* 16.0* 15.0  HCT 45.0 47.0* 43.8  MCV 91.3  --  90.9  PLT 227  --  219   No results found for this basename: CKTOTAL, CKMB, CKMBINDEX, TROPONINI,  in the last 168 hours  Recent Labs  07/12/14 1408  LABPROT 13.9  INR 1.07    Recent Labs  07/13/14 1946  [place] 1.006  PHURINE 6.5  GLUCOSEU NEGATIVE  HGBUR NEGATIVE  BILIRUBINUR NEGATIVE  KETONESUR NEGATIVE  PROTEINUR NEGATIVE  UROBILINOGEN 1.0  NITRITE NEGATIVE  LEUKOCYTESUR MODERATE*       Component Value Date/Time   CHOL 119 07/13/2014 0620   TRIG 106 07/13/2014 0620   HDL 37* 07/13/2014 0620   CHOLHDL 3.2 07/13/2014 0620   VLDL 21 07/13/2014 0620   LDLCALC 61 07/13/2014 0620   Lab Results  Component Value Date   HGBA1C 6.2* 07/13/2014      Component Value Date/Time   LABOPIA NONE DETECTED 07/13/2014 1946   COCAINSCRNUR NONE DETECTED 07/13/2014 1946   LABBENZ NONE DETECTED 07/13/2014 1946   AMPHETMU NONE DETECTED 07/13/2014 1946   THCU NONE DETECTED 07/13/2014 1946   LABBARB NONE DETECTED 07/13/2014 1946     Recent Labs Lab 07/12/14 1408  ETH <11    Dg Chest 2 View 07/13/2014    No  active cardiopulmonary disease.     Mr Brain Wo Contrast 07/13/2014    Acute RIGHT frontal subcortical white matter infarct. It is unclear if this relates to the patient's RIGHT facial weakness.  Advanced atrophy and small vessel disease.       Mr Jodene Nam Head/brain Wo Cm 07/13/2014    No proximal stenosis of the carotid, or basilar arteries. Mild to moderate intracranial atherosclerotic change as described.       PHYSICAL EXAM  GENERAL: In no acute distress.  HEENT: Supple. Atraumatic normocephalic.   ABDOMEN: soft  EXTREMITIES: No edema   BACK: Normal.  SKIN: Normal by inspection.    MENTAL STATUS: Alert and lucid but at times resistant to the evaluation. There appears to be some mild cognitive impairment at baseline. She complains of significant pain involving the left upper  extremity.   CRANIAL NERVES: Pupils are equal, round and reactive to light and accommodation; extra ocular movements are full, there is no significant nystagmus; visual fields are full; Significant weakness of the right facial muscles with lower facial muscles still more affected being about 0/5 and the upper facial being about 3/5; tongue is midline; uvula is midline; shoulder elevation is normal.  MOTOR: Some difficulty with examination due to lack of cooperation at times. Legs are 5/5. Right upper extremity at least 4+. Left upper extremity 3/5 but she does have significant pain.  COORDINATION: Left finger to nose is normal, right finger to nose is normal, No rest tremor; no intention tremor; no postural tremor; no bradykinesia.  SENSATION: Normal to light pain.   Brain MRI is reviewed in person by Dr. Merlene Laughter there is marked global atrophy. There is a small deep right plantar infarcts seen in in the right frontal area. No other infarcts are seen acutely. There are chronic bilateral basilar ganglia infarcts and lacunar type. There is also a Chronic large acute infarct seen in the left pontine area. No hemorrhages seen.    ASSESSMENT/PLAN  Kimberly Craig is a 80 y.o. female with hx of Alzheimer's dementia, hypertension, hyperlipidemia, positive family history stroke, and previous TIA  presenting with right facial droop and mild dysarthria ( This finding does not cooperate with the acute MRI findings--- however, there is a chronic large lacunar infarcts involving the left pontine infarct which could explain the right facial droop). Exam now shows a new left upper extremity monoparesis not present on initial presentation.  She did not receive IV t-PA  due to late presentation and mild deficits. MRI - Acute RIGHT frontal subcortical white matter infarct. Stroke work up underway.  Stroke - Acute RIGHT frontal subcortical white matter infarct. Thrombotic secondary to small vessel disease versus  Cardioembolic.  aspirin 325 mg orally every day prior to admission, now on clopidogrel 75 mg orally every day  MRI - as above  MRA - as above  2D Echo - pending  Carotid - pending  LDL 61, no statin necessary as meeting goal of LDL <100  HgbA1c 6.2   Subcutaneous heparin for VTE prophylaxis  Cardiac thin liquids.   Up with assistance  Resultant - resolution of deficits  Therapy needs: - No further therapy is recommended. Patient needs a 24-hour per day supervision / assistance.  Risk factor management/education  Disposition:  SNF  On Tele- NSR so far.  Hypertension   Home meds:  Lisinopril. Resumed in hospital  BP  (145-181)/(68-93) 181/79 mmHg (08/30 8413) past 24h  SBP goal < 180  Stable   Hyperlipidemia  Home meds:  Lipitor 10 mg daily. Resumed in hospital.  LDL 61  LDL goal < 100 (<70 for diabetics)  Other Stroke Risk Factors Advanced age Family history of stroke in her father Hx of TIA  Other Active Problems  Alzheimer's dementia  Other Pertinent History    Hospital day # 2  Lowry Ram [facility] Pager [phone] 07/14/2014, 2:09 PM  The patient was seen and examined by me; notes, chart and tests reviewed and discussed with midlevel provider, other providers, patient, and family.      To contact Stroke Continuity provider, please refer to [email or web address] After hours, contact General Neurology

## 2014-07-15 DIAGNOSIS — G510 Bell's palsy: Secondary | ICD-10-CM | POA: Diagnosis present

## 2014-07-15 MED ORDER — POLYVINYL ALCOHOL 1.4 % OP SOLN
1.0000 [drp] | OPHTHALMIC | Status: DC | PRN
  Filled 2014-07-15: qty 15

## 2014-07-15 MED ORDER — PREDNISONE 20 MG PO TABS
60.0000 mg | ORAL_TABLET | Freq: Every day | ORAL | Status: DC
Start: 2014-07-15 — End: 2014-07-16
  Administered 2014-07-15 – 2014-07-16 (×2): 60 mg via ORAL
  Filled 2014-07-15 (×2): qty 3

## 2014-07-15 MED ORDER — ARTIFICIAL TEARS OP OINT
TOPICAL_OINTMENT | Freq: Every day | OPHTHALMIC | Status: DC
  Administered 2014-07-15: 1 via OPHTHALMIC
  Filled 2014-07-15: qty 3.5

## 2014-07-15 MED ORDER — LISINOPRIL 20 MG PO TABS
20.0000 mg | ORAL_TABLET | Freq: Every day | ORAL | Status: DC
  Administered 2014-07-16: 20 mg via ORAL
  Filled 2014-07-15: qty 1

## 2014-07-15 MED ORDER — LISINOPRIL 10 MG PO TABS
10.0000 mg | ORAL_TABLET | Freq: Once | ORAL | Status: AC
  Administered 2014-07-15: 10 mg via ORAL
  Filled 2014-07-15: qty 1

## 2014-07-15 MED ORDER — HYDRALAZINE HCL 20 MG/ML IJ SOLN
10.0000 mg | Freq: Once | INTRAMUSCULAR | Status: AC
  Administered 2014-07-15: 10 mg via INTRAVENOUS
  Filled 2014-07-15: qty 1

## 2014-07-15 MED ORDER — POLYVINYL ALCOHOL 1.4 % OP SOLN
1.0000 [drp] | Freq: Three times a day (TID) | OPHTHALMIC | Status: DC
  Administered 2014-07-15 – 2014-07-16 (×3): 1 [drp] via OPHTHALMIC
  Filled 2014-07-15: qty 15

## 2014-07-15 MED ORDER — VALACYCLOVIR HCL 500 MG PO TABS
1000.0000 mg | ORAL_TABLET | Freq: Three times a day (TID) | ORAL | Status: DC
  Administered 2014-07-15 – 2014-07-16 (×3): 1000 mg via ORAL
  Filled 2014-07-15 (×4): qty 2

## 2014-07-15 MED ORDER — ARTIFICIAL TEARS OP OINT: TOPICAL_OINTMENT | Freq: Every day | OPHTHALMIC | Status: DC

## 2014-07-15 NOTE — Progress Notes (Signed)
STROKE TEAM PROGRESS NOTE   HISTORY Kimberly Craig is an 80 y.o. female resident at [facility] with a history of Alzheimer's dementia, hypertension, hyperlipidemia, positive family history of stroke, and a previous TIA. She was brought to the emergency department at [facility] yesterday afternoon 07/12/2014 by her family after they noticed an altered level of consciousness associated with a right facial droop and mild dysarthria. The patient was last seen normal the day before, 07/11/2014, time unknown. A CT scan of the head was negative for acute findings. An MRI revealed an acute right frontal subcortical white matter infarct. She was on aspirin 325 mg daily prior to admission but is currently not on antiplatelet therapy. She was admitted for further evaluation. She was not a tPA candidate secondary to delay in arrival. A neurology consult was requested.    SUBJECTIVE (INTERVAL HISTORY) Patient lying in the bed. R eye patch on. Patient reports they put in on her when she arrived. Pt stated that she had right facial droop and right eye not able to close for a week, and she came in for right hand not able to hold the fork during eating. I tried to contract her son at [phone] but nobody available over the phone. As per Dr. Grandville Silos and chart, she was admitted due to right facial droop, not able to close eyes, and slurry speech, acute onset on Friday.  OBJECTIVE  Recent Labs Lab 07/12/14 1402  GLUCAP 125*    Recent Labs Lab 07/12/14 1407 07/12/14 1420 07/13/14 1132  NA 139 141 141  K 3.0* 2.9* 3.7  CL 98 96 101  CO2 28  --  30  GLUCOSE 134* 124* 101*  BUN 9 7 8   CREATININE 0.81 0.90 0.81  CALCIUM 9.2  --  9.0  MG  --   --  2.1    Recent Labs Lab 07/12/14 1407 07/13/14 1132  AST 15 15  ALT 11 12  ALKPHOS 76 72  BILITOT 0.9 0.9  PROT 6.5 6.3  ALBUMIN 3.5 3.4*    Recent Labs Lab 07/12/14 1407 07/12/14 1420 07/13/14 1132  WBC 6.8  --  6.4  NEUTROABS 4.0  --    --   HGB 15.4* 16.0* 15.0  HCT 45.0 47.0* 43.8  MCV 91.3  --  90.9  PLT 227  --  219   No results found for this basename: CKTOTAL, CKMB, CKMBINDEX, TROPONINI,  in the last 168 hours  Recent Labs  07/12/14 1408  LABPROT 13.9  INR 1.07    Recent Labs  07/13/14 [place] 1.006  PHURINE 6.5  GLUCOSEU NEGATIVE  HGBUR NEGATIVE  BILIRUBINUR NEGATIVE  KETONESUR NEGATIVE  PROTEINUR NEGATIVE  UROBILINOGEN 1.0  NITRITE NEGATIVE  LEUKOCYTESUR MODERATE*       Component Value Date/Time   CHOL 119 07/13/2014 0620   TRIG 106 07/13/2014 0620   HDL 37* 07/13/2014 0620   CHOLHDL 3.2 07/13/2014 0620   VLDL 21 07/13/2014 0620   LDLCALC 61 07/13/2014 0620   Lab Results  Component Value Date   HGBA1C 6.2* 07/13/2014      Component Value Date/Time   LABOPIA NONE DETECTED 07/13/2014 1946   COCAINSCRNUR NONE DETECTED 07/13/2014 1946   LABBENZ NONE DETECTED 07/13/2014 1946   AMPHETMU NONE DETECTED 07/13/2014 1946   THCU NONE DETECTED 07/13/2014 1946   LABBARB NONE DETECTED 07/13/2014 1946     Recent Labs Lab 07/12/14 1408  ETH <11   Dg Chest 2  View 07/13/2014    No active cardiopulmonary disease.     Mr Brain Wo Contrast 07/13/2014    Acute RIGHT frontal subcortical white matter punctate infarct. It is unclear if this relates to the patient's RIGHT facial weakness.  Advanced atrophy and small vessel disease.    Mr Jodene Nam Head/brain Wo Cm 07/13/2014    No proximal stenosis of the carotid, or basilar arteries. Mild to moderate intracranial atherosclerotic change as described.     2D Echocardiogram  EF 65-70%.   Carotid Doppler  No evidence of hemodynamically significant internal carotid artery stenosis. Vertebral artery flow is antegrade.    PHYSICAL EXAM GENERAL: In no acute distress. HEENT: Supple. Atraumatic normocephalic.  ABDOMEN: soft EXTREMITIES: No edema  BACK: Normal. SKIN: Normal by inspection. MENTAL STATUS: Alert and lucid but at times resistant  to the evaluation and perseveration. There appears to be some cognitive impairment at baseline. She complains of significant pain involving the left upper extremity.  CRANIAL NERVES: Pupils are equal, round and reactive to light; extra ocular movements are full, there is no significant nystagmus; visual fields are full; Significant weakness of the right upper and lower facial muscles, tongue is midline; uvula is midline; shoulder elevation is normal. MOTOR: Some difficulty with examination due to lack of cooperation at times. Legs are 5/5. Right upper extremity at least 4+. Left upper extremity 3/5 but she does have significant pain. COORDINATION: Left finger to nose is normal, no rest tremor; no intention tremor; no postural tremor; no bradykinesia. SENSATION: Normal to light pain.  ASSESSMENT/PLAN Kimberly Craig is a 80 y.o. female with hx of Alzheimer's dementia, hypertension, hyperlipidemia, positive family history stroke, and previous TIA  presenting with right facial droop and mild dysarthria ( This finding does not cooperate with the acute MRI findings--- however, there is a chronic large lacunar infarcts involving the left pontine infarct which could explain the right facial droop). Exam now shows a new left upper extremity monoparesis not present on initial presentation. MRI - Acute RIGHT frontal subcortical white matter infarct. She did not receive IV t-PA  due to late presentation and mild deficits.  Stroke work up completed.  Right Bell's Palsy  Add eye drops and Lacrilube at hs  Eye patch R eye  Prednisone 60mg  daily for 7 days  Valtrex 1000mg  tid for 7 days  Stroke - incidental Acute RIGHT frontal subcortical white matter infarct, thrombotic secondary to small vessel disease with vasculopathy  aspirin 325 mg orally every day prior to admission, now on clopidogrel 75 mg orally every day  MRI -  Acute R frontal subcortical white matter, chronic bilateral basilar ganglia  lacunes, chronic large left pontine infarct  MRA - no significant stenosis, small vessel disease   2D Echo - no source of embolus  Carotid - no significant stenosis  LDL 61, no statin necessary as meeting goal of LDL <100  HgbA1c 6.2   Subcutaneous heparin for VTE prophylaxis  Cardiac thin liquids.   Up with assistance  Resultant - resolution of deficits  Therapy needs: - SNF for PT & OT  Risk factor management/education  Disposition:  SNF (from [facility], prefers [facility] across the street. SW following)  Hypertension   Home meds:  Lisinopril. Resumed in hospital  BP  (145-181)/(68-93) 181/79 mmHg (08/30 5573) past 24h  SBP goal < 180  Stable  Hyperlipidemia  Home meds:  Lipitor 10 mg daily. Resumed in hospital.  LDL 61  At LDL goal < 100  Other Stroke Risk Factors Advanced age Family history of stroke in her father Hx of TIA  Other Active Problems  Alzheimer's dementia  Hospital day # 3  No further stroke workup indicated.  Patient has a 10-15% risk of having another stroke over the next year, the highest risk is within 2 weeks of the most recent stroke/TIA (risk of having a stroke following a stroke or TIA is the same).  Ongoing risk factor control by Primary Care Physician  Stroke Service will sign off. Please call should any needs arise.  Follow up with Dr. Erlinda Hong, [facility], in 2 months.  Burnetta Sabin, MSN, RN, ANVP-BC, ANP-BC, [facility] Pager: [phone] 07/15/2014 4:52 PM    To contact Stroke Continuity provider, please refer to [email or web address] After hours, contact General Neurology

## 2014-07-15 NOTE — Clinical Social Work Psychosocial (Signed)
Clinical Social Work Department BRIEF PSYCHOSOCIAL ASSESSMENT 07/15/2014  Patient:  Kimberly Craig,Kimberly Craig     Account Number:  [id number]     Admit date:  07/12/2014  Clinical Social Worker:  Claborn Janusz, LCSWA  Date/Time:  07/15/2014 12:07 PM  Referred by:  Physician  Date Referred:  07/15/2014 Referred for  SNF Placement   Other Referral:   Interview type:  Other - See comment Other interview type:   Met with patient and her son- Kimberly Craig.    PSYCHOSOCIAL DATA Living Status:  FACILITY Admitted from facility:  [facility] Level of care:  Independent Living Primary support name:  son- Kimberly Craig Primary support relationship to patient:  FAMILY Degree of support available:   good    CURRENT CONCERNS Current Concerns  Post-Acute Placement   Other Concerns:    SOCIAL WORK ASSESSMENT / PLAN Patient has been residing at [facility] in their Independent Living area for about 1 year- the patient and son understand the need for SNF level of care at discharge. Discussed area SNF's and son is interested in [facility] as this is across the street from his work as well as near [facility]. CSW explained coverage, SNF search and will proceed with this plan-  Patient reports feeling better about things since working with therapies- she is motivated for rehab and hopeful to get back to her apartment soon.   Assessment/plan status:  Other - See comment Other assessment/ plan:   FL2 and PASARR for SNF search   Information/referral to community resources:   SNF list    PATIENT'S/FAMILY'S RESPONSE TO PLAN OF CARE: Patient and her son appreciative of CSW visit and assistance- they are optimisitic for rehab and for her return to [facility] soon-       Teffany Blaszczyk, MSW, LCSWA [phone] 

## 2014-07-15 NOTE — Progress Notes (Signed)
Physical Therapy Treatment Patient Details Name: Kimberly Craig MRN: [id number] DOB: 1929-12-13 Today's Date: 07/15/2014    History of Present Illness 80 yo with hx of Alzheimer's dementia with acute R frontal subcortical white matter infarct. R facial droop.    PT Comments    Pt continues to be unsafe from mobility standpoint to D/C without 24/7 (A). Pt saturated with urine upon PT arrival; (A) with perineal hygiene, RN made aware. Pt requires max redirection during session but is willing to participate. Will cont to follow per POC.   Follow Up Recommendations  SNF;Supervision/Assistance - 24 hour     Equipment Recommendations  None recommended by PT    Recommendations for Other Services       Precautions / Restrictions Precautions Precautions: Fall Restrictions Weight Bearing Restrictions: No    Mobility  Bed Mobility Overal bed mobility: Needs Assistance Bed Mobility: Supine to Sit     Supine to sit: Min assist;HOB elevated     General bed mobility comments: max cues for Lt UE management; pt neglectful of Lt UE; min (A) to elevate trunk; max cues for redirection  Transfers Overall transfer level: Needs assistance Equipment used: Rolling walker (2 wheeled) Transfers: Sit to/from Stand Sit to Stand: Mod assist         General transfer comment: (A) to power up to stand; continues to have heavy lean posteriorly; cues for safety   Ambulation/Gait Ambulation/Gait assistance: Min assist Ambulation Distance (Feet): 80 Feet Assistive device: Rolling walker (2 wheeled) Gait Pattern/deviations: Step-through pattern;Decreased weight shift to left;Decreased dorsiflexion - left;Narrow base of support;Trunk flexed Gait velocity: slower today Gait velocity interpretation: Below normal speed for age/gender General Gait Details: cues for safety and management of RW; pt very distracted in hallway and required max redirection to stay on task   Stairs             Wheelchair Mobility    Modified Rankin (Stroke Patients Only) Modified Rankin (Stroke Patients Only) Pre-Morbid Rankin Score: Moderate disability Modified Rankin: Moderately severe disability     Balance Overall balance assessment: Needs assistance;History of Falls Sitting-balance support: Feet supported;No upper extremity supported Sitting balance-Leahy Scale: Fair   Postural control: Posterior lean Standing balance support: During functional activity;Bilateral upper extremity supported Standing balance-Leahy Scale: Poor Standing balance comment: requiers min (A) and RW to maintain balance                     Cognition Arousal/Alertness: Awake/alert Behavior During Therapy: WFL for tasks assessed/performed Overall Cognitive Status: History of cognitive impairments - at baseline                      Exercises      General Comments        Pertinent Vitals/Pain Pain Assessment: No/denies pain    Home Living                      Prior Function            PT Goals (current goals can now be found in the care plan section) Acute Rehab PT Goals Patient Stated Goal: to eat my fruit and walk PT Goal Formulation: With patient Time For Goal Achievement: 07/20/14 Potential to Achieve Goals: Fair Progress towards PT goals: Progressing toward goals    Frequency  Min 3X/week    PT Plan Current plan remains appropriate    Co-evaluation  End of Session Equipment Utilized During Treatment: Gait belt Activity Tolerance: Patient tolerated treatment well Patient left: in chair;with call bell/phone within reach;with chair alarm set     Time: 0623-7628 PT Time Calculation (min): 27 min  Charges:  $Gait Training: 23-37 mins                    G CodesGustavus Bryant, [place] 07/15/2014, 8:51 AM

## 2014-07-15 NOTE — Clinical Social Work Placement (Signed)
Clinical Social Work Department CLINICAL SOCIAL WORK PLACEMENT NOTE 07/15/2014  Patient:  Kimberly Craig, Kimberly Craig  Account Number:  [id number] Admit date:  07/12/2014  Clinical Social Worker:  Daiva Huge  Date/time:  07/15/2014 12:15 PM  Clinical Social Work is seeking post-discharge placement for this patient at the following level of care:   [facility]   (*CSW will update this form in Epic as items are completed)   07/15/2014  Patient/family provided with [facility] Department of Clinical Social Work's list of facilities offering this level of care within the geographic area requested by the patient (or if unable, by the patient's family).  07/15/2014  Patient/family informed of their freedom to choose among providers that offer the needed level of care, that participate in Medicare, Medicaid or managed care program needed by the patient, have an available bed and are willing to accept the patient.  07/15/2014  Patient/family informed of [facility]' ownership interest in [facility], as well as of the fact that they are under no obligation to receive care at this facility.  PASARR submitted to EDS on 07/15/2014 PASARR number received on 07/15/2014  FL2 transmitted to all facilities in geographic area requested by pt/family on  07/15/2014 FL2 transmitted to all facilities within larger geographic area on   Patient informed that his/her managed care company has contracts with or will negotiate with  certain facilities, including the following:     Patient/family informed of bed offers received:   Patient chooses bed at  Physician recommends and patient chooses bed at    Patient to be transferred to  on   Patient to be transferred to facility by  Patient and family notified of transfer on  Name of family member notified:    The following physician request were entered in Epic:   Additional Comments: Eduard Clos, MSW, [place]

## 2014-07-15 NOTE — Care Management Note (Addendum)
  Page 1 of 1   07/15/2014     3:36:58 PM CARE MANAGEMENT NOTE 07/15/2014  Patient:  Kimberly Craig, Kimberly Craig   Account Number:  [id number]  Date Initiated:  07/15/2014  Documentation initiated by:  Lorne Skeens  Subjective/Objective Assessment:   Patient was admitted with CVA. Lives at [place].     Action/Plan:   Will follow for discharge needs pending PT/OT evals and physician orders.   Anticipated DC Date:     Anticipated DC Plan:  SKILLED NURSING FACILITY  In-house referral  Clinical Social Worker         Choice offered to / List presented to:             Status of service:  In process, will continue to follow Medicare Important Message given?  YES (If response is "NO", the following Medicare IM given date fields will be blank) Date Medicare IM given:  07/15/2014 Medicare IM given by:  Lorne Skeens Date Additional Medicare IM given:   Additional Medicare IM given by:    Discharge Disposition:    Per UR Regulation:    If discussed at Long Length of Stay Meetings, dates discussed:    Comments:  07/15/14 [place], MSN, CM- Medicare IM letter provided.

## 2014-07-15 NOTE — Progress Notes (Signed)
[facility] PROGRESS NOTE  Kimberly Craig RJJ:[id number] DOB: 11-Feb-1930 DOA: 07/12/2014 PCP: Garnet Koyanagi, DO  Assessment/Plan: #1 acute right frontal subcortical white matter infarct CT head negative. Carotid Doppler with preliminary findings negative for significant ICA stenosis. 2-D echo pending.PT/OT. LDL is 61. Continue Lipitor. Continue blood pressure control. Per neurology with findings on MRI are not the cause of patient's symptoms and patient's symptoms likely secondary to another infarct not seen on MRI raising concern for embolic courses. Continue place for secondary stroke prevention. I patch to the right eye at night. Continue Plavix. Neurology following and appreciate input and recommendations.   #2 Bells palsy Continue eye drops and eye patch at bedtime. Will place on oral steriods.Neuro ff.   #3 hypertension Stable. Increase lisinopril to 20mg  daily.  #4 hypokalemia Repleted.  #5 dementia Continue Aricept.  #6 urinary tract infection Urine cultures with Ecoli. Sensitivities pending. Continue IV Rocephin.  #7 prophylaxis Heparin for DVT prophylaxis.  Code Status: full Family Communication: updated patient and son at bedside. Disposition Plan: back to skilled nursing facility when medically stable   Consultants:  Neurology Dr. Doy Mince 07/13/2014  Procedures:  MRI MRA and 07/13/2014  CT head 07/12/2014  Chest x-ray 8/29/ 2015  Carotid Dopplers 07/13/2014  2 d echo 07/14/14  Carotid doppler 07/13/14  Antibiotics:  none  HPI/Subjective: Patient c/o back pain.  Objective: Filed Vitals:   07/15/14 1330  BP: 184/58  Pulse: 64  Temp: 98.3 F (36.8 C)  Resp: 18   No intake or output data in the 24 hours ending 07/15/14 1733 Filed Weights   07/12/14 2006  Weight: 74.3 kg (163 lb 12.8 oz)    Exam:   General:  NAD. Right facial weakness.  Cardiovascular: RRR  Respiratory: CTAB  Abdomen: soft, nontender, nondistended, positive  bowel sounds  Musculoskeletal: no clubbing cyanosis or edema. Patient with 5/5 bilateral upper extremity strength. 5/5 bilateral lower extremity strength.  Neurology: patient with right facial weakness. Inability to fully close the right eye. Right facial weakness.  Data Reviewed: Basic Metabolic Panel:  Recent Labs Lab 07/12/14 1407 07/12/14 1420 07/13/14 1132  NA 139 141 141  K 3.0* 2.9* 3.7  CL 98 96 101  CO2 28  --  30  GLUCOSE 134* 124* 101*  BUN 9 7 8   CREATININE 0.81 0.90 0.81  CALCIUM 9.2  --  9.0  MG  --   --  2.1   Liver Function Tests:  Recent Labs Lab 07/12/14 1407 07/13/14 1132  AST 15 15  ALT 11 12  ALKPHOS 76 72  BILITOT 0.9 0.9  PROT 6.5 6.3  ALBUMIN 3.5 3.4*   No results found for this basename: LIPASE, AMYLASE,  in the last 168 hours No results found for this basename: AMMONIA,  in the last 168 hours CBC:  Recent Labs Lab 07/12/14 1407 07/12/14 1420 07/13/14 1132  WBC 6.8  --  6.4  NEUTROABS 4.0  --   --   HGB 15.4* 16.0* 15.0  HCT 45.0 47.0* 43.8  MCV 91.3  --  90.9  PLT 227  --  219   Cardiac Enzymes: No results found for this basename: CKTOTAL, CKMB, CKMBINDEX, TROPONINI,  in the last 168 hours BNP (last 3 results) No results found for this basename: PROBNP,  in the last 8760 hours CBG:  Recent Labs Lab 07/12/14 1402  GLUCAP 125*    Recent Results (from the past 240 hour(s))  URINE CULTURE     Status: None  Collection Time    07/13/14  7:46 PM      Result Value Ref Range Status   Specimen Description URINE, RANDOM   Final   Special Requests NONE   Final   Culture  Setup Time     Final   Value: 07/14/2014 15:03     Performed at [place]     Final   Value: >=100,000 COLONIES/ML     Performed at [facility]   Culture     Final   Value: ESCHERICHIA COLI     Performed at [facility]   Report Status PENDING   Incomplete     Studies: No results found.  Scheduled Meds: .  artificial tears   Right Eye QHS  . atorvastatin  10 mg Oral Daily  . calcium gluconate  500 mg Oral Daily  . cefTRIAXone (ROCEPHIN)  IV  1 g Intravenous Q24H  . clopidogrel  75 mg Oral Daily  . donepezil  23 mg Oral QHS  . famotidine  20 mg Oral BID  . heparin  5,000 Units Subcutaneous 3 times per day  . lisinopril  10 mg Oral Daily  . polyvinyl alcohol  1 drop Right Eye TID  . predniSONE  60 mg Oral QAC breakfast  . simethicone  80 mg Oral QID  . valACYclovir  1,000 mg Oral TID   Continuous Infusions:   Principal Problem:   CVA (cerebral infarction) Active Problems:   HTN (hypertension)   Hypokalemia   Dementia   UTI (urinary tract infection)   R Bell's palsy    Time spent: [place] MD [facility] Pager [phone]. If 7PM-7AM, please contact night-coverage at [email or web address], password [facility] 07/15/2014, 5:33 PM  LOS: 3 days

## 2014-07-15 NOTE — Progress Notes (Signed)
Pt refuses all sticks including labs and SQ heparin. Dr. Grandville Silos notified. Will continue to monitor Pt. Holli Humbles, RN

## 2014-07-16 DIAGNOSIS — I69992 Facial weakness following unspecified cerebrovascular disease: Secondary | ICD-10-CM | POA: Diagnosis not present

## 2014-07-16 DIAGNOSIS — I69922 Dysarthria following unspecified cerebrovascular disease: Secondary | ICD-10-CM | POA: Diagnosis not present

## 2014-07-16 DIAGNOSIS — N390 Urinary tract infection, site not specified: Secondary | ICD-10-CM | POA: Diagnosis not present

## 2014-07-16 DIAGNOSIS — G510 Bell's palsy: Secondary | ICD-10-CM | POA: Diagnosis not present

## 2014-07-16 DIAGNOSIS — I6320 Cerebral infarction due to unspecified occlusion or stenosis of unspecified precerebral arteries: Secondary | ICD-10-CM | POA: Diagnosis not present

## 2014-07-16 DIAGNOSIS — F0390 Unspecified dementia without behavioral disturbance: Secondary | ICD-10-CM | POA: Diagnosis not present

## 2014-07-16 DIAGNOSIS — I69928 Other speech and language deficits following unspecified cerebrovascular disease: Secondary | ICD-10-CM | POA: Diagnosis not present

## 2014-07-16 DIAGNOSIS — M6281 Muscle weakness (generalized): Secondary | ICD-10-CM | POA: Diagnosis not present

## 2014-07-16 DIAGNOSIS — A498 Other bacterial infections of unspecified site: Secondary | ICD-10-CM

## 2014-07-16 DIAGNOSIS — I6350 Cerebral infarction due to unspecified occlusion or stenosis of unspecified cerebral artery: Secondary | ICD-10-CM | POA: Diagnosis not present

## 2014-07-16 DIAGNOSIS — I10 Essential (primary) hypertension: Secondary | ICD-10-CM | POA: Diagnosis not present

## 2014-07-16 DIAGNOSIS — F0280 Dementia in other diseases classified elsewhere without behavioral disturbance: Secondary | ICD-10-CM | POA: Diagnosis not present

## 2014-07-16 DIAGNOSIS — E785 Hyperlipidemia, unspecified: Secondary | ICD-10-CM | POA: Diagnosis not present

## 2014-07-16 DIAGNOSIS — I69991 Dysphagia following unspecified cerebrovascular disease: Secondary | ICD-10-CM | POA: Diagnosis not present

## 2014-07-16 DIAGNOSIS — R269 Unspecified abnormalities of gait and mobility: Secondary | ICD-10-CM | POA: Diagnosis not present

## 2014-07-16 DIAGNOSIS — G4700 Insomnia, unspecified: Secondary | ICD-10-CM | POA: Diagnosis not present

## 2014-07-16 DIAGNOSIS — G309 Alzheimer's disease, unspecified: Secondary | ICD-10-CM | POA: Diagnosis not present

## 2014-07-16 MED ORDER — VALACYCLOVIR HCL 1 G PO TABS: 1000.0000 mg | ORAL_TABLET | Freq: Three times a day (TID) | ORAL | Status: DC

## 2014-07-16 MED ORDER — LISINOPRIL 20 MG PO TABS: 20.0000 mg | ORAL_TABLET | Freq: Every day | ORAL | Status: DC

## 2014-07-16 MED ORDER — PREDNISONE 20 MG PO TABS: 60.0000 mg | ORAL_TABLET | Freq: Every day | ORAL | Status: AC

## 2014-07-16 MED ORDER — POLYVINYL ALCOHOL 1.4 % OP SOLN: 1.0000 [drp] | Freq: Three times a day (TID) | OPHTHALMIC | Status: DC

## 2014-07-16 MED ORDER — CEFUROXIME AXETIL 500 MG PO TABS: 500.0000 mg | ORAL_TABLET | Freq: Two times a day (BID) | ORAL | Status: DC

## 2014-07-16 MED ORDER — CLOPIDOGREL BISULFATE 75 MG PO TABS: 75.0000 mg | ORAL_TABLET | Freq: Every day | ORAL | Status: DC

## 2014-07-16 MED ORDER — TRAMADOL-ACETAMINOPHEN 37.5-325 MG PO TABS: 1.0000 | ORAL_TABLET | Freq: Four times a day (QID) | ORAL | Status: DC | PRN

## 2014-07-16 MED ORDER — ARTIFICIAL TEARS OP OINT: TOPICAL_OINTMENT | Freq: Every day | OPHTHALMIC | Status: DC

## 2014-07-16 NOTE — Progress Notes (Signed)
Patient d/c to [facility] this afternoon, picked up by her son.D/c instructions given and report given to Kathlee Nations at the facility. Assessments remained unchanged prior to discharge.

## 2014-07-16 NOTE — Discharge Summary (Signed)
Physician Discharge Summary  Kimberly Craig NWG:[id number] DOB: 10-Oct-1930 DOA: 07/12/2014  PCP: Garnet Koyanagi, DO  Admit date: 07/12/2014 Discharge date: 07/16/2014  Time spent: 70 minutes  Recommendations for Outpatient Follow-up:  1. Followup with Dr. Erlinda Hong of neurology in 2 months. 2. Followup with M.D. at the skilled nursing facility.  Discharge Diagnoses:  Principal Problem:   CVA (cerebral infarction) Active Problems:   R Bell's palsy   HTN (hypertension)   Hypokalemia   Dementia   E-coli UTI   Discharge Condition: Stable and improved  Diet recommendation: Heart healthy  Filed Weights   07/12/14 2006  Weight: 74.3 kg (163 lb 12.8 oz)    History of present illness:  Kimberly Craig is a 80 y.o. female with a PMH of alzheimer's dementia, HTN, HLP who lives in independent living is brought in her by her son on the day of admission, for a left facial drop and trouble with speaking. He was not present when the symptoms started but it is believed that they began somewhere around 10 AM. The symptoms have not progressed. The patient does not c/o any visual disturbance, numbness or other focal weakness.      Hospital Course:  #1 acute right frontal subcortical white matter infarct  Patient was admitted with right facial droop and slurred speech with concerns for acute CVA. Patient was admitted to telemetry floor neurology was consulted a stroke workup undertaken. CT head negative. Carotid Doppler negative for significant ICA stenosis. MRI of the head which was done showed an incidental acute right frontal subcortical white matter infarction felt to likely be thrombotic in nature. Patient had been on aspirin prior to admission and this was changed to Plavix.  2-D echo which was done had no source of emboli. Patient was seen by PT OT.  LDL is 61. Patient was maintained on home regimen of Lipitor. Patient was maintained on Plavix and will followup with neurology as outpatient. #2 Bells  palsy  Patient was noted on admission to have a right facial droop, inability to close the right eye and MRI findings were not consistent with clinical findings. Will follow patient likely had a Bell's palsy. Patient was seen by neurology. Patient was placed on a high patch at bedtime, eyedrops and eye ointment, and started on a seven-day course of oral prednisone. Patient was also placed on Valtrex for the full course of 7 days. Outpatient followup.  #3 hypertension  Stable. Patient was maintained on lisinopril and dose adjusted to 20 mg daily during the hospitalization. I #4 hypokalemia  Repleted.  #5 dementia  Continued on home regimen Aricept.  #6 E COLI  UTI Urinalysis consistent with urinary tract infection. Patient was done on IV Rocephin. Urine cultures grew out Escherichia coli. Patient be discharged on 5 more days of oral Ceftin to complete a 7 day course of antibiotic therapy.      Procedures: MRI MRA and 07/13/2014  CT head 07/12/2014  Chest x-ray 8/29/ 2015  Carotid Dopplers 07/13/2014  2 d echo 07/14/14  Carotid doppler 07/13/14   Consultations: Neurology Dr. Doy Mince 07/13/2014   Discharge Exam: Filed Vitals:   07/16/14 1009  BP: 166/74  Pulse: 68  Temp: 98.1 F (36.7 C)  Resp: 18    General: NAD Cardiovascular: RRR Respiratory: CTAB Neuro: Right facial weakness, inability to close the right eye.  Discharge Instructions You were cared for by a hospitalist during your hospital stay. If you have any questions about your discharge medications or the  care you received while you were in the hospital after you are discharged, you can call the unit and asked to speak with the hospitalist on call if the hospitalist that took care of you is not available. Once you are discharged, your primary care physician will handle any further medical issues. Please note that NO REFILLS for any discharge medications will be authorized once you are discharged, as it is imperative  that you return to your primary care physician (or establish a relationship with a primary care physician if you do not have one) for your aftercare needs so that they can reassess your need for medications and monitor your lab values.      Discharge Instructions   Diet - low sodium heart healthy    Complete by:  As directed      Discharge instructions    Complete by:  As directed   Patch to right eye at bedtime. Follow up with Dr Erlinda Hong, neurology in 2 months. Follow up with MD at SNF.     Increase activity slowly    Complete by:  As directed             Medication List    STOP taking these medications       aspirin 325 MG tablet      TAKE these medications       ARICEPT 23 MG Tabs tablet  Generic drug:  donepezil  Take 23 mg by mouth at bedtime.     artificial tears Oint ophthalmic ointment  Place into the right eye at bedtime.     atorvastatin 10 MG tablet  Commonly known as:  LIPITOR  Take 10 mg by mouth daily.     calcium gluconate 500 MG tablet  Take 500 mg by mouth daily.     cefUROXime 500 MG tablet  Commonly known as:  CEFTIN  Take 1 tablet (500 mg total) by mouth 2 (two) times daily with a meal. Take for 5 days then stop.     clopidogrel 75 MG tablet  Commonly known as:  PLAVIX  Take 1 tablet (75 mg total) by mouth daily.     lisinopril 20 MG tablet  Commonly known as:  PRINIVIL,ZESTRIL  Take 1 tablet (20 mg total) by mouth daily.     polyvinyl alcohol 1.4 % ophthalmic solution  Commonly known as:  LIQUIFILM TEARS  Place 1 drop into the right eye 3 (three) times daily.     predniSONE 20 MG tablet  Commonly known as:  DELTASONE  Take 3 tablets (60 mg total) by mouth daily before breakfast. Take for 5 days then stop.     traMADol-acetaminophen 37.5-325 MG per tablet  Commonly known as:  ULTRACET  Take 1 tablet by mouth every 6 (six) hours as needed for moderate pain.     valACYclovir 1000 MG tablet  Commonly known as:  VALTREX  Take 1 tablet  (1,000 mg total) by mouth 3 (three) times daily. Take for 6 days then stop.       No Known Allergies Follow-up Information   Follow up with Xu,Jindong, MD. Schedule an appointment as soon as possible for a visit in 2 months. (Stroke Clinic)    Specialty:  Neurology   Contact information:   [address] [place] [address] [phone]       Please follow up. (f/u with MD at [facility])        The results of significant diagnostics from this hospitalization (including  imaging, microbiology, ancillary and laboratory) are listed below for reference.    Significant Diagnostic Studies: Dg Chest 2 View  07/13/2014   CLINICAL DATA:  Stroke symptoms  EXAM: CHEST  2 VIEW  COMPARISON:  None.  FINDINGS: The heart size and mediastinal contours are within normal limits. Both lungs are clear. The visualized skeletal structures are unremarkable.  IMPRESSION: No active cardiopulmonary disease.   Electronically Signed   By: Inez Catalina M.D.   On: 07/13/2014 09:46   Ct Head (brain) Wo Contrast  07/12/2014   CLINICAL DATA:  Altered mental status.  EXAM: CT HEAD WITHOUT CONTRAST  TECHNIQUE: Contiguous axial images were obtained from the base of the skull through the vertex without intravenous contrast.  COMPARISON:  MRI, 04/04/2012.  FINDINGS: Ventricles are normal in configuration. There is ventricular and sulcal enlargement reflecting moderate atrophy. There are no parenchymal masses or mass effect. Extensive white matter hypoattenuation is consistent with advanced chronic microvascular ischemic change. There are small lacune infarcts in the deep white matter. An old lacunar infarct is noted in the left central pons. There is no evidence of a recent infarct.  There are no extra-axial masses or abnormal fluid collections.  No intracranial hemorrhage.  Visualized sinuses and mastoid air cells are clear. No skull lesion.  IMPRESSION: 1. No acute intracranial abnormalities. 2. Moderate atrophy,  advanced chronic microvascular ischemic change, an old small deep white matter lacune infarcts and old left central pontine infarct.   Electronically Signed   By: Lajean Manes M.D.   On: 07/12/2014 15:03   Mr Brain Wo Contrast  07/13/2014   CLINICAL DATA:  RIGHT facial droop.  Difficulty speaking.  EXAM: MRI HEAD WITHOUT CONTRAST  MRA HEAD WITHOUT CONTRAST  TECHNIQUE: Multiplanar, multiecho pulse sequences of the brain and surrounding structures were obtained without intravenous contrast. Angiographic images of the head were obtained using MRA technique without contrast.  COMPARISON:  CT head 07/12/2014  FINDINGS: MRI HEAD FINDINGS  There is a subcentimeter area of acute infarction affecting the RIGHT frontal subcortical white matter (image 22 series 13). No other areas of acute infarction.  No hemorrhage, mass lesion, or extra-axial fluid hydrocephalus ex vacuo. Generalized cerebral and cerebellar atrophy. Moderate to severe T2 and FLAIR hyperintensities in the periventricular and subcortical white matter, also affecting the brainstem, consistent with chronic microvascular ischemic change. Widespread areas of lacunar infarction affect the basal ganglia and brainstem. Prominent perivascular spaces suggesting chronic hypertension.  No significant sinus or mastoid disease. Negative orbits. Sebaceous cyst over the LEFT cheek of no clinical consequence.  Compared with prior CT, this infarct is not visible.  MRA HEAD FINDINGS  The internal carotid arteries are widely patent. The basilar artery is widely patent with the RIGHT vertebral dominant. LEFT vertebral contributes to the basilar, but is diminutive following the origin of the left PICA. Mild non stenotic irregularity of the proximal anterior cerebral arteries. No proximal or distal MCA stenosis or occlusion. Fetal origin RIGHT PCA. 75% stenosis at the P1/P2 junction LEFT PCA. No cerebellar branch occlusion or intracranial aneurysm.  IMPRESSION: Acute RIGHT  frontal subcortical white matter infarct. It is unclear if this relates to the patient's RIGHT facial weakness.  Advanced atrophy and small vessel disease.  No proximal stenosis of the carotid, or basilar arteries. Mild to moderate intracranial atherosclerotic change as described.   Electronically Signed   By: Rolla Flatten M.D.   On: 07/13/2014 10:49   Mr Jodene Nam Head/brain Wo Cm  07/13/2014   CLINICAL  DATA:  RIGHT facial droop.  Difficulty speaking.  EXAM: MRI HEAD WITHOUT CONTRAST  MRA HEAD WITHOUT CONTRAST  TECHNIQUE: Multiplanar, multiecho pulse sequences of the brain and surrounding structures were obtained without intravenous contrast. Angiographic images of the head were obtained using MRA technique without contrast.  COMPARISON:  CT head 07/12/2014  FINDINGS: MRI HEAD FINDINGS  There is a subcentimeter area of acute infarction affecting the RIGHT frontal subcortical white matter (image 22 series 13). No other areas of acute infarction.  No hemorrhage, mass lesion, or extra-axial fluid hydrocephalus ex vacuo. Generalized cerebral and cerebellar atrophy. Moderate to severe T2 and FLAIR hyperintensities in the periventricular and subcortical white matter, also affecting the brainstem, consistent with chronic microvascular ischemic change. Widespread areas of lacunar infarction affect the basal ganglia and brainstem. Prominent perivascular spaces suggesting chronic hypertension.  No significant sinus or mastoid disease. Negative orbits. Sebaceous cyst over the LEFT cheek of no clinical consequence.  Compared with prior CT, this infarct is not visible.  MRA HEAD FINDINGS  The internal carotid arteries are widely patent. The basilar artery is widely patent with the RIGHT vertebral dominant. LEFT vertebral contributes to the basilar, but is diminutive following the origin of the left PICA. Mild non stenotic irregularity of the proximal anterior cerebral arteries. No proximal or distal MCA stenosis or occlusion.  Fetal origin RIGHT PCA. 75% stenosis at the P1/P2 junction LEFT PCA. No cerebellar branch occlusion or intracranial aneurysm.  IMPRESSION: Acute RIGHT frontal subcortical white matter infarct. It is unclear if this relates to the patient's RIGHT facial weakness.  Advanced atrophy and small vessel disease.  No proximal stenosis of the carotid, or basilar arteries. Mild to moderate intracranial atherosclerotic change as described.   Electronically Signed   By: Rolla Flatten M.D.   On: 07/13/2014 10:49    Microbiology: Recent Results (from the past 240 hour(s))  URINE CULTURE     Status: None   Collection Time    07/13/14  7:46 PM      Result Value Ref Range Status   Specimen Description URINE, RANDOM   Final   Special Requests NONE   Final   Culture  Setup Time     Final   Value: 07/14/2014 15:03     Performed at [facility]     Final   Value: >=100,000 COLONIES/ML     Performed at [facility]   Culture     Final   Value: ESCHERICHIA COLI     Performed at [facility]   Report Status PENDING   Incomplete   Organism ID, Bacteria ESCHERICHIA COLI   Final     Labs: Basic Metabolic Panel:  Recent Labs Lab 07/12/14 1407 07/12/14 1420 07/13/14 1132  NA 139 141 141  K 3.0* 2.9* 3.7  CL 98 96 101  CO2 28  --  30  GLUCOSE 134* 124* 101*  BUN 9 7 8   CREATININE 0.81 0.90 0.81  CALCIUM 9.2  --  9.0  MG  --   --  2.1   Liver Function Tests:  Recent Labs Lab 07/12/14 1407 07/13/14 1132  AST 15 15  ALT 11 12  ALKPHOS 76 72  BILITOT 0.9 0.9  PROT 6.5 6.3  ALBUMIN 3.5 3.4*   No results found for this basename: LIPASE, AMYLASE,  in the last 168 hours No results found for this basename: AMMONIA,  in the last 168 hours CBC:  Recent Labs Lab 07/12/14 1407  07/12/14 1420 07/13/14 1132  WBC 6.8  --  6.4  NEUTROABS 4.0  --   --   HGB 15.4* 16.0* 15.0  HCT 45.0 47.0* 43.8  MCV 91.3  --  90.9  PLT 227  --  219   Cardiac Enzymes: No  results found for this basename: CKTOTAL, CKMB, CKMBINDEX, TROPONINI,  in the last 168 hours BNP: BNP (last 3 results) No results found for this basename: PROBNP,  in the last 8760 hours CBG:  Recent Labs Lab 07/12/14 1402  GLUCAP 125*       Signed:  Scarlette Hogston MD [facility] 07/16/2014, 2:30 PM

## 2014-07-16 NOTE — Progress Notes (Signed)
UR COMPLETED  

## 2014-07-16 NOTE — Progress Notes (Signed)
Patient for discharge to SNF bed at [facility] today. Plans confirmed with patient and family who are in agreement. SNF is prepared for patient will be transported by family via personal car. SW signing off.  Charlene Brooke, MSW Clinical Social Worker [phone]              .

## 2014-07-17 LAB — URINE CULTURE

## 2014-07-19 ENCOUNTER — Telehealth: Payer: Self-pay

## 2014-07-19 NOTE — Telephone Encounter (Signed)
Received a request for a referral for PT and OT due to weakness and increased assistance needed with ADLs.  Referral orders are labeled.  Placed in Dr. Nonda Lou red folder for review and signature.

## 2014-07-20 ENCOUNTER — Non-Acute Institutional Stay (SKILLED_NURSING_FACILITY): Payer: Medicare Other | Admitting: Adult Health

## 2014-07-20 ENCOUNTER — Encounter: Payer: Self-pay | Admitting: Adult Health

## 2014-07-20 DIAGNOSIS — I6320 Cerebral infarction due to unspecified occlusion or stenosis of unspecified precerebral arteries: Secondary | ICD-10-CM | POA: Diagnosis not present

## 2014-07-20 DIAGNOSIS — I10 Essential (primary) hypertension: Secondary | ICD-10-CM

## 2014-07-20 DIAGNOSIS — F0390 Unspecified dementia without behavioral disturbance: Secondary | ICD-10-CM

## 2014-07-20 DIAGNOSIS — B9620 Unspecified Escherichia coli [E. coli] as the cause of diseases classified elsewhere: Secondary | ICD-10-CM

## 2014-07-20 DIAGNOSIS — G510 Bell's palsy: Secondary | ICD-10-CM

## 2014-07-20 DIAGNOSIS — N390 Urinary tract infection, site not specified: Secondary | ICD-10-CM

## 2014-07-20 DIAGNOSIS — E785 Hyperlipidemia, unspecified: Secondary | ICD-10-CM | POA: Diagnosis not present

## 2014-07-20 DIAGNOSIS — A498 Other bacterial infections of unspecified site: Secondary | ICD-10-CM

## 2014-07-20 DIAGNOSIS — I6300 Cerebral infarction due to thrombosis of unspecified precerebral artery: Secondary | ICD-10-CM

## 2014-07-20 NOTE — Progress Notes (Signed)
Patient ID: Kimberly Craig, female   DOB: Apr 02, 1930, 80 y.o.   MRN: [id number]               PROGRESS NOTE  DATE: 07/20/2014  FACILITY: Nursing Home Location: [facility]  LEVEL OF CARE: SNF (31)  Acute Visit  CHIEF COMPLAINT:  Follow-up Hospitalization  HISTORY OF PRESENT ILLNESS: This is an 80 year old female who has been admitted to [facility] on 07/16/14 from [facility] with CVA (Cerebral Infarction). She has been admitted for a short-term rehabilitation.  REASSESSMENT OF ONGOING PROBLEM(S):  HTN: Pt 's HTN remains stable.  Denies CP, sob, DOE, pedal edema, headaches, dizziness or visual disturbances.  No complications from the medications currently being used.  Last BP : 121/63  UTI: The UTI remains stable.  The patient denies ongoing suprapubic pain, flank pain, dysuria, urinary frequency, urinary hesitancy or hematuria.  No complications reported from the current antibiotic being used.  DEMENTIA: The dementia remaines stable and continues to function adequately in the current living environment with supervision.  The patient has had little changes in behavior. No complications noted from the medications presently being used.  PAST MEDICAL HISTORY : Reviewed.  No changes/see problem list  CURRENT MEDICATIONS: Reviewed per MAR/see medication list  REVIEW OF SYSTEMS:  GENERAL: no change in appetite, no fatigue, no weight changes, no fever, chills or weakness RESPIRATORY: no cough, SOB, DOE, wheezing, hemoptysis CARDIAC: no chest pain, edema or palpitations GI: no abdominal pain, diarrhea, constipation, heart burn, nausea or vomiting  PHYSICAL EXAMINATION  GENERAL: no acute distress, normal body habitus EYES: conjunctivae normal, sclerae normal, normal eye lids; wear right eye patche; unable to close right eye NECK: supple, trachea midline, no neck masses, no thyroid tenderness, no thyromegaly LYMPHATICS: no LAN in the neck, no supraclavicular  LAN RESPIRATORY: breathing is even & unlabored, BS CTAB CARDIAC: RRR, no murmur,no extra heart sounds, no edema GI: abdomen soft, normal BS, no masses, no tenderness, no hepatomegaly, no splenomegaly EXTREMITIES:  Able to move all 4 extremities NEURO:  LUE weakness; right facial droop PSYCHIATRIC: the patient is alert & oriented to person, affect & behavior appropriate  LABS/RADIOLOGY: Labs reviewed: Basic Metabolic Panel:  Recent Labs  04/23/14 0927 07/12/14 1407 07/12/14 1420 07/13/14 1132  NA 140 139 141 141  K 3.6 3.0* 2.9* 3.7  CL 104 98 96 101  CO2 27 28  --  30  GLUCOSE 99 134* 124* 101*  BUN 16 9 7 8   CREATININE 1.0 0.81 0.90 0.81  CALCIUM 9.5 9.2  --  9.0  MG  --   --   --  2.1   Liver Function Tests:  Recent Labs  04/23/14 0927 07/12/14 1407 07/13/14 1132  AST 21 15 15   ALT 16 11 12   ALKPHOS 75 76 72  BILITOT 1.7* 0.9 0.9  PROT 6.9 6.5 6.3  ALBUMIN 4.1 3.5 3.4*   CBC:  Recent Labs  04/23/14 0927 07/12/14 1407 07/12/14 1420 07/13/14 1132  WBC 7.2 6.8  --  6.4  NEUTROABS 3.3 4.0  --   --   HGB 15.1* 15.4* 16.0* 15.0  HCT 45.1 45.0 47.0* 43.8  MCV 93.0 91.3  --  90.9  PLT 256.0 227  --  219   Lipid Panel:  Recent Labs  04/23/14 0927 07/13/14 0620  HDL 44.10 37*   CBG:  Recent Labs  07/12/14 1402  GLUCAP 125*   CT ABDOMEN AND PELVIS WITH CONTRAST  Technique:  Multidetector CT imaging of the abdomen and pelvis was performed following the standard protocol during bolus administration of intravenous contrast.   Contrast: 19mL OMNIPAQUE IOHEXOL 300 MG/ML  SOLN, 122mL OMNIPAQUE IOHEXOL 300 MG/ML  SOLN   Comparison: None.   Findings:   Normal hepatic contour.  Sub centimeter (approximate 7 mm) hypoattenuating lesion in the dome of the right lobe of the liver (image 90, series 2) is too small to accurately characterize a favored to represent a hepatic cyst.  Normal appearance of the gallbladder given under distension.  No intra  or extrahepatic biliary duct dilatation.  No ascites.   There is symmetric enhancement and excretion of the bilateral kidneys.  No definite renal stones on this postcontrast examination.  There is a sub centimeter (approximate 9 mm) partially exophytic hypoattenuating lesion arising from the inferior pole of the left kidney (image 43, series 2) which is too small to accurately characterize though favored to represent a minimally complex nonenhancing left sided renal cyst.  No discrete right-sided renal lesions.  No urinary obstruction or perinephric stranding.  Normal appearance of the bilateral adrenal glands and spleen. Several small splenules are incidentally noted.  The pancreas is largely atrophic.   Moderate stool burden within the rectal vault.  Scattered colonic diverticulosis without evidence of diverticulitis.  The colon is largely decompressed, however there is no evidence of enteric obstruction. Scattered colonic diverticulosis without evidence of diverticulitis.  Normal appearance of a diminutive retrocecal appendix.  No pneumoperitoneum, pneumatosis or portal venous gas.   Scattered atherosclerotic plaque within a normal caliber abdominal aorta.  The major branch vessels of the abdominal aorta appear patent on this non CTA examination.  No retroperitoneal, mesenteric, pelvic or inguinal lymphadenopathy.   Normal appearance of the retroverted uterus given age.  No discrete adnexal lesion.  No free fluid in the pelvis.   Limited visualization of the lower thorax demonstrates minimal bibasilar subpleural ground-glass atelectasis.  There is a calcified punctate granuloma within the imaged right lower lobe, the sequela of prior granulomatous infection.  No focal airspace opacities.  No pleural effusions.   Normal heart size.  Coronary calcifications.  Possible calcifications of the aortic valve leaflets.  No pericardial effusion.   No acute or aggressive osseous  abnormalities.  Mild to moderate multilevel lumbar spine DDD, likely worse at L5 - S1 with disc space height loss, end plate irregularity and sclerosis.  There is a small Schmorl's node involving the superior endplate of the C37 vertebral body with associated DDD of T11 - T12. Small bilateral mesenteric fat containing inguinal hernias.   IMPRESSION:   1.  Moderate amount of stool within the rectum.  Otherwise, no explanation for patient's lower abdominal pain.  Specifically, no evidence of enteric or urinary obstruction.   2.  Colonic diverticulosis without evidence of diverticulitis.   3.  Coronary calcifications.  Possible calcifications within the aortic valve leaflets. EXAM: CT HEAD WITHOUT CONTRAST   TECHNIQUE: Contiguous axial images were obtained from the base of the skull through the vertex without intravenous contrast.   COMPARISON:  MRI, 04/04/2012.   FINDINGS: Ventricles are normal in configuration. There is ventricular and sulcal enlargement reflecting moderate atrophy. There are no parenchymal masses or mass effect. Extensive white matter hypoattenuation is consistent with advanced chronic microvascular ischemic change. There are small lacune infarcts in the deep white matter. An old lacunar infarct is noted in the left central pons. There is no evidence of a recent infarct.   There  are no extra-axial masses or abnormal fluid collections.   No intracranial hemorrhage.   Visualized sinuses and mastoid air cells are clear. No skull lesion.   IMPRESSION: 1. No acute intracranial abnormalities. 2. Moderate atrophy, advanced chronic microvascular ischemic change, an old small deep white matter lacune infarcts and old left central pontine infarct.   EXAM: MRI HEAD WITHOUT CONTRAST   MRA HEAD WITHOUT CONTRAST   TECHNIQUE: Multiplanar, multiecho pulse sequences of the brain and surrounding structures were obtained without intravenous contrast.  Angiographic images of the head were obtained using MRA technique without contrast.   COMPARISON:  CT head 07/12/2014   FINDINGS: MRI HEAD FINDINGS   There is a subcentimeter area of acute infarction affecting the RIGHT frontal subcortical white matter (image 22 series 13). No other areas of acute infarction.   No hemorrhage, mass lesion, or extra-axial fluid hydrocephalus ex vacuo. Generalized cerebral and cerebellar atrophy. Moderate to severe T2 and FLAIR hyperintensities in the periventricular and subcortical white matter, also affecting the brainstem, consistent with chronic microvascular ischemic change. Widespread areas of lacunar infarction affect the basal ganglia and brainstem. Prominent perivascular spaces suggesting chronic hypertension.   No significant sinus or mastoid disease. Negative orbits. Sebaceous cyst over the LEFT cheek of no clinical consequence.   Compared with prior CT, this infarct is not visible.   MRA HEAD FINDINGS   The internal carotid arteries are widely patent. The basilar artery is widely patent with the RIGHT vertebral dominant. LEFT vertebral contributes to the basilar, but is diminutive following the origin of the left PICA. Mild non stenotic irregularity of the proximal anterior cerebral arteries. No proximal or distal MCA stenosis or occlusion. Fetal origin RIGHT PCA. 75% stenosis at the P1/P2 junction LEFT PCA. No cerebellar branch occlusion or intracranial aneurysm.   IMPRESSION: Acute RIGHT frontal subcortical white matter infarct. It is unclear if this relates to the patient's RIGHT facial weakness.   Advanced atrophy and small vessel disease.   No proximal stenosis of the carotid, or basilar arteries. Mild to moderate intracranial atherosclerotic change as described.   EXAM: MRI HEAD WITHOUT CONTRAST   MRA HEAD WITHOUT CONTRAST   TECHNIQUE: Multiplanar, multiecho pulse sequences of the brain and surrounding structures  were obtained without intravenous contrast. Angiographic images of the head were obtained using MRA technique without contrast.   COMPARISON:  CT head 07/12/2014   FINDINGS: MRI HEAD FINDINGS   There is a subcentimeter area of acute infarction affecting the RIGHT frontal subcortical white matter (image 22 series 13). No other areas of acute infarction.   No hemorrhage, mass lesion, or extra-axial fluid hydrocephalus ex vacuo. Generalized cerebral and cerebellar atrophy. Moderate to severe T2 and FLAIR hyperintensities in the periventricular and subcortical white matter, also affecting the brainstem, consistent with chronic microvascular ischemic change. Widespread areas of lacunar infarction affect the basal ganglia and brainstem. Prominent perivascular spaces suggesting chronic hypertension.   No significant sinus or mastoid disease. Negative orbits. Sebaceous cyst over the LEFT cheek of no clinical consequence.   Compared with prior CT, this infarct is not visible.   MRA HEAD FINDINGS   The internal carotid arteries are widely patent. The basilar artery is widely patent with the RIGHT vertebral dominant. LEFT vertebral contributes to the basilar, but is diminutive following the origin of the left PICA. Mild non stenotic irregularity of the proximal anterior cerebral arteries. No proximal or distal MCA stenosis or occlusion. Fetal origin RIGHT PCA. 75% stenosis at the P1/P2 junction LEFT PCA.  No cerebellar branch occlusion or intracranial aneurysm.   IMPRESSION: Acute RIGHT frontal subcortical white matter infarct. It is unclear if this relates to the patient's RIGHT facial weakness.   Advanced atrophy and small vessel disease.   No proximal stenosis of the carotid, or basilar arteries. Mild to moderate intracranial atherosclerotic change as described. EXAM: CHEST  2 VIEW   COMPARISON:  None.   FINDINGS: The heart size and mediastinal contours are within normal  limits. Both lungs are clear. The visualized skeletal structures are unremarkable.   IMPRESSION: No active cardiopulmonary disease.   ASSESSMENT/PLAN:  CVA (cerebral infarction) - continue Plavix; for rehabilitation Hypertension - well controlled; continue lisinopril Dementia - continue Aricept UTI - continues to have pain Hyperlipidemia - continue Lipitor Bell's Palsy - continue Valtrex and Prednisone   CPT CODE: 75102  Seth Bake - NP [facility] [phone]

## 2014-07-23 ENCOUNTER — Non-Acute Institutional Stay (SKILLED_NURSING_FACILITY): Payer: Medicare Other | Admitting: Internal Medicine

## 2014-07-23 DIAGNOSIS — F0390 Unspecified dementia without behavioral disturbance: Secondary | ICD-10-CM | POA: Diagnosis not present

## 2014-07-23 DIAGNOSIS — I10 Essential (primary) hypertension: Secondary | ICD-10-CM | POA: Diagnosis not present

## 2014-07-23 DIAGNOSIS — I6300 Cerebral infarction due to thrombosis of unspecified precerebral artery: Secondary | ICD-10-CM

## 2014-07-23 DIAGNOSIS — B9620 Unspecified Escherichia coli [E. coli] as the cause of diseases classified elsewhere: Secondary | ICD-10-CM

## 2014-07-23 DIAGNOSIS — N390 Urinary tract infection, site not specified: Secondary | ICD-10-CM | POA: Diagnosis not present

## 2014-07-23 DIAGNOSIS — I6320 Cerebral infarction due to unspecified occlusion or stenosis of unspecified precerebral arteries: Secondary | ICD-10-CM

## 2014-07-23 DIAGNOSIS — A498 Other bacterial infections of unspecified site: Secondary | ICD-10-CM

## 2014-07-25 NOTE — Telephone Encounter (Signed)
Orders signed and faxed back.

## 2014-07-26 NOTE — Progress Notes (Signed)
HISTORY & PHYSICAL  DATE: 07/23/2014   FACILITY: [facility]  LEVEL OF CARE: SNF (31)  ALLERGIES:  No Known Allergies  CHIEF COMPLAINT:  Manage CVA, dementia and UTI  HISTORY OF PRESENT ILLNESS: [name] was hospitalized secondary to an acute CVA. After hospitalization she is admitted to this facility for short-term rehabilitation.  DEMENTIA: The dementia remaines stable and continues to function adequately in the current living environment with supervision.  The patient has had little changes in behavior. No complications noted from the medications presently being used. Patient is a poor historian.  CVA: The patient's CVA remains stable.  Patient denies new neurologic symptoms such as numbness, tingling, weakness, speech difficulties or visual disturbances.  No complications reported from the medications currently being used. Patient presented to the hospital with right facial droop and slurred speech. She has left lower extremity weakness.  UTI: The UTI remains stable.  The patient denies ongoing suprapubic pain, flank pain, dysuria, urinary frequency, urinary hesitancy or hematuria.  No complications reported from the current antibiotic being used.  PAST MEDICAL HISTORY :  Past Medical History  Diagnosis Date  . Hypertension   . Hyperlipidemia   . Insomnia   . TIA (transient ischemic attack)   . Uterine cancer   . Dementia     PAST SURGICAL HISTORY: Past Surgical History  Procedure Laterality Date  . No surgical history  03/2012    SOCIAL HISTORY:  reports that she has never smoked. She has never used smokeless tobacco. She reports that she does not drink alcohol or use illicit drugs.  FAMILY HISTORY:  Family History  Problem Relation Age of Onset  . Stroke Father     CURRENT MEDICATIONS: Reviewed per MAR/see medication list  REVIEW OF SYSTEMS: Difficult to obtain-patient is a poor historian  PHYSICAL  EXAMINATION  VS:  See VS section  GENERAL: no acute distress, moderately obese body habitus EYES: Left eye -conjunctivae normal, sclerae normal, normal eye lids, right eye has a high patch-therefore unable to assess MOUTH/THROAT: lips without lesions,no lesions in the mouth,tongue is without lesions,uvula elevates in midline NECK: supple, trachea midline, no neck masses, no thyroid tenderness, no thyromegaly LYMPHATICS: no LAN in the neck, no supraclavicular LAN RESPIRATORY: breathing is even & unlabored, BS CTAB CARDIAC: RRR, no murmur,no extra heart sounds, no edema GI:  ABDOMEN: abdomen soft, normal BS, no masses, no tenderness  LIVER/SPLEEN: no hepatomegaly, no splenomegaly MUSCULOSKELETAL: HEAD: normal to inspection  EXTREMITIES: LEFT UPPER EXTREMITY: full range of motion, normal strength & tone RIGHT UPPER EXTREMITY:  full range of motion, normal strength & tone LEFT LOWER EXTREMITY:  Weakness present RIGHT LOWER EXTREMITY: Moderate range of motion, normal strength & tone PSYCHIATRIC: the patient is alert & oriented to person, affect & behavior appropriate  LABS/RADIOLOGY:  Labs reviewed: Basic Metabolic Panel:  Recent Labs  04/23/14 0927 07/12/14 1407 07/12/14 1420 07/13/14 1132  NA 140 139 141 141  K 3.6 3.0* 2.9* 3.7  CL 104 98 96 101  CO2 27 28  --  30  GLUCOSE 99 134* 124* 101*  BUN 16 9 7 8   CREATININE 1.0 0.81 0.90 0.81  CALCIUM 9.5 9.2  --  9.0  MG  --   --   --  2.1   Liver Function Tests:  Recent Labs  04/23/14 0927 07/12/14 1407 07/13/14 1132  AST 21 15 15   ALT 16 11 12   ALKPHOS 75  76 72  BILITOT 1.7* 0.9 0.9  PROT 6.9 6.5 6.3  ALBUMIN 4.1 3.5 3.4*   CBC:  Recent Labs  04/23/14 0927 07/12/14 1407 07/12/14 1420 07/13/14 1132  WBC 7.2 6.8  --  6.4  NEUTROABS 3.3 4.0  --   --   HGB 15.1* 15.4* 16.0* 15.0  HCT 45.1 45.0 47.0* 43.8  MCV 93.0 91.3  --  90.9  PLT 256.0 227  --  219   Lipid Panel:  Recent Labs  04/23/14 0927  07/13/14 0620  HDL 44.10 37*   CBG:  Recent Labs  07/12/14 1402  GLUCAP 125*    Carotid Duplex Study  Patient:    Kimberly Craig, Kimberly Craig MR #:       [id number] Study Date: 07/13/2014 Gender:     F Age:        28 Height: Weight: BSA: Pt. Status: Room:       [place], [place], [place]  [place], [place]  [place], [place]  ATTENDING    Eugenie Filler  Reports also to:  ------------------------------------------------------------------- History and indications:  Indications  781.94 Facial weakness.  784.3 Aphasia.  History  Diagnostic evaluation.  Risk factors:  Hypertension. Hyperlipidemia.  ------------------------------------------------------------------- Study information:  Study status:  Routine.  Procedure:  The right CCA, right ECA, right ICA, right vertebral, left CCA, left ECA, left ICA, and left vertebral arteries were examined. A vascular evaluation was performed with the patient in the supine position. Image quality was adequate.    Carotid duplex study.     Carotid Duplex exam including 2D imaging, color and spectral Doppler were performed on the extracranial carotid and vertebral arteries using standard established protocols.  Birthdate:  Patient birthdate: [birth year]. Age:  Patient is 80 yr old.  Sex:  Gender: female.  Study date: Study date: 07/13/2014. Study time: 10:17 AM.  Location:  Bedside. Patient status:  Inpatient.  Arterial flow:  +--------------------+---------+--------+ Location            V sys    V ed     +--------------------+---------+--------+ Right CCA - proximal60 cm/s  9 cm/s   +--------------------+---------+--------+ Right CCA - distal  64 cm/s  10 cm/s  +--------------------+---------+--------+ Right ECA           -96 cm/s -4 cm/s  +--------------------+---------+--------+ Right ICA - proximal-43 cm/s -6 cm/s   +--------------------+---------+--------+ Right ICA - distal  -67 cm/s -14 cm/s +--------------------+---------+--------+ Right vertebral     47 cm/s  10 cm/s  +--------------------+---------+--------+ Left CCA - proximal 69 cm/s  7 cm/s   +--------------------+---------+--------+ Left CCA - distal   -52 cm/s -6 cm/s  +--------------------+---------+--------+ Left ECA            -100 cm/s-7 cm/s  +--------------------+---------+--------+ Left ICA - proximal -58 cm/s -7 cm/s  +--------------------+---------+--------+ Left ICA - distal   83 cm/s  16 cm/s  +--------------------+---------+--------+ Left vertebral      -45 cm/s -6 cm/s  +--------------------+---------+--------+  ------------------------------------------------------------------- Summary: Bilateral: mild to moderate calcific plaque origin ICA with acoustic shadowing. 1-39% ICA stenosis. Vertebral artery flow is antegrade.  Transthoracic Echocardiography  Patient:    Kimberly Craig, Kimberly Craig MR #:       [id number] Study Date: 07/14/2014 Gender:     F Age:        44 Height:     172.7 cm Weight:  73.9 kg BSA:        1.89 m^2 Pt. Status: Room:       4N25C   SONOGRAPHER  Sharion Dove, RVS  ADMITTING    Debbe Odea [id number]  [id number]     [place]IZ[place], [place]  ATTENDING    Eugenie Filler  SONOGRAPHER  Mauricio Po, RDCS, CCT  PERFORMING   [facility], Inpatient  cc:  ------------------------------------------------------------------- LV EF: 65% -   70%  ------------------------------------------------------------------- Indications:      CVA 49.  ------------------------------------------------------------------- History:   Risk factors:  ASD. Hypertension. Dyslipidemia.  ------------------------------------------------------------------- Study Conclusions  - Procedure narrative: Transthoracic echocardiography. Image   quality was adequate. The study was technically  difficult, as a   result of poor patient compliance. - Left ventricle: The cavity size was normal. Wall thickness was   increased in a pattern of mild LVH. Systolic function was   vigorous. The estimated ejection fraction was in the range of 65%   to 70%. There was an increased relative contribution of atrial   contraction to ventricular filling. Doppler parameters are   consistent with abnormal left ventricular relaxation (grade 1   diastolic dysfunction). - Aortic valve: Aortic stenosis is present, but the severity cannot   be established on the basis of this study. Would recommend a   repeat limited study of the aortic valve with peak velocities and   mean gradients. Moderately calcified annulus. Mildly thickened,   moderately calcified leaflets. There was trivial regurgitation. - Mitral valve: Calcified annulus. Mildly thickened leaflets . - Atrial septum: Poorly visualized.  Transthoracic echocardiography.  M-mode, complete 2D, spectral Doppler, and color Doppler.  Birthdate:  Patient birthdate: 1930-04-19.  Age:  Patient is 80 yr old.  Sex:  Gender: female. BMI: 24.8 kg/m^2.  Blood pressure:     138/66  Patient status: Inpatient.  Study date:  Study date: 07/14/2014. Study time: 02:30 PM.  Location:  Bedside.  -------------------------------------------------------------------  ------------------------------------------------------------------- Left ventricle:  The cavity size was normal. Wall thickness was increased in a pattern of mild LVH. Systolic function was vigorous. The estimated ejection fraction was in the range of 65% to 70%. There was an increased relative contribution of atrial contraction to ventricular filling. Doppler parameters are consistent with abnormal left ventricular relaxation (grade 1 diastolic dysfunction). There was no evidence of elevated ventricular filling pressure by Doppler  parameters.  ------------------------------------------------------------------- Aortic valve:  Aortic stenosis is present, but the severity cannot be established on the basis of this study. Would recommend a repeat limited study of the aortic valve with peak velocities and mean gradients.  Moderately calcified annulus. Mildly thickened, moderately calcified leaflets.  Doppler:  There was trivial regurgitation.  ------------------------------------------------------------------- Aorta:  Aortic root: The aortic root was normal in size.  ------------------------------------------------------------------- Mitral valve:   Calcified annulus. Mildly thickened leaflets .  ------------------------------------------------------------------- Left atrium:  The atrium was normal in size.  ------------------------------------------------------------------- Atrial septum:  Poorly visualized.  ------------------------------------------------------------------- Right ventricle:  Poorly visualized. The cavity size was normal. Systolic function was normal.  ------------------------------------------------------------------- Pulmonic valve:   Poorly visualized.  Doppler:  There was no significant regurgitation.  ------------------------------------------------------------------- Tricuspid valve:  Poorly visualized.  Doppler:  There was trivial regurgitation.  ------------------------------------------------------------------- Right atrium:  Poorly visualized. The atrium was normal in size.   ------------------------------------------------------------------- Pericardium:  Not well visualized. There was no pericardial effusion.  ------------------------------------------------------------------- Systemic veins: Inferior vena cava: The vessel was normal in size. The respirophasic diameter changes were in the normal range (>= 50%), consistent  with normal central venous  pressure.  ------------------------------------------------------------------- Measurements   Left ventricle                              Value        Reference  LV ID, ED, PLAX chordal             (L)     39.6  mm     43 - 52  LV ID, ES, PLAX chordal                     24.4  mm     23 - 38  LV fx shortening, PLAX chordal              38    %      >=29  LV PW thickness, ED                         11.1  mm     ---------  IVS/LV PW ratio, ED                         0.83         <=1.3  LV e&', lateral                              8.6   cm/s   ---------  LV E/e&', lateral                            7.06         ---------  LV e&', medial                               7.6   cm/s   ---------  LV E/e&', medial                             7.99         ---------  LV e&', average                              8.1   cm/s   ---------  LV E/e&', average                            7.49         ---------    Ventricular septum                          Value        Reference  IVS thickness, ED                           9.18  mm     ---------    Aorta                                       Value        Reference  Aortic root ID, ED                          28    mm     ---------    Left atrium                                 Value        Reference  LA volume, S                                29    ml     ---------  LA volume/bsa, S                            15.3  ml/m^2 ---------    Mitral valve                                Value        Reference  Mitral E-wave peak velocity                 60.7  cm/s   ---------  Mitral A-wave peak velocity                 98.7  cm/s   ---------  Mitral deceleration time            (H)     310   ms     150 - 230  Mitral E/A ratio, peak                      0.6          ---------    Pulmonary arteries                          Value        Reference  PA pressure, S, DP                          24    mm Hg  <=30    Tricuspid valve                             Value         Reference  Tricuspid regurg peak velocity              231   cm/s   ---------  Tricuspid peak RV-RA gradient               21    mm Hg  ---------  Tricuspid maximal regurg velocity,          231   cm/s   ---------  PISA    Systemic veins                              Value        Reference  Estimated CVP  3     mm Hg  ---------    Right ventricle                             Value        Reference  RV pressure, S, DP                          24    mm Hg  <=30  RV s&', lateral, S                           16.9  cm/s   ---------  CT ABDOMEN AND PELVIS WITH CONTRAST   Technique:  Multidetector CT imaging of the abdomen and pelvis was performed following the standard protocol during bolus administration of intravenous contrast.   Contrast: 31mL OMNIPAQUE IOHEXOL 300 MG/ML  SOLN, 165mL OMNIPAQUE IOHEXOL 300 MG/ML  SOLN   Comparison: None.   Findings:   Normal hepatic contour.  Sub centimeter (approximate 7 mm) hypoattenuating lesion in the dome of the right lobe of the liver (image 90, series 2) is too small to accurately characterize a favored to represent a hepatic cyst.  Normal appearance of the gallbladder given under distension.  No intra or extrahepatic biliary duct dilatation.  No ascites.   There is symmetric enhancement and excretion of the bilateral kidneys.  No definite renal stones on this postcontrast examination.  There is a sub centimeter (approximate 9 mm) partially exophytic hypoattenuating lesion arising from the inferior pole of the left kidney (image 43, series 2) which is too small to accurately characterize though favored to represent a minimally complex nonenhancing left sided renal cyst.  No discrete right-sided renal lesions.  No urinary obstruction or perinephric stranding.  Normal appearance of the bilateral adrenal glands and spleen. Several small splenules are incidentally noted.  The pancreas is largely atrophic.    Moderate stool burden within the rectal vault.  Scattered colonic diverticulosis without evidence of diverticulitis.  The colon is largely decompressed, however there is no evidence of enteric obstruction. Scattered colonic diverticulosis without evidence of diverticulitis.  Normal appearance of a diminutive retrocecal appendix.  No pneumoperitoneum, pneumatosis or portal venous gas.   Scattered atherosclerotic plaque within a normal caliber abdominal aorta.  The major branch vessels of the abdominal aorta appear patent on this non CTA examination.  No retroperitoneal, mesenteric, pelvic or inguinal lymphadenopathy.   Normal appearance of the retroverted uterus given age.  No discrete adnexal lesion.  No free fluid in the pelvis.   Limited visualization of the lower thorax demonstrates minimal bibasilar subpleural ground-glass atelectasis.  There is a calcified punctate granuloma within the imaged right lower lobe, the sequela of prior granulomatous infection.  No focal airspace opacities.  No pleural effusions.   Normal heart size.  Coronary calcifications.  Possible calcifications of the aortic valve leaflets.  No pericardial effusion.   No acute or aggressive osseous abnormalities.  Mild to moderate multilevel lumbar spine DDD, likely worse at L5 - S1 with disc space height loss, end plate irregularity and sclerosis.  There is a small Schmorl's node involving the superior endplate of the Y19 vertebral body with associated DDD of T11 - T12. Small bilateral mesenteric fat containing inguinal hernias.   IMPRESSION:   1.  Moderate amount of stool within the rectum.  Otherwise, no explanation for patient's lower abdominal  pain.  Specifically, no evidence of enteric or urinary obstruction.   2.  Colonic diverticulosis without evidence of diverticulitis.   3.  Coronary calcifications.  Possible calcifications within the aortic valve leaflets.   CT HEAD WITHOUT CONTRAST    TECHNIQUE: Contiguous axial images were obtained from the base of the skull through the vertex without intravenous contrast.   COMPARISON:  MRI, 04/04/2012.   FINDINGS: Ventricles are normal in configuration. There is ventricular and sulcal enlargement reflecting moderate atrophy. There are no parenchymal masses or mass effect. Extensive white matter hypoattenuation is consistent with advanced chronic microvascular ischemic change. There are small lacune infarcts in the deep white matter. An old lacunar infarct is noted in the left central pons. There is no evidence of a recent infarct.   There are no extra-axial masses or abnormal fluid collections.   No intracranial hemorrhage.   Visualized sinuses and mastoid air cells are clear. No skull lesion.   IMPRESSION: 1. No acute intracranial abnormalities. 2. Moderate atrophy, advanced chronic microvascular ischemic change, an old small deep white matter lacune infarcts and old left central pontine infarct.   MRI HEAD WITHOUT CONTRAST   MRA HEAD WITHOUT CONTRAST   TECHNIQUE: Multiplanar, multiecho pulse sequences of the brain and surrounding structures were obtained without intravenous contrast. Angiographic images of the head were obtained using MRA technique without contrast.   COMPARISON:  CT head 07/12/2014   FINDINGS: MRI HEAD FINDINGS   There is a subcentimeter area of acute infarction affecting the RIGHT frontal subcortical white matter (image 22 series 13). No other areas of acute infarction.   No hemorrhage, mass lesion, or extra-axial fluid hydrocephalus ex vacuo. Generalized cerebral and cerebellar atrophy. Moderate to severe T2 and FLAIR hyperintensities in the periventricular and subcortical white matter, also affecting the brainstem, consistent with chronic microvascular ischemic change. Widespread areas of lacunar infarction affect the basal ganglia and brainstem. Prominent perivascular spaces  suggesting chronic hypertension.   No significant sinus or mastoid disease. Negative orbits. Sebaceous cyst over the LEFT cheek of no clinical consequence.   Compared with prior CT, this infarct is not visible.   MRA HEAD FINDINGS   The internal carotid arteries are widely patent. The basilar artery is widely patent with the RIGHT vertebral dominant. LEFT vertebral contributes to the basilar, but is diminutive following the origin of the left PICA. Mild non stenotic irregularity of the proximal anterior cerebral arteries. No proximal or distal MCA stenosis or occlusion. Fetal origin RIGHT PCA. 75% stenosis at the P1/P2 junction LEFT PCA. No cerebellar branch occlusion or intracranial aneurysm.   IMPRESSION: Acute RIGHT frontal subcortical white matter infarct. It is unclear if this relates to the patient's RIGHT facial weakness.   Advanced atrophy and small vessel disease.   No proximal stenosis of the carotid, or basilar arteries. Mild to moderate intracranial atherosclerotic change as described.   CHEST  2 VIEW   COMPARISON:  None.   FINDINGS: The heart size and mediastinal contours are within normal limits. Both lungs are clear. The visualized skeletal structures are unremarkable.   IMPRESSION: No active cardiopulmonary disease.    ASSESSMENT/PLAN:  Right frontal subcortical and white matter infarct-continue plavix and rehabilitation UTI- completed Ceftin as prescribed. No further symptoms. Alzheimer's dementia-continue Aricept Hypertension-well-controlled Bell palsy-continue prednisone Hyperlipidemia-continue Lipitor  I have reviewed patient's medical records received at admission/from hospitalization.  CPT CODE: 86767  Gayani Y Dasanayaka, [place] [phone]

## 2014-08-01 ENCOUNTER — Encounter: Payer: Self-pay | Admitting: Adult Health

## 2014-08-06 ENCOUNTER — Non-Acute Institutional Stay (SKILLED_NURSING_FACILITY): Payer: Medicare Other | Admitting: Adult Health

## 2014-08-06 ENCOUNTER — Encounter: Payer: Self-pay | Admitting: Adult Health

## 2014-08-06 DIAGNOSIS — E785 Hyperlipidemia, unspecified: Secondary | ICD-10-CM

## 2014-08-06 DIAGNOSIS — G510 Bell's palsy: Secondary | ICD-10-CM

## 2014-08-06 DIAGNOSIS — I6320 Cerebral infarction due to unspecified occlusion or stenosis of unspecified precerebral arteries: Secondary | ICD-10-CM

## 2014-08-06 DIAGNOSIS — F0390 Unspecified dementia without behavioral disturbance: Secondary | ICD-10-CM

## 2014-08-06 DIAGNOSIS — I10 Essential (primary) hypertension: Secondary | ICD-10-CM

## 2014-08-06 DIAGNOSIS — I6300 Cerebral infarction due to thrombosis of unspecified precerebral artery: Secondary | ICD-10-CM

## 2014-08-06 NOTE — Progress Notes (Addendum)
Patient ID: Kimberly Craig, female   DOB: Jan 10, 1930, 80 y.o.   MRN: [id number]              PROGRESS NOTE  DATE:       08/06/14  FACILITY: Nursing Home Location: [facility]  LEVEL OF CARE: SNF (31)  Acute Visit  CHIEF COMPLAINT:  Discharge Notes  HISTORY OF PRESENT ILLNESS: This is an 80 year old female who is for discharge to [facility] with Home health PT, OT, ST, Nursing and Home health Aide. She has been admitted to [facility] on 07/16/14 from [facility] with CVA (Cerebral Infarction). Patient was admitted to this facility for short-term rehabilitation after the patient's recent hospitalization.  Patient has completed SNF rehabilitation and therapy has cleared the patient for discharge.  REASSESSMENT OF ONGOING PROBLEM(S):  HTN: Pt 's HTN remains stable.  Denies CP, sob, DOE, pedal edema, headaches, dizziness or visual disturbances.  No complications from the medications currently being used.  Last BP : 138/81  CVA: The patient's CVA remains stable.  Patient denies new neurologic symptoms such as numbness, tingling, weakness, speech difficulties or visual disturbances.  No complications reported from the medications currently being used.  HYPERLIPIDEMIA: No complications from the medications presently being used. Last fasting lipid panel showed : is not available  PAST MEDICAL HISTORY : Reviewed.  No changes/see problem list  CURRENT MEDICATIONS: Reviewed per MAR/see medication list  REVIEW OF SYSTEMS:  GENERAL: no change in appetite, no fatigue, no weight changes, no fever, chills or weakness RESPIRATORY: no cough, SOB, DOE, wheezing, hemoptysis CARDIAC: no chest pain, edema or palpitations GI: no abdominal pain, diarrhea, constipation, heart burn, nausea or vomiting  PHYSICAL EXAMINATION  GENERAL: no acute distress, normal body habitus EYES: conjunctivae normal, sclerae normal, normal eye lids; wear right eye patche; unable to close right  eye NECK: supple, trachea midline, no neck masses, no thyroid tenderness, no thyromegaly RESPIRATORY: breathing is even & unlabored, BS CTAB CARDIAC: RRR, no murmur,no extra heart sounds, no edema GI: abdomen soft, normal BS, no masses, no tenderness, no hepatomegaly, no splenomegaly EXTREMITIES:  Able to move all 4 extremities NEURO:  LUE weakness; right facial droop PSYCHIATRIC: the patient is alert & oriented to person, affect & behavior appropriate  LABS/RADIOLOGY: Labs reviewed: Basic Metabolic Panel:  Recent Labs  06/28/14 1334 07/12/14 1407 07/12/14 1420 07/13/14 1132  NA 140 139 141 141  K 4.3 3.0* 2.9* 3.7  CL 99 98 96 101  CO2 26 28  --  30  GLUCOSE 121* 134* 124* 101*  BUN 36* 9 7 8   CREATININE 0.82 0.81 0.90 0.81  CALCIUM 9.8 9.2  --  9.0  MG  --   --   --  2.1   Liver Function Tests:  Recent Labs  06/28/14 1334 07/12/14 1407 07/13/14 1132  AST 17 15 15   ALT 11 11 12   ALKPHOS 76 76 72  BILITOT 1.9* 0.9 0.9  PROT 7.5 6.5 6.3  ALBUMIN 4.1 3.5 3.4*   CBC:  Recent Labs  04/23/14 0927 06/28/14 1334 07/12/14 1407 07/12/14 1420 07/13/14 1132  WBC 7.2 9.8 6.8  --  6.4  NEUTROABS 3.3 6.7 4.0  --   --   HGB 15.1* 17.1* 15.4* 16.0* 15.0  HCT 45.1 50.0* 45.0 47.0* 43.8  MCV 93.0 91.9 91.3  --  90.9  PLT 256.0 223 227  --  219   Lipid Panel:  Recent Labs  04/23/14 0927 07/13/14 2426  HDL 44.10 37*   CBG:  Recent Labs  07/12/14 1402  GLUCAP 125*   CT ABDOMEN AND PELVIS WITH CONTRAST   Technique:  Multidetector CT imaging of the abdomen and pelvis was performed following the standard protocol during bolus administration of intravenous contrast.   Contrast: 55mL OMNIPAQUE IOHEXOL 300 MG/ML  SOLN, 137mL OMNIPAQUE IOHEXOL 300 MG/ML  SOLN   Comparison: None.   Findings:   Normal hepatic contour.  Sub centimeter (approximate 7 mm) hypoattenuating lesion in the dome of the right lobe of the liver (image 90, series 2) is too small to  accurately characterize a favored to represent a hepatic cyst.  Normal appearance of the gallbladder given under distension.  No intra or extrahepatic biliary duct dilatation.  No ascites.   There is symmetric enhancement and excretion of the bilateral kidneys.  No definite renal stones on this postcontrast examination.  There is a sub centimeter (approximate 9 mm) partially exophytic hypoattenuating lesion arising from the inferior pole of the left kidney (image 43, series 2) which is too small to accurately characterize though favored to represent a minimally complex nonenhancing left sided renal cyst.  No discrete right-sided renal lesions.  No urinary obstruction or perinephric stranding.  Normal appearance of the bilateral adrenal glands and spleen. Several small splenules are incidentally noted.  The pancreas is largely atrophic.   Moderate stool burden within the rectal vault.  Scattered colonic diverticulosis without evidence of diverticulitis.  The colon is largely decompressed, however there is no evidence of enteric obstruction. Scattered colonic diverticulosis without evidence of diverticulitis.  Normal appearance of a diminutive retrocecal appendix.  No pneumoperitoneum, pneumatosis or portal venous gas.   Scattered atherosclerotic plaque within a normal caliber abdominal aorta.  The major branch vessels of the abdominal aorta appear patent on this non CTA examination.  No retroperitoneal, mesenteric, pelvic or inguinal lymphadenopathy.   Normal appearance of the retroverted uterus given age.  No discrete adnexal lesion.  No free fluid in the pelvis.   Limited visualization of the lower thorax demonstrates minimal bibasilar subpleural ground-glass atelectasis.  There is a calcified punctate granuloma within the imaged right lower lobe, the sequela of prior granulomatous infection.  No focal airspace opacities.  No pleural effusions.   Normal heart size.  Coronary  calcifications.  Possible calcifications of the aortic valve leaflets.  No pericardial effusion.   No acute or aggressive osseous abnormalities.  Mild to moderate multilevel lumbar spine DDD, likely worse at L5 - S1 with disc space height loss, end plate irregularity and sclerosis.  There is a small Schmorl's node involving the superior endplate of the S28 vertebral body with associated DDD of T11 - T12. Small bilateral mesenteric fat containing inguinal hernias.   IMPRESSION:   1.  Moderate amount of stool within the rectum.  Otherwise, no explanation for patient's lower abdominal pain.  Specifically, no evidence of enteric or urinary obstruction.   2.  Colonic diverticulosis without evidence of diverticulitis.   3.  Coronary calcifications.  Possible calcifications within the aortic valve leaflets. EXAM: CT HEAD WITHOUT CONTRAST   TECHNIQUE: Contiguous axial images were obtained from the base of the skull through the vertex without intravenous contrast.   COMPARISON:  MRI, 04/04/2012.   FINDINGS: Ventricles are normal in configuration. There is ventricular and sulcal enlargement reflecting moderate atrophy. There are no parenchymal masses or mass effect. Extensive white matter hypoattenuation is consistent with advanced chronic microvascular ischemic change. There are small lacune infarcts in the  deep white matter. An old lacunar infarct is noted in the left central pons. There is no evidence of a recent infarct.   There are no extra-axial masses or abnormal fluid collections.   No intracranial hemorrhage.   Visualized sinuses and mastoid air cells are clear. No skull lesion.   IMPRESSION: 1. No acute intracranial abnormalities. 2. Moderate atrophy, advanced chronic microvascular ischemic change, an old small deep white matter lacune infarcts and old left central pontine infarct.   EXAM: MRI HEAD WITHOUT CONTRAST   MRA HEAD WITHOUT CONTRAST    TECHNIQUE: Multiplanar, multiecho pulse sequences of the brain and surrounding structures were obtained without intravenous contrast. Angiographic images of the head were obtained using MRA technique without contrast.   COMPARISON:  CT head 07/12/2014   FINDINGS: MRI HEAD FINDINGS   There is a subcentimeter area of acute infarction affecting the RIGHT frontal subcortical white matter (image 22 series 13). No other areas of acute infarction.   No hemorrhage, mass lesion, or extra-axial fluid hydrocephalus ex vacuo. Generalized cerebral and cerebellar atrophy. Moderate to severe T2 and FLAIR hyperintensities in the periventricular and subcortical white matter, also affecting the brainstem, consistent with chronic microvascular ischemic change. Widespread areas of lacunar infarction affect the basal ganglia and brainstem. Prominent perivascular spaces suggesting chronic hypertension.   No significant sinus or mastoid disease. Negative orbits. Sebaceous cyst over the LEFT cheek of no clinical consequence.   Compared with prior CT, this infarct is not visible.   MRA HEAD FINDINGS   The internal carotid arteries are widely patent. The basilar artery is widely patent with the RIGHT vertebral dominant. LEFT vertebral contributes to the basilar, but is diminutive following the origin of the left PICA. Mild non stenotic irregularity of the proximal anterior cerebral arteries. No proximal or distal MCA stenosis or occlusion. Fetal origin RIGHT PCA. 75% stenosis at the P1/P2 junction LEFT PCA. No cerebellar branch occlusion or intracranial aneurysm.   IMPRESSION: Acute RIGHT frontal subcortical white matter infarct. It is unclear if this relates to the patient's RIGHT facial weakness.   Advanced atrophy and small vessel disease.   No proximal stenosis of the carotid, or basilar arteries. Mild to moderate intracranial atherosclerotic change as described.   EXAM: MRI HEAD WITHOUT  CONTRAST   MRA HEAD WITHOUT CONTRAST   TECHNIQUE: Multiplanar, multiecho pulse sequences of the brain and surrounding structures were obtained without intravenous contrast. Angiographic images of the head were obtained using MRA technique without contrast.   COMPARISON:  CT head 07/12/2014   FINDINGS: MRI HEAD FINDINGS   There is a subcentimeter area of acute infarction affecting the RIGHT frontal subcortical white matter (image 22 series 13). No other areas of acute infarction.   No hemorrhage, mass lesion, or extra-axial fluid hydrocephalus ex vacuo. Generalized cerebral and cerebellar atrophy. Moderate to severe T2 and FLAIR hyperintensities in the periventricular and subcortical white matter, also affecting the brainstem, consistent with chronic microvascular ischemic change. Widespread areas of lacunar infarction affect the basal ganglia and brainstem. Prominent perivascular spaces suggesting chronic hypertension.   No significant sinus or mastoid disease. Negative orbits. Sebaceous cyst over the LEFT cheek of no clinical consequence.   Compared with prior CT, this infarct is not visible.   MRA HEAD FINDINGS   The internal carotid arteries are widely patent. The basilar artery is widely patent with the RIGHT vertebral dominant. LEFT vertebral contributes to the basilar, but is diminutive following the origin of the left PICA. Mild non stenotic irregularity of  the proximal anterior cerebral arteries. No proximal or distal MCA stenosis or occlusion. Fetal origin RIGHT PCA. 75% stenosis at the P1/P2 junction LEFT PCA. No cerebellar branch occlusion or intracranial aneurysm.   IMPRESSION: Acute RIGHT frontal subcortical white matter infarct. It is unclear if this relates to the patient's RIGHT facial weakness.   Advanced atrophy and small vessel disease.   No proximal stenosis of the carotid, or basilar arteries. Mild to moderate intracranial atherosclerotic change  as described. EXAM: CHEST  2 VIEW   COMPARISON:  None.   FINDINGS: The heart size and mediastinal contours are within normal limits. Both lungs are clear. The visualized skeletal structures are unremarkable.   IMPRESSION: No active cardiopulmonary disease.   ASSESSMENT/PLAN:  CVA (cerebral infarction) - continue Plavix  Hypertension - well controlled; continue lisinopril Dementia - continue Aricept Hyperlipidemia - continue Lipitor Bell's Palsy - recently completed Valtrex and Prednisone   I have filled out patient's discharge paperwork and written prescriptions.  Patient will receive home health PT, OT, ST, Nursing and Home health Aide.  Total discharge time: Less than 30 minutes  Discharge time involved coordination of the discharge process with Education officer, museum, nursing staff and therapy department. Medical justification for home health services verified.    CPT CODE: 44010  Seth Bake - NP [facility] [phone]

## 2014-08-08 DIAGNOSIS — G309 Alzheimer's disease, unspecified: Secondary | ICD-10-CM | POA: Diagnosis not present

## 2014-08-08 DIAGNOSIS — I69959 Hemiplegia and hemiparesis following unspecified cerebrovascular disease affecting unspecified side: Secondary | ICD-10-CM | POA: Diagnosis not present

## 2014-08-08 DIAGNOSIS — I10 Essential (primary) hypertension: Secondary | ICD-10-CM | POA: Diagnosis not present

## 2014-08-08 DIAGNOSIS — I69354 Hemiplegia and hemiparesis following cerebral infarction affecting left non-dominant side: Secondary | ICD-10-CM | POA: Diagnosis not present

## 2014-08-08 DIAGNOSIS — F0280 Dementia in other diseases classified elsewhere without behavioral disturbance: Secondary | ICD-10-CM | POA: Diagnosis not present

## 2014-08-08 DIAGNOSIS — G510 Bell's palsy: Secondary | ICD-10-CM

## 2014-08-08 DIAGNOSIS — I69322 Dysarthria following cerebral infarction: Secondary | ICD-10-CM | POA: Diagnosis not present

## 2014-08-08 DIAGNOSIS — I69922 Dysarthria following unspecified cerebrovascular disease: Secondary | ICD-10-CM

## 2014-08-12 ENCOUNTER — Telehealth: Payer: Self-pay | Admitting: Family Medicine

## 2014-08-12 DIAGNOSIS — I10 Essential (primary) hypertension: Secondary | ICD-10-CM | POA: Diagnosis not present

## 2014-08-12 DIAGNOSIS — G309 Alzheimer's disease, unspecified: Secondary | ICD-10-CM | POA: Diagnosis not present

## 2014-08-12 DIAGNOSIS — I69322 Dysarthria following cerebral infarction: Secondary | ICD-10-CM | POA: Diagnosis not present

## 2014-08-12 DIAGNOSIS — G510 Bell's palsy: Secondary | ICD-10-CM | POA: Diagnosis not present

## 2014-08-12 DIAGNOSIS — F0280 Dementia in other diseases classified elsewhere without behavioral disturbance: Secondary | ICD-10-CM | POA: Diagnosis not present

## 2014-08-12 DIAGNOSIS — I69354 Hemiplegia and hemiparesis following cerebral infarction affecting left non-dominant side: Secondary | ICD-10-CM | POA: Diagnosis not present

## 2014-08-13 ENCOUNTER — Telehealth: Payer: Self-pay | Admitting: Family Medicine

## 2014-08-13 NOTE — Telephone Encounter (Signed)
Verbal given.     KP 

## 2014-08-13 NOTE — Telephone Encounter (Signed)
Caller name:[facility] Relation to UY:QIHK Call back number:[phone] Pharmacy:  Reason for call: pt has been evaluated for home health physical therapy, and pt is needing a verbal order for 8 wks twice a week. Therapy will be for strengthen, transverse, balance, gait training.

## 2014-08-14 DIAGNOSIS — G309 Alzheimer's disease, unspecified: Secondary | ICD-10-CM | POA: Diagnosis not present

## 2014-08-14 DIAGNOSIS — I69322 Dysarthria following cerebral infarction: Secondary | ICD-10-CM | POA: Diagnosis not present

## 2014-08-14 DIAGNOSIS — F0280 Dementia in other diseases classified elsewhere without behavioral disturbance: Secondary | ICD-10-CM | POA: Diagnosis not present

## 2014-08-14 DIAGNOSIS — I69354 Hemiplegia and hemiparesis following cerebral infarction affecting left non-dominant side: Secondary | ICD-10-CM | POA: Diagnosis not present

## 2014-08-14 DIAGNOSIS — G510 Bell's palsy: Secondary | ICD-10-CM | POA: Diagnosis not present

## 2014-08-14 DIAGNOSIS — I10 Essential (primary) hypertension: Secondary | ICD-10-CM | POA: Diagnosis not present

## 2014-08-14 NOTE — Telephone Encounter (Signed)
Caller name: Mallory  Relation to pt: [facility]  Call back number: [phone]   Reason for call:    Requesting verbal orders for OT  2x week for 4 week

## 2014-08-14 NOTE — Telephone Encounter (Signed)
Verbal left to perform OT per nursing orders.      KP

## 2014-08-16 DIAGNOSIS — I69322 Dysarthria following cerebral infarction: Secondary | ICD-10-CM | POA: Diagnosis not present

## 2014-08-16 DIAGNOSIS — G309 Alzheimer's disease, unspecified: Secondary | ICD-10-CM | POA: Diagnosis not present

## 2014-08-16 DIAGNOSIS — I69354 Hemiplegia and hemiparesis following cerebral infarction affecting left non-dominant side: Secondary | ICD-10-CM | POA: Diagnosis not present

## 2014-08-16 DIAGNOSIS — G510 Bell's palsy: Secondary | ICD-10-CM | POA: Diagnosis not present

## 2014-08-16 DIAGNOSIS — I10 Essential (primary) hypertension: Secondary | ICD-10-CM | POA: Diagnosis not present

## 2014-08-16 DIAGNOSIS — F0280 Dementia in other diseases classified elsewhere without behavioral disturbance: Secondary | ICD-10-CM | POA: Diagnosis not present

## 2014-08-19 ENCOUNTER — Encounter: Payer: Self-pay | Admitting: Neurology

## 2014-08-19 ENCOUNTER — Ambulatory Visit (INDEPENDENT_AMBULATORY_CARE_PROVIDER_SITE_OTHER): Payer: Medicare Other | Admitting: Neurology

## 2014-08-19 VITALS — BP 182/90 | HR 75 | Wt 160.2 lb

## 2014-08-19 DIAGNOSIS — I639 Cerebral infarction, unspecified: Secondary | ICD-10-CM | POA: Diagnosis not present

## 2014-08-19 DIAGNOSIS — G510 Bell's palsy: Secondary | ICD-10-CM | POA: Diagnosis not present

## 2014-08-19 DIAGNOSIS — I6300 Cerebral infarction due to thrombosis of unspecified precerebral artery: Secondary | ICD-10-CM | POA: Diagnosis not present

## 2014-08-19 DIAGNOSIS — I69322 Dysarthria following cerebral infarction: Secondary | ICD-10-CM | POA: Diagnosis not present

## 2014-08-19 DIAGNOSIS — I10 Essential (primary) hypertension: Secondary | ICD-10-CM | POA: Diagnosis not present

## 2014-08-19 DIAGNOSIS — E785 Hyperlipidemia, unspecified: Secondary | ICD-10-CM

## 2014-08-19 DIAGNOSIS — F0280 Dementia in other diseases classified elsewhere without behavioral disturbance: Secondary | ICD-10-CM | POA: Diagnosis not present

## 2014-08-19 DIAGNOSIS — I69354 Hemiplegia and hemiparesis following cerebral infarction affecting left non-dominant side: Secondary | ICD-10-CM | POA: Diagnosis not present

## 2014-08-19 DIAGNOSIS — G309 Alzheimer's disease, unspecified: Secondary | ICD-10-CM | POA: Diagnosis not present

## 2014-08-19 NOTE — Progress Notes (Signed)
STROKE NEUROLOGY FOLLOW UP NOTE  NAME: Kimberly Craig DOB: 08-Nov-1930  REASON FOR VISIT: stroke follow up HISTORY FROM: chart  Today we had the pleasure of seeing Kimberly Craig in follow-up at our Neurology [facility]. Pt was accompanied by son.   History Summary Kimberly Craig is a 80 y.o. female with hx of Alzheimer's dementia on aricept, hypertension, hyperlipidemia, and previous TIA was admitted on 07/12/14 for right facial droop and mild dysarthria. She was also found to have difficulty closing right eye and consistent with bell's palsy. However, MRI showed one acute lacunar stroke at right frontal subcortical region, which basically an incidental finding and not correlate to her symptoms. She was switched from ASA 325mg  to plavix on discharge and also started with prednisone and valacyclovir for 7 days for bell's palsy.   Interval History During the interval time, the patient has been doing much better. She was initially in rehab and now back to her ALF. Her bell's palsy has resolved and only thing son complained is urinary incontinence which son thought due to dementia. She is continued on aricept.  However, she still on ASA 325mg  and plavix 75mg  daily. Not sure why but will d/c ASA as previously planned.  Her BP was high today in clinic 182/90, she is not on BP meds and has not seen Dr. Etter Sjogren yet after discharge.  REVIEW OF SYSTEMS: Full 14 system review of systems performed and notable only for those listed below and in HPI above, all others are negative:  Constitutional: N/A  Cardiovascular: N/A  Ear/Nose/Throat: N/A  Skin: N/A  Eyes: N/A  Respiratory: N/A  Gastroitestinal: N/A  Genitourinary: urinary incontinence Hematology/Lymphatic: N/A  Endocrine: N/A  Musculoskeletal: N/A  Allergy/Immunology: N/A  Neurological: memory loss, confusion Psychiatric: disinterest in activities  The following represents the patient's updated allergies and side effects list: No Known  Allergies  Labs since last visit of relevance include the following: Results for orders placed during the hospital encounter of 07/12/14  URINE CULTURE      Result Value Ref Range   Specimen Description URINE, RANDOM     Special Requests NONE     Culture  Setup Time       Value: 07/14/2014 15:03     Performed at [facility] Count       Value: >=100,000 COLONIES/ML     Performed at [facility]   Culture       Value: ESCHERICHIA COLI     Performed at [facility]   Report Status 07/17/2014 FINAL     Organism ID, Bacteria ESCHERICHIA COLI    CBC      Result Value Ref Range   WBC 6.8  4.0 - 10.5 K/uL   RBC 4.93  3.87 - 5.11 MIL/uL   Hemoglobin 15.4 (*) 12.0 - 15.0 g/dL   HCT 45.0  36.0 - 46.0 %   MCV 91.3  78.0 - 100.0 fL   MCH 31.2  26.0 - 34.0 pg   MCHC 34.2  30.0 - 36.0 g/dL   RDW 13.3  11.5 - 15.5 %   Platelets 227  150 - 400 K/uL  COMPREHENSIVE METABOLIC PANEL      Result Value Ref Range   Sodium 139  137 - 147 mEq/L   Potassium 3.0 (*) 3.7 - 5.3 mEq/L   Chloride 98  96 - 112 mEq/L   CO2 28  19 - 32 mEq/L   Glucose, Bld  134 (*) 70 - 99 mg/dL   BUN 9  6 - 23 mg/dL   Creatinine, Ser 0.81  0.50 - 1.10 mg/dL   Calcium 9.2  8.4 - 10.5 mg/dL   Total Protein 6.5  6.0 - 8.3 g/dL   Albumin 3.5  3.5 - 5.2 g/dL   AST 15  0 - 37 U/L   ALT 11  0 - 35 U/L   Alkaline Phosphatase 76  39 - 117 U/L   Total Bilirubin 0.9  0.3 - 1.2 mg/dL   GFR calc non Af Amer 66 (*) >90 mL/min   GFR calc Af Amer 76 (*) >90 mL/min   Anion gap 13  5 - 15  URINALYSIS, ROUTINE W REFLEX MICROSCOPIC      Result Value Ref Range   Color, Urine YELLOW  YELLOW   APPearance CLEAR  CLEAR   Specific Gravity, Urine 1.006  1.005 - 1.030   pH 6.5  5.0 - 8.0   Glucose, UA NEGATIVE  NEGATIVE mg/dL   Hgb urine dipstick NEGATIVE  NEGATIVE   Bilirubin Urine NEGATIVE  NEGATIVE   Ketones, ur NEGATIVE  NEGATIVE mg/dL   Protein, ur NEGATIVE  NEGATIVE mg/dL   Urobilinogen, UA 1.0   0.0 - 1.0 mg/dL   Nitrite NEGATIVE  NEGATIVE   Leukocytes, UA MODERATE (*) NEGATIVE  ETHANOL      Result Value Ref Range   Alcohol, Ethyl (B) <11  0 - 11 mg/dL  PROTIME-INR      Result Value Ref Range   Prothrombin Time 13.9  11.6 - 15.2 seconds   INR 1.07  0.00 - 1.49  APTT      Result Value Ref Range   aPTT 32  24 - 37 seconds  URINE RAPID DRUG SCREEN (HOSP PERFORMED)      Result Value Ref Range   Opiates NONE DETECTED  NONE DETECTED   Cocaine NONE DETECTED  NONE DETECTED   Benzodiazepines NONE DETECTED  NONE DETECTED   Amphetamines NONE DETECTED  NONE DETECTED   Tetrahydrocannabinol NONE DETECTED  NONE DETECTED   Barbiturates NONE DETECTED  NONE DETECTED  DIFFERENTIAL      Result Value Ref Range   Neutrophils Relative % 59  43 - 77 %   Neutro Abs 4.0  1.7 - 7.7 K/uL   Lymphocytes Relative 29  12 - 46 %   Lymphs Abs 2.0  0.7 - 4.0 K/uL   Monocytes Relative 11  3 - 12 %   Monocytes Absolute 0.8  0.1 - 1.0 K/uL   Eosinophils Relative 1  0 - 5 %   Eosinophils Absolute 0.1  0.0 - 0.7 K/uL   Basophils Relative 0  0 - 1 %   Basophils Absolute 0.0  0.0 - 0.1 K/uL  LIPID PANEL      Result Value Ref Range   Cholesterol 119  0 - 200 mg/dL   Triglycerides 106  <150 mg/dL   HDL 37 (*) >39 mg/dL   Total CHOL/HDL Ratio 3.2     VLDL 21  0 - 40 mg/dL   LDL Cholesterol 61  0 - 99 mg/dL  HEMOGLOBIN A1C      Result Value Ref Range   Hemoglobin A1C 6.2 (*) <5.7 %   Mean Plasma Glucose 131 (*) <117 mg/dL  COMPREHENSIVE METABOLIC PANEL      Result Value Ref Range   Sodium 141  137 - 147 mEq/L   Potassium 3.7  3.7 - 5.3 mEq/L  Chloride 101  96 - 112 mEq/L   CO2 30  19 - 32 mEq/L   Glucose, Bld 101 (*) 70 - 99 mg/dL   BUN 8  6 - 23 mg/dL   Creatinine, Ser 0.81  0.50 - 1.10 mg/dL   Calcium 9.0  8.4 - 10.5 mg/dL   Total Protein 6.3  6.0 - 8.3 g/dL   Albumin 3.4 (*) 3.5 - 5.2 g/dL   AST 15  0 - 37 U/L   ALT 12  0 - 35 U/L   Alkaline Phosphatase 72  39 - 117 U/L   Total Bilirubin  0.9  0.3 - 1.2 mg/dL   GFR calc non Af Amer 66 (*) >90 mL/min   GFR calc Af Amer 76 (*) >90 mL/min   Anion gap 10  5 - 15  MAGNESIUM      Result Value Ref Range   Magnesium 2.1  1.5 - 2.5 mg/dL  CBC      Result Value Ref Range   WBC 6.4  4.0 - 10.5 K/uL   RBC 4.82  3.87 - 5.11 MIL/uL   Hemoglobin 15.0  12.0 - 15.0 g/dL   HCT 43.8  36.0 - 46.0 %   MCV 90.9  78.0 - 100.0 fL   MCH 31.1  26.0 - 34.0 pg   MCHC 34.2  30.0 - 36.0 g/dL   RDW 13.6  11.5 - 15.5 %   Platelets 219  150 - 400 K/uL  URINE MICROSCOPIC-ADD ON      Result Value Ref Range   Squamous Epithelial / LPF FEW (*) RARE   WBC, UA 11-20  <3 WBC/hpf   Bacteria, UA FEW (*) RARE  CBG MONITORING, ED      Result Value Ref Range   Glucose-Capillary 125 (*) 70 - 99 mg/dL  I-STAT CHEM 8, ED      Result Value Ref Range   Sodium 141  137 - 147 mEq/L   Potassium 2.9 (*) 3.7 - 5.3 mEq/L   Chloride 96  96 - 112 mEq/L   BUN 7  6 - 23 mg/dL   Creatinine, Ser 0.90  0.50 - 1.10 mg/dL   Glucose, Bld 124 (*) 70 - 99 mg/dL   Calcium, Ion 1.18  1.13 - 1.30 mmol/L   TCO2 30  0 - 100 mmol/L   Hemoglobin 16.0 (*) 12.0 - 15.0 g/dL   HCT 47.0 (*) 36.0 - 46.0 %   Comment NOTIFIED PHYSICIAN    I-STAT TROPOININ, ED      Result Value Ref Range   Troponin i, poc 0.00  0.00 - 0.08 ng/mL   Comment 3             The neurologically relevant items on the patient's problem list were reviewed on today's visit.  Neurologic Examination  A problem focused neurological exam (12 or more points of the single system neurologic examination, vital signs counts as 1 point, cranial nerves count for 8 points) was performed.  Blood pressure 182/90, pulse 75, weight 160 lb 3.2 oz (72.666 kg).  General - Well nourished, well developed, in no apparent distress, pleasant.  Ophthalmologic - not able to see through.  Cardiovascular - Regular rate and rhythm with no murmur.  Mental Status -  Level of arousal and orientation to place, and person were intact,  but not to time. Language including expression, naming, repetition, comprehension was assessed and found intact. Recent and remote memory were assessed and registration 3/3, delayed recall  2/3. Kimberly Craig of Knowledge was assessed and was able to tell "Obama" as current president, but not able to recall previous presidents.  Cranial Nerves II - XII - II - Visual field intact OU. III, IV, VI - Extraocular movements intact. V - Facial sensation intact bilaterally. VII - Facial movement intact bilaterally. VIII - Hearing & vestibular intact bilaterally. X - Palate elevates symmetrically. XI - Chin turning & shoulder shrug intact bilaterally. XII - Tongue protrusion intact.  Motor Strength - The patient's strength was normal in all extremities and pronator drift was absent.  Bulk was normal and fasciculations were absent.   Motor Tone - Muscle tone was assessed at the neck and appendages and was normal.  Reflexes - The patient's reflexes were 1+ in all extremities and she had no pathological reflexes.  Sensory - Light touch, temperature/pinprick were assessed and were normal.    Coordination - The patient had normal movements in the hands with no ataxia or dysmetria.  Tremor was absent.  Gait and Station - walk with walker, slow but steady, en bloc turns with difficulty standing up from chair.  Data reviewed: I personally reviewed the images and agree with the radiology interpretations.  Dg Chest 2 View  07/13/2014  No active cardiopulmonary disease.  Mr Brain Wo Contrast  07/13/2014  Acute RIGHT frontal subcortical white matter punctate infarct. It is unclear if this relates to the patient's RIGHT facial weakness. Advanced atrophy and small vessel disease.  Mr Jodene Nam Head/brain Wo Cm  07/13/2014  No proximal stenosis of the carotid, or basilar arteries. Mild to moderate intracranial atherosclerotic change as described.  2D Echocardiogram EF 65-70%.  Carotid Doppler No evidence of  hemodynamically significant internal carotid artery stenosis. Vertebral artery flow is antegrade.   Assessment: As you may recall, she is a 80 y.o. Caucasian female with PMH of Alzheimer's dementia on aricept, hypertension, hyperlipidemia, and previous TIA was admitted on 07/12/14 for right bell's palsy, but found to have right frontal small subcortical acute stroke on MRI. Considered as asymptomatic and incidental finding. She was discharged with prednisone and valtrex for bell's palsy and also switched from ASA to plavix for stroke prevention. During the interval time, she was doing well but for some reason, she was continued on ASA 325 along with plavix.   Plan:  - continue plavix and lipitor for stroke prevention - stop ASA 325mg  - check BP at ALF and monitor - follow up with PCP in about 2 weeks for stroke risk factor modification, especially BP control, and for urinary incontinence - RTC in 3 months.  No orders of the defined types were placed in this encounter.    Meds ordered this encounter  Medications  . aspirin 325 MG tablet    Sig: Take 325 mg by mouth daily.    Patient Instructions  - stop taking ASA and continue plavix for stroke prevention - continue lipitor for stork prevention - check BP at assitant living facility once a day and record - make an appointment with Dr. Etter Sjogren in about 2 weeks for stroke risk factor modification and for management of urinary incontinence. - follow up in 3 months.   Rosalin Hawking, MD PhD [facility] [address] [place], [place] [address] [phone]

## 2014-08-19 NOTE — Patient Instructions (Signed)
-   stop taking ASA and continue plavix for stroke prevention - continue lipitor for stork prevention - check BP at assitant living facility once a day and record - make an appointment with Dr. Etter Sjogren in about 2 weeks for stroke risk factor modification and for management of urinary incontinence. - follow up in 3 months.

## 2014-08-21 DIAGNOSIS — I69322 Dysarthria following cerebral infarction: Secondary | ICD-10-CM | POA: Diagnosis not present

## 2014-08-21 DIAGNOSIS — G510 Bell's palsy: Secondary | ICD-10-CM | POA: Diagnosis not present

## 2014-08-21 DIAGNOSIS — G309 Alzheimer's disease, unspecified: Secondary | ICD-10-CM | POA: Diagnosis not present

## 2014-08-21 DIAGNOSIS — F0280 Dementia in other diseases classified elsewhere without behavioral disturbance: Secondary | ICD-10-CM | POA: Diagnosis not present

## 2014-08-21 DIAGNOSIS — I10 Essential (primary) hypertension: Secondary | ICD-10-CM | POA: Diagnosis not present

## 2014-08-21 DIAGNOSIS — I69354 Hemiplegia and hemiparesis following cerebral infarction affecting left non-dominant side: Secondary | ICD-10-CM | POA: Diagnosis not present

## 2014-08-22 ENCOUNTER — Telehealth: Payer: Self-pay | Admitting: Family Medicine

## 2014-08-22 DIAGNOSIS — I69322 Dysarthria following cerebral infarction: Secondary | ICD-10-CM | POA: Diagnosis not present

## 2014-08-22 DIAGNOSIS — G309 Alzheimer's disease, unspecified: Secondary | ICD-10-CM | POA: Diagnosis not present

## 2014-08-22 DIAGNOSIS — F0390 Unspecified dementia without behavioral disturbance: Secondary | ICD-10-CM

## 2014-08-22 DIAGNOSIS — I69354 Hemiplegia and hemiparesis following cerebral infarction affecting left non-dominant side: Secondary | ICD-10-CM | POA: Diagnosis not present

## 2014-08-22 DIAGNOSIS — I639 Cerebral infarction, unspecified: Secondary | ICD-10-CM

## 2014-08-22 DIAGNOSIS — G510 Bell's palsy: Secondary | ICD-10-CM | POA: Diagnosis not present

## 2014-08-22 DIAGNOSIS — F0280 Dementia in other diseases classified elsewhere without behavioral disturbance: Secondary | ICD-10-CM | POA: Diagnosis not present

## 2014-08-22 DIAGNOSIS — I10 Essential (primary) hypertension: Secondary | ICD-10-CM | POA: Diagnosis not present

## 2014-08-22 NOTE — Telephone Encounter (Signed)
Please advise if this is ok to order.     KP

## 2014-08-22 NOTE — Telephone Encounter (Signed)
Caller name: Zigmund Daniel  Relation to pt: other / [facility] Call back number: [phone]   Reason for call:   Ordering requesting bedside commode  Attention Zigmund Daniel  Fax [phone]

## 2014-08-22 NOTE — Telephone Encounter (Signed)
Bedside commode for pt age seems reasonable. Go ahead and order and I can sign.

## 2014-08-23 DIAGNOSIS — I69322 Dysarthria following cerebral infarction: Secondary | ICD-10-CM | POA: Diagnosis not present

## 2014-08-23 DIAGNOSIS — I10 Essential (primary) hypertension: Secondary | ICD-10-CM | POA: Diagnosis not present

## 2014-08-23 DIAGNOSIS — G510 Bell's palsy: Secondary | ICD-10-CM | POA: Diagnosis not present

## 2014-08-23 DIAGNOSIS — G309 Alzheimer's disease, unspecified: Secondary | ICD-10-CM | POA: Diagnosis not present

## 2014-08-23 DIAGNOSIS — F0280 Dementia in other diseases classified elsewhere without behavioral disturbance: Secondary | ICD-10-CM | POA: Diagnosis not present

## 2014-08-23 DIAGNOSIS — I69354 Hemiplegia and hemiparesis following cerebral infarction affecting left non-dominant side: Secondary | ICD-10-CM | POA: Diagnosis not present

## 2014-08-23 NOTE — Telephone Encounter (Signed)
Order Faxed.   KP

## 2014-08-24 ENCOUNTER — Other Ambulatory Visit: Payer: Self-pay | Admitting: Adult Health

## 2014-08-26 ENCOUNTER — Telehealth: Payer: Self-pay | Admitting: Family Medicine

## 2014-08-26 DIAGNOSIS — G510 Bell's palsy: Secondary | ICD-10-CM | POA: Diagnosis not present

## 2014-08-26 DIAGNOSIS — I69354 Hemiplegia and hemiparesis following cerebral infarction affecting left non-dominant side: Secondary | ICD-10-CM | POA: Diagnosis not present

## 2014-08-26 DIAGNOSIS — F0280 Dementia in other diseases classified elsewhere without behavioral disturbance: Secondary | ICD-10-CM | POA: Diagnosis not present

## 2014-08-26 DIAGNOSIS — G309 Alzheimer's disease, unspecified: Secondary | ICD-10-CM | POA: Diagnosis not present

## 2014-08-26 DIAGNOSIS — I69322 Dysarthria following cerebral infarction: Secondary | ICD-10-CM | POA: Diagnosis not present

## 2014-08-26 DIAGNOSIS — I10 Essential (primary) hypertension: Secondary | ICD-10-CM | POA: Diagnosis not present

## 2014-08-26 NOTE — Telephone Encounter (Signed)
FYI  KP

## 2014-08-26 NOTE — Telephone Encounter (Signed)
Caller name: malerie Relation to pt: [facility] (occ therapist) Call back number:[phone]   Reason for call:  Pt missed OT visit for 10/4.  No call back needed

## 2014-08-29 ENCOUNTER — Other Ambulatory Visit: Payer: Self-pay

## 2014-08-29 DIAGNOSIS — G309 Alzheimer's disease, unspecified: Secondary | ICD-10-CM | POA: Diagnosis not present

## 2014-08-29 DIAGNOSIS — I69354 Hemiplegia and hemiparesis following cerebral infarction affecting left non-dominant side: Secondary | ICD-10-CM | POA: Diagnosis not present

## 2014-08-29 DIAGNOSIS — F0280 Dementia in other diseases classified elsewhere without behavioral disturbance: Secondary | ICD-10-CM | POA: Diagnosis not present

## 2014-08-29 DIAGNOSIS — I69322 Dysarthria following cerebral infarction: Secondary | ICD-10-CM | POA: Diagnosis not present

## 2014-08-29 DIAGNOSIS — G510 Bell's palsy: Secondary | ICD-10-CM | POA: Diagnosis not present

## 2014-08-29 DIAGNOSIS — I10 Essential (primary) hypertension: Secondary | ICD-10-CM | POA: Diagnosis not present

## 2014-08-29 MED ORDER — DONEPEZIL HCL 23 MG PO TABS: 23.0000 mg | ORAL_TABLET | Freq: Every day | ORAL | Status: DC

## 2014-09-02 DIAGNOSIS — I10 Essential (primary) hypertension: Secondary | ICD-10-CM | POA: Diagnosis not present

## 2014-09-02 DIAGNOSIS — G309 Alzheimer's disease, unspecified: Secondary | ICD-10-CM | POA: Diagnosis not present

## 2014-09-02 DIAGNOSIS — G510 Bell's palsy: Secondary | ICD-10-CM | POA: Diagnosis not present

## 2014-09-02 DIAGNOSIS — I69322 Dysarthria following cerebral infarction: Secondary | ICD-10-CM | POA: Diagnosis not present

## 2014-09-02 DIAGNOSIS — I69354 Hemiplegia and hemiparesis following cerebral infarction affecting left non-dominant side: Secondary | ICD-10-CM | POA: Diagnosis not present

## 2014-09-02 DIAGNOSIS — F0280 Dementia in other diseases classified elsewhere without behavioral disturbance: Secondary | ICD-10-CM | POA: Diagnosis not present

## 2014-09-03 ENCOUNTER — Telehealth: Payer: Self-pay

## 2014-09-03 NOTE — Telephone Encounter (Signed)
PCP has not prescribed Ultracet   in about a year, recommend to discuss with PCP as soon as she can

## 2014-09-03 NOTE — Telephone Encounter (Signed)
Dr.lowne last fille the Utlracet on 04/15/14

## 2014-09-03 NOTE — Telephone Encounter (Signed)
Ultracet refill request  Last seen and filled 04/15/14 #60 No UDS  Please advise    KP

## 2014-09-04 ENCOUNTER — Telehealth: Payer: Self-pay | Admitting: Family Medicine

## 2014-09-04 DIAGNOSIS — F0280 Dementia in other diseases classified elsewhere without behavioral disturbance: Secondary | ICD-10-CM | POA: Diagnosis not present

## 2014-09-04 DIAGNOSIS — I69322 Dysarthria following cerebral infarction: Secondary | ICD-10-CM | POA: Diagnosis not present

## 2014-09-04 DIAGNOSIS — I10 Essential (primary) hypertension: Secondary | ICD-10-CM | POA: Diagnosis not present

## 2014-09-04 DIAGNOSIS — G309 Alzheimer's disease, unspecified: Secondary | ICD-10-CM | POA: Diagnosis not present

## 2014-09-04 DIAGNOSIS — G510 Bell's palsy: Secondary | ICD-10-CM | POA: Diagnosis not present

## 2014-09-04 DIAGNOSIS — I69354 Hemiplegia and hemiparesis following cerebral infarction affecting left non-dominant side: Secondary | ICD-10-CM | POA: Diagnosis not present

## 2014-09-04 NOTE — Telephone Encounter (Signed)
Verbal order left on VM.     KP

## 2014-09-04 NOTE — Telephone Encounter (Signed)
Caller name: malorie from [facility] Relation to pt: Call back number: [phone] Pharmacy:  Reason for call:   malorie from [facility] states that patient missed visit Week of 9/27 and would like to make that visit up week of 10/25. She needs an order for this.

## 2014-09-07 ENCOUNTER — Other Ambulatory Visit: Payer: Self-pay | Admitting: Adult Health

## 2014-09-09 ENCOUNTER — Other Ambulatory Visit: Payer: Self-pay | Admitting: Family Medicine

## 2014-09-09 DIAGNOSIS — F0280 Dementia in other diseases classified elsewhere without behavioral disturbance: Secondary | ICD-10-CM | POA: Diagnosis not present

## 2014-09-09 DIAGNOSIS — I69322 Dysarthria following cerebral infarction: Secondary | ICD-10-CM | POA: Diagnosis not present

## 2014-09-09 DIAGNOSIS — G309 Alzheimer's disease, unspecified: Secondary | ICD-10-CM | POA: Diagnosis not present

## 2014-09-09 DIAGNOSIS — G510 Bell's palsy: Secondary | ICD-10-CM | POA: Diagnosis not present

## 2014-09-09 DIAGNOSIS — I69354 Hemiplegia and hemiparesis following cerebral infarction affecting left non-dominant side: Secondary | ICD-10-CM | POA: Diagnosis not present

## 2014-09-09 DIAGNOSIS — I10 Essential (primary) hypertension: Secondary | ICD-10-CM | POA: Diagnosis not present

## 2014-09-11 DIAGNOSIS — I10 Essential (primary) hypertension: Secondary | ICD-10-CM | POA: Diagnosis not present

## 2014-09-11 DIAGNOSIS — G309 Alzheimer's disease, unspecified: Secondary | ICD-10-CM | POA: Diagnosis not present

## 2014-09-11 DIAGNOSIS — G510 Bell's palsy: Secondary | ICD-10-CM | POA: Diagnosis not present

## 2014-09-11 DIAGNOSIS — I69322 Dysarthria following cerebral infarction: Secondary | ICD-10-CM | POA: Diagnosis not present

## 2014-09-11 DIAGNOSIS — I69354 Hemiplegia and hemiparesis following cerebral infarction affecting left non-dominant side: Secondary | ICD-10-CM | POA: Diagnosis not present

## 2014-09-11 DIAGNOSIS — F0280 Dementia in other diseases classified elsewhere without behavioral disturbance: Secondary | ICD-10-CM | POA: Diagnosis not present

## 2014-09-16 ENCOUNTER — Ambulatory Visit: Payer: Self-pay | Admitting: Nurse Practitioner

## 2014-09-16 NOTE — Telephone Encounter (Signed)
error 

## 2014-09-17 DIAGNOSIS — G510 Bell's palsy: Secondary | ICD-10-CM | POA: Diagnosis not present

## 2014-09-17 DIAGNOSIS — G309 Alzheimer's disease, unspecified: Secondary | ICD-10-CM | POA: Diagnosis not present

## 2014-09-17 DIAGNOSIS — I69322 Dysarthria following cerebral infarction: Secondary | ICD-10-CM | POA: Diagnosis not present

## 2014-09-17 DIAGNOSIS — I69354 Hemiplegia and hemiparesis following cerebral infarction affecting left non-dominant side: Secondary | ICD-10-CM | POA: Diagnosis not present

## 2014-09-17 DIAGNOSIS — F0280 Dementia in other diseases classified elsewhere without behavioral disturbance: Secondary | ICD-10-CM | POA: Diagnosis not present

## 2014-09-17 DIAGNOSIS — I10 Essential (primary) hypertension: Secondary | ICD-10-CM | POA: Diagnosis not present

## 2014-09-19 DIAGNOSIS — I69354 Hemiplegia and hemiparesis following cerebral infarction affecting left non-dominant side: Secondary | ICD-10-CM | POA: Diagnosis not present

## 2014-09-19 DIAGNOSIS — F0280 Dementia in other diseases classified elsewhere without behavioral disturbance: Secondary | ICD-10-CM | POA: Diagnosis not present

## 2014-09-19 DIAGNOSIS — G309 Alzheimer's disease, unspecified: Secondary | ICD-10-CM | POA: Diagnosis not present

## 2014-09-19 DIAGNOSIS — G510 Bell's palsy: Secondary | ICD-10-CM | POA: Diagnosis not present

## 2014-09-19 DIAGNOSIS — I69322 Dysarthria following cerebral infarction: Secondary | ICD-10-CM | POA: Diagnosis not present

## 2014-09-19 DIAGNOSIS — I10 Essential (primary) hypertension: Secondary | ICD-10-CM | POA: Diagnosis not present

## 2014-09-24 DIAGNOSIS — I69354 Hemiplegia and hemiparesis following cerebral infarction affecting left non-dominant side: Secondary | ICD-10-CM | POA: Diagnosis not present

## 2014-09-24 DIAGNOSIS — F0280 Dementia in other diseases classified elsewhere without behavioral disturbance: Secondary | ICD-10-CM | POA: Diagnosis not present

## 2014-09-24 DIAGNOSIS — G510 Bell's palsy: Secondary | ICD-10-CM | POA: Diagnosis not present

## 2014-09-24 DIAGNOSIS — G309 Alzheimer's disease, unspecified: Secondary | ICD-10-CM | POA: Diagnosis not present

## 2014-09-24 DIAGNOSIS — I69322 Dysarthria following cerebral infarction: Secondary | ICD-10-CM | POA: Diagnosis not present

## 2014-09-24 DIAGNOSIS — I10 Essential (primary) hypertension: Secondary | ICD-10-CM | POA: Diagnosis not present

## 2014-09-26 DIAGNOSIS — G309 Alzheimer's disease, unspecified: Secondary | ICD-10-CM | POA: Diagnosis not present

## 2014-09-26 DIAGNOSIS — I69354 Hemiplegia and hemiparesis following cerebral infarction affecting left non-dominant side: Secondary | ICD-10-CM | POA: Diagnosis not present

## 2014-09-26 DIAGNOSIS — I10 Essential (primary) hypertension: Secondary | ICD-10-CM | POA: Diagnosis not present

## 2014-09-26 DIAGNOSIS — I69322 Dysarthria following cerebral infarction: Secondary | ICD-10-CM | POA: Diagnosis not present

## 2014-09-26 DIAGNOSIS — G510 Bell's palsy: Secondary | ICD-10-CM | POA: Diagnosis not present

## 2014-09-26 DIAGNOSIS — F0280 Dementia in other diseases classified elsewhere without behavioral disturbance: Secondary | ICD-10-CM | POA: Diagnosis not present

## 2014-10-01 DIAGNOSIS — I69354 Hemiplegia and hemiparesis following cerebral infarction affecting left non-dominant side: Secondary | ICD-10-CM | POA: Diagnosis not present

## 2014-10-01 DIAGNOSIS — G510 Bell's palsy: Secondary | ICD-10-CM | POA: Diagnosis not present

## 2014-10-01 DIAGNOSIS — F0280 Dementia in other diseases classified elsewhere without behavioral disturbance: Secondary | ICD-10-CM | POA: Diagnosis not present

## 2014-10-01 DIAGNOSIS — G309 Alzheimer's disease, unspecified: Secondary | ICD-10-CM | POA: Diagnosis not present

## 2014-10-01 DIAGNOSIS — I10 Essential (primary) hypertension: Secondary | ICD-10-CM | POA: Diagnosis not present

## 2014-10-01 DIAGNOSIS — I69322 Dysarthria following cerebral infarction: Secondary | ICD-10-CM | POA: Diagnosis not present

## 2014-10-03 DIAGNOSIS — F0280 Dementia in other diseases classified elsewhere without behavioral disturbance: Secondary | ICD-10-CM | POA: Diagnosis not present

## 2014-10-03 DIAGNOSIS — I69354 Hemiplegia and hemiparesis following cerebral infarction affecting left non-dominant side: Secondary | ICD-10-CM | POA: Diagnosis not present

## 2014-10-03 DIAGNOSIS — I10 Essential (primary) hypertension: Secondary | ICD-10-CM | POA: Diagnosis not present

## 2014-10-03 DIAGNOSIS — G510 Bell's palsy: Secondary | ICD-10-CM | POA: Diagnosis not present

## 2014-10-03 DIAGNOSIS — I69322 Dysarthria following cerebral infarction: Secondary | ICD-10-CM | POA: Diagnosis not present

## 2014-10-03 DIAGNOSIS — G309 Alzheimer's disease, unspecified: Secondary | ICD-10-CM | POA: Diagnosis not present

## 2014-10-06 ENCOUNTER — Other Ambulatory Visit: Payer: Self-pay | Admitting: Adult Health

## 2014-10-07 ENCOUNTER — Other Ambulatory Visit: Payer: Self-pay | Admitting: Family Medicine

## 2014-10-07 NOTE — Telephone Encounter (Signed)
Last seen 04/15/14 and filled 08/16/13 #60.   Please advise   KP

## 2014-11-01 ENCOUNTER — Other Ambulatory Visit: Payer: Self-pay | Admitting: Family Medicine

## 2014-11-04 NOTE — Telephone Encounter (Signed)
Last filled:  10/07/14 Amt: 30, 0 refills Last seen: 04/15/14  Please advise.

## 2014-11-27 ENCOUNTER — Ambulatory Visit: Payer: Medicare Other | Admitting: Neurology

## 2014-12-02 ENCOUNTER — Other Ambulatory Visit: Payer: Self-pay | Admitting: Family Medicine

## 2014-12-02 NOTE — Telephone Encounter (Signed)
Last seen 04/15/14 and filled 12/211/5 #30 No UDS  Please advise     KP

## 2014-12-03 MED ORDER — TRAMADOL-ACETAMINOPHEN 37.5-325 MG PO TABS: 1.0000 | ORAL_TABLET | Freq: Four times a day (QID) | ORAL | Status: DC | PRN

## 2014-12-03 NOTE — Telephone Encounter (Signed)
Annie Main will call back to schedule an apt to bring her in for her 6 mo follow up for this week, he is checking the schedule and will call back, will it be ok to fax and have her sign and get the UDS when she comes this week. Please advise    KP

## 2014-12-03 NOTE — Addendum Note (Signed)
Addended by: Ewing Schlein on: 12/03/2014 08:54 AM   Modules accepted: Orders

## 2014-12-03 NOTE — Telephone Encounter (Signed)
Rosalita Chessman, DO  Ewing Schlein, CMA           Can refill ultracet x1 but need UDS and contract

## 2015-01-23 ENCOUNTER — Other Ambulatory Visit: Payer: Self-pay | Admitting: Family Medicine

## 2015-01-23 NOTE — Telephone Encounter (Signed)
Requesting Ultracet 37.5-325mg -Take 1 tablet by mouth every 6 hours as needed. Last refill:12-03-14;#30,0 Last OV:04-15-14 UDS:No done Please advise.//AB/CMA

## 2015-02-24 ENCOUNTER — Telehealth: Payer: Self-pay

## 2015-02-24 NOTE — Telephone Encounter (Signed)
Requesting Ultracet 37.5-325mg -Take 1 tablet by mouth every 6 hours as needed. Last refill: 01-23-15;#30,0 Last OV:04-15-14 UDS:Not done   Please advise. KP

## 2015-02-24 NOTE — Telephone Encounter (Signed)
Refill x1   1 refill 

## 2015-02-25 MED ORDER — TRAMADOL-ACETAMINOPHEN 37.5-325 MG PO TABS: 1.0000 | ORAL_TABLET | Freq: Four times a day (QID) | ORAL | Status: DC | PRN

## 2015-02-25 NOTE — Telephone Encounter (Signed)
Rx faxed.    KP 

## 2015-02-26 ENCOUNTER — Encounter (HOSPITAL_COMMUNITY): Payer: Self-pay | Admitting: *Deleted

## 2015-02-26 ENCOUNTER — Emergency Department (HOSPITAL_COMMUNITY): Payer: Medicare Other

## 2015-02-26 ENCOUNTER — Emergency Department (HOSPITAL_COMMUNITY)
Admission: EM | Admit: 2015-02-26 | Discharge: 2015-02-26 | Disposition: A | Discharge status: Home / Self Care | Payer: Medicare Other | Attending: Emergency Medicine | Admitting: Emergency Medicine

## 2015-02-26 DIAGNOSIS — F0390 Unspecified dementia without behavioral disturbance: Secondary | ICD-10-CM | POA: Diagnosis not present

## 2015-02-26 DIAGNOSIS — Z8673 Personal history of transient ischemic attack (TIA), and cerebral infarction without residual deficits: Secondary | ICD-10-CM | POA: Insufficient documentation

## 2015-02-26 DIAGNOSIS — M549 Dorsalgia, unspecified: Secondary | ICD-10-CM

## 2015-02-26 DIAGNOSIS — Z7902 Long term (current) use of antithrombotics/antiplatelets: Secondary | ICD-10-CM | POA: Diagnosis not present

## 2015-02-26 DIAGNOSIS — Z8541 Personal history of malignant neoplasm of cervix uteri: Secondary | ICD-10-CM | POA: Insufficient documentation

## 2015-02-26 DIAGNOSIS — S0990XA Unspecified injury of head, initial encounter: Secondary | ICD-10-CM | POA: Insufficient documentation

## 2015-02-26 DIAGNOSIS — E785 Hyperlipidemia, unspecified: Secondary | ICD-10-CM | POA: Insufficient documentation

## 2015-02-26 DIAGNOSIS — I10 Essential (primary) hypertension: Secondary | ICD-10-CM | POA: Insufficient documentation

## 2015-02-26 DIAGNOSIS — Z79899 Other long term (current) drug therapy: Secondary | ICD-10-CM | POA: Insufficient documentation

## 2015-02-26 DIAGNOSIS — Z7982 Long term (current) use of aspirin: Secondary | ICD-10-CM | POA: Diagnosis not present

## 2015-02-26 DIAGNOSIS — S199XXA Unspecified injury of neck, initial encounter: Secondary | ICD-10-CM | POA: Insufficient documentation

## 2015-02-26 DIAGNOSIS — S299XXA Unspecified injury of thorax, initial encounter: Secondary | ICD-10-CM | POA: Diagnosis not present

## 2015-02-26 DIAGNOSIS — Y9389 Activity, other specified: Secondary | ICD-10-CM | POA: Diagnosis not present

## 2015-02-26 DIAGNOSIS — S24109A Unspecified injury at unspecified level of thoracic spinal cord, initial encounter: Secondary | ICD-10-CM | POA: Insufficient documentation

## 2015-02-26 DIAGNOSIS — Y998 Other external cause status: Secondary | ICD-10-CM | POA: Diagnosis not present

## 2015-02-26 DIAGNOSIS — S3992XA Unspecified injury of lower back, initial encounter: Secondary | ICD-10-CM | POA: Diagnosis not present

## 2015-02-26 DIAGNOSIS — Z8669 Personal history of other diseases of the nervous system and sense organs: Secondary | ICD-10-CM | POA: Diagnosis not present

## 2015-02-26 DIAGNOSIS — M1990 Unspecified osteoarthritis, unspecified site: Secondary | ICD-10-CM | POA: Diagnosis not present

## 2015-02-26 DIAGNOSIS — W1839XA Other fall on same level, initial encounter: Secondary | ICD-10-CM | POA: Diagnosis not present

## 2015-02-26 DIAGNOSIS — M545 Low back pain: Secondary | ICD-10-CM | POA: Diagnosis not present

## 2015-02-26 DIAGNOSIS — Y92122 Bedroom in nursing home as the place of occurrence of the external cause: Secondary | ICD-10-CM | POA: Diagnosis not present

## 2015-02-26 DIAGNOSIS — M5489 Other dorsalgia: Secondary | ICD-10-CM | POA: Diagnosis not present

## 2015-02-26 DIAGNOSIS — T148 Other injury of unspecified body region: Secondary | ICD-10-CM | POA: Diagnosis not present

## 2015-02-26 DIAGNOSIS — W19XXXA Unspecified fall, initial encounter: Secondary | ICD-10-CM

## 2015-02-26 LAB — COMPREHENSIVE METABOLIC PANEL
ALT: 13 U/L (ref 0–35)
AST: 21 U/L (ref 0–37)
Albumin: 4.3 g/dL (ref 3.5–5.2)
Alkaline Phosphatase: 65 U/L (ref 39–117)
Anion gap: 11 (ref 5–15)
BUN: 16 mg/dL (ref 6–23)
CALCIUM: 9.5 mg/dL (ref 8.4–10.5)
CO2: 26 mmol/L (ref 19–32)
Chloride: 104 mmol/L (ref 96–112)
Creatinine, Ser: 0.98 mg/dL (ref 0.50–1.10)
GFR, EST AFRICAN AMERICAN: 60 mL/min — AB (ref >90)
GFR, EST NON AFRICAN AMERICAN: 52 mL/min — AB (ref >90)
Glucose, Bld: 101 mg/dL — ABNORMAL HIGH (ref 70–99)
POTASSIUM: 4 mmol/L (ref 3.5–5.1)
SODIUM: 141 mmol/L (ref 135–145)
Total Bilirubin: 1.3 mg/dL — ABNORMAL HIGH (ref 0.3–1.2)
Total Protein: 6.9 g/dL (ref 6.0–8.3)

## 2015-02-26 LAB — URINALYSIS, ROUTINE W REFLEX MICROSCOPIC
Glucose, UA: NEGATIVE mg/dL
HGB URINE DIPSTICK: NEGATIVE
Ketones, ur: 15 mg/dL — AB
Leukocytes, UA: NEGATIVE
Nitrite: NEGATIVE
PH: 5.5 (ref 5.0–8.0)
Protein, ur: NEGATIVE mg/dL
SPECIFIC GRAVITY, URINE: 1.025 (ref 1.005–1.030)
Urobilinogen, UA: 1 mg/dL (ref 0.0–1.0)

## 2015-02-26 LAB — CBC WITH DIFFERENTIAL/PLATELET
Basophils Absolute: 0 10*3/uL (ref 0.0–0.1)
Basophils Relative: 0 % (ref 0–1)
EOS PCT: 1 % (ref 0–5)
Eosinophils Absolute: 0.1 10*3/uL (ref 0.0–0.7)
HCT: 46.9 % — ABNORMAL HIGH (ref 36.0–46.0)
Hemoglobin: 15.6 g/dL — ABNORMAL HIGH (ref 12.0–15.0)
Lymphocytes Relative: 26 % (ref 12–46)
Lymphs Abs: 2.4 10*3/uL (ref 0.7–4.0)
MCH: 31.3 pg (ref 26.0–34.0)
MCHC: 33.3 g/dL (ref 30.0–36.0)
MCV: 94.2 fL (ref 78.0–100.0)
Monocytes Absolute: 1 10*3/uL (ref 0.1–1.0)
Monocytes Relative: 11 % (ref 3–12)
NEUTROS ABS: 5.9 10*3/uL (ref 1.7–7.7)
Neutrophils Relative %: 62 % (ref 43–77)
PLATELETS: 212 10*3/uL (ref 150–400)
RBC: 4.98 MIL/uL (ref 3.87–5.11)
RDW: 13.3 % (ref 11.5–15.5)
WBC: 9.5 10*3/uL (ref 4.0–10.5)

## 2015-02-26 NOTE — ED Notes (Signed)
Patient is in xray.  I will collect her labs when she returns.

## 2015-02-26 NOTE — Progress Notes (Signed)
CSW met with pt at bedside. Son was present.Patient appears to be hard of hearing. Patient and son confirm that she is from [facility]. Son states that the pt has been living there for 3 years and receives assistance with completing her ADL's.   Patient confirms that she presents to [facility] due to falling, but is not sure what caused her fall. Per note, pt stated she fell while trying to get into bed.  Son informed CSW that the pt does not fall often and has not fallen within the past 6 months besides this incident. Son states that the pt has a care giver who visits her 7 days a week x4 a day. He says that the care giver is through an agency called "[facility]". Son informed CSW that the is the pt's primary support and visits her at the facility at least x3 a week.  Son states that he does not have any questions.  Son/Stephen Billig [phone]  Willette Brace [phone] ED CSW 02/26/2015 8:43 PM

## 2015-02-26 NOTE — ED Notes (Signed)
Patient is still in xray

## 2015-02-26 NOTE — ED Notes (Signed)
Bed: JJ00 Expected date:  Expected time:  Means of arrival:  Comments: FALL

## 2015-02-26 NOTE — ED Provider Notes (Signed)
CSN: [id number]     Arrival date & time 02/26/15  1808 History   First MD Initiated Contact with Patient 02/26/15 1843     Chief Complaint  Patient presents with  . Fall  . Pain all over      (Consider location/radiation/quality/duration/timing/severity/associated sxs/prior Treatment) HPI Comments: Kimberly Craig is a 81 y.o. female with a PMHx of dementia, TIA, HLD, HTN, and arthritis, who presents to the ED via EMS from [facility] for fall. LEVEL 5 CAVEAT: pt has dementia and is a poor historian. EMS reports stat that she "fell trying to get in bed" earlier today. Pt states the fall was last night, states that she fell next to her bed from standing position while attempting to try to get into bed at around 9 PM. She states that her back hurts "in the middle", stating it is a 7/10 constant dull nonradiating pain, worse with movement and ambulating, with no medications tried prior to arrival. Nursing note states that she had no loss of consciousness, but when asked the patient states she thinks she had loss of consciousness but she is not sure for how long. She endorses a headache but cannot describe it further. She denies any neck pain, vision changes, stomach pain, wounds, abrasions, pelvic pain, numbness, tingling, weakness, bruising, chest pain, shortness of breath, cough, recent fevers or chills, abdominal pain, nausea, vomiting, diarrhea, dysuria, hematuria, vaginal bleeding or discharge, or taking blood thinners. She thinks the year is 51 and that the president is [place].  Patient is a 80 y.o. female presenting with fall. The history is provided by the patient. No language interpreter was used.  Fall This is a new problem. The current episode started today. The problem occurs intermittently. The problem has been unchanged. Associated symptoms include headaches. Pertinent negatives include no abdominal pain, arthralgias, chest pain, chills, fever, joint swelling, myalgias, nausea,  neck pain, numbness, vertigo, visual change, vomiting or weakness. Nothing aggravates the symptoms. She has tried nothing for the symptoms. The treatment provided no relief.    Past Medical History  Diagnosis Date  . Arthritis   . Hypertension   . Hyperlipidemia   . Insomnia   . TIA (transient ischemic attack)   . Uterine cancer   . Dementia    Past Surgical History  Procedure Laterality Date  . No surgical history  03/2012   Family History  Problem Relation Age of Onset  . Stroke Father    History  Substance Use Topics  . Smoking status: Never Smoker   . Smokeless tobacco: Never Used  . Alcohol Use: No   OB History    No data available     Review of Systems  Unable to perform ROS: Dementia  Constitutional: Negative for fever and chills.  Eyes: Negative for visual disturbance.  Respiratory: Negative for shortness of breath.   Cardiovascular: Negative for chest pain.  Gastrointestinal: Negative for nausea, vomiting, abdominal pain and diarrhea.  Genitourinary: Negative for dysuria and hematuria.  Musculoskeletal: Positive for back pain. Negative for myalgias, joint swelling, arthralgias and neck pain.  Skin: Negative for wound.  Neurological: Positive for headaches. Negative for vertigo, weakness and numbness.    Allergies  Review of patient's allergies indicates no known allergies.  Home Medications   Prior to Admission medications   Medication Sig Start Date End Date Taking? Authorizing Provider  aspirin 325 MG tablet Take 325 mg by mouth daily.   Yes Historical Provider, MD  atorvastatin (LIPITOR) 10 MG tablet  Take 10 mg by mouth daily.   Yes Historical Provider, MD  calcium gluconate 500 MG tablet Take 500 mg by mouth daily.   Yes Historical Provider, MD  clopidogrel (PLAVIX) 75 MG tablet TAKE 1 TABLET BY MOUTH EVERY DAY 09/09/14  Yes Yvonne R Lowne, DO  donepezil (ARICEPT) 23 MG TABS tablet Take 1 tablet (23 mg total) by mouth at bedtime. 08/29/14  Yes Yvonne  R Lowne, DO  lisinopril (PRINIVIL,ZESTRIL) 20 MG tablet Take 1 tablet (20 mg total) by mouth daily. 07/16/14  Yes Eugenie Filler, MD  traMADol-acetaminophen (ULTRACET) 37.5-325 MG per tablet Take 1 tablet by mouth every 6 (six) hours as needed. Patient taking differently: Take 1 tablet by mouth every 6 (six) hours as needed for moderate pain.  02/25/15  Yes Yvonne R Lowne, DO  valACYclovir (VALTREX) 1000 MG tablet Take 1 tablet (1,000 mg total) by mouth 3 (three) times daily. Take for 6 days then stop. 07/16/14   Eugenie Filler, MD   BP 187/67 mmHg  Pulse 61  Temp(Src) 98.3 F (36.8 C) (Oral)  Resp 18  SpO2 100% Physical Exam  Constitutional: Vital signs are normal. She appears well-developed and well-nourished.  Non-toxic appearance. No distress. Cervical collar and backboard in place.  Afebrile, nontoxic, NAD, on back board and C-collar applied  HENT:  Head: Normocephalic and atraumatic. Head is without raccoon's eyes, without Battle's sign and without contusion.  Mouth/Throat: Oropharynx is clear and moist and mucous membranes are normal.  [place]/AT, no scalp tenderness or abrasions/deformities, no crepitus.  Eyes: Conjunctivae and EOM are normal. Pupils are equal, round, and reactive to light. Right eye exhibits no discharge. Left eye exhibits no discharge.  PERRL, EOMI, no nystagmus, no visual field deficits   Neck: Spinous process tenderness present.    C-collar in place, unable to fully examine neck initially, tenderness over C7/T1 spinous processes without step offs or deformities.   Cardiovascular: Normal rate, regular rhythm, normal heart sounds and intact distal pulses.  Exam reveals no gallop and no friction rub.   No murmur heard. RRR, nl s1/s2, no m/r/g, distal pulses intact, no pedal edema   Pulmonary/Chest: Effort normal and breath sounds normal. No respiratory distress. She has no decreased breath sounds. She has no wheezes. She has no rhonchi. She has no rales.  Abdominal:  Soft. Normal appearance and bowel sounds are normal. She exhibits no distension. There is no tenderness. There is no rigidity, no rebound and no guarding.  Musculoskeletal: Normal range of motion.       Right shoulder: Normal.       Left shoulder: Normal.       Right hip: Normal.       Left hip: Normal.       Right ankle: Normal.       Cervical back: She exhibits tenderness.       Thoracic back: She exhibits tenderness.       Back:  C-spine as above Backboard in place which limits full examination of lumbar back, but some areas of bony TTP Thoracic spine with mild spinous process TTP diffusely, no bony stepoffs or deformities, no paraspinous muscle TTP or muscle spasms. Strength 5/5 in all extremities, sensation grossly intact in all extremities. No overlying skin changes.  No pelvic TTP. No tenderness in extremities, no deformities or swelling. R ankle with small skin tear laterally, no TTP, no effusion or swelling  Neurological: She is alert. She has normal strength. No sensory deficit. Coordination normal.  CN 2-12 grossly intact A&O x2, to place and person but not time (states it's 1978) Sensation and strength intact Coordination WNL  Skin: Skin is warm and dry. No rash noted.  Small skin tear to R lateral ankle, no bleeding  Psychiatric: She has a normal mood and affect.  Nursing note and vitals reviewed.   ED Course  Procedures (including critical care time) Labs Review Labs Reviewed  CBC WITH DIFFERENTIAL/PLATELET - Abnormal; Notable for the following:    Hemoglobin 15.6 (*)    HCT 46.9 (*)    All other components within normal limits  COMPREHENSIVE METABOLIC PANEL - Abnormal; Notable for the following:    Glucose, Bld 101 (*)    Total Bilirubin 1.3 (*)    GFR calc non Af Amer 52 (*)    GFR calc Af Amer 60 (*)    All other components within normal limits  URINALYSIS, ROUTINE W REFLEX MICROSCOPIC  I-STAT TROPOININ, ED    Imaging Review Dg Thoracic Spine 2  View  02/26/2015   CLINICAL DATA:  81 year old female post fall getting out of bed. Mid and lower back pain.  EXAM: THORACIC SPINE - 2 VIEW  COMPARISON:  None.  FINDINGS: Diffuse bony under mineralization. The alignment is maintained. Vertebral body heights are maintained. Normal for age disc space narrowing, Schmorl's node superior endplate of M19. Posterior elements appear intact. There is no paravertebral soft tissue abnormality.  IMPRESSION: No radiographic findings of fracture or subluxation of the thoracic spine.   Electronically Signed   By: Jeb Levering M.D.   On: 02/26/2015 19:54   Dg Lumbar Spine Complete  02/26/2015   CLINICAL DATA:  Fall getting in to bed earlier this day, now with low back pain.  EXAM: LUMBAR SPINE - COMPLETE 4+ VIEW  COMPARISON:  None.  FINDINGS: Diffuse bony under mineralization. The alignment is maintained. Vertebral body heights are normal. There is no listhesis. The posterior elements are intact. There is disc space narrowing and endplate spurring at Q2-I2. No fracture. Sacroiliac joints are congruent.  IMPRESSION: No fracture or subluxation of the lumbar spine.   Electronically Signed   By: Jeb Levering M.D.   On: 02/26/2015 19:56   Ct Head Wo Contrast  02/26/2015   CLINICAL DATA:  [place] tonight.  Hit head.  EXAM: CT HEAD WITHOUT CONTRAST  CT CERVICAL SPINE WITHOUT CONTRAST  TECHNIQUE: Multidetector CT imaging of the head and cervical spine was performed following the standard protocol without intravenous contrast. Multiplanar CT image reconstructions of the cervical spine were also generated.  COMPARISON:  Head CT 07/12/2014  FINDINGS: CT HEAD FINDINGS  Stable age related cerebral atrophy, ventriculomegaly and periventricular white matter disease. No extra-axial fluid collections are identified. No CT findings for acute hemispheric infarction or intracranial hemorrhage. No mass lesions. The brainstem and cerebellum are normal.  No acute skull fracture. The paranasal  sinuses and mastoid air cells are clear. The globes are intact.  CT CERVICAL SPINE FINDINGS  Degenerative cervical spondylosis with disc disease mainly at C5-6 and C6-7 and fairly significant multilevel facet disease. No acute fracture is identified. No abnormal prevertebral soft tissue swelling. The skullbase C1 and C1-2 articulations are maintained.  IMPRESSION: Stable age related cerebral atrophy, ventriculomegaly and periventricular white matter disease. No acute intracranial findings or acute skull fracture.  Degenerative cervical spondylosis with multilevel disc disease and facet disease but no definite acute cervical spine fracture.   Electronically Signed   By: Marijo Sanes M.D.   On:  02/26/2015 19:58   Ct Cervical Spine Wo Contrast  02/26/2015   CLINICAL DATA:  [place] tonight.  Hit head.  EXAM: CT HEAD WITHOUT CONTRAST  CT CERVICAL SPINE WITHOUT CONTRAST  TECHNIQUE: Multidetector CT imaging of the head and cervical spine was performed following the standard protocol without intravenous contrast. Multiplanar CT image reconstructions of the cervical spine were also generated.  COMPARISON:  Head CT 07/12/2014  FINDINGS: CT HEAD FINDINGS  Stable age related cerebral atrophy, ventriculomegaly and periventricular white matter disease. No extra-axial fluid collections are identified. No CT findings for acute hemispheric infarction or intracranial hemorrhage. No mass lesions. The brainstem and cerebellum are normal.  No acute skull fracture. The paranasal sinuses and mastoid air cells are clear. The globes are intact.  CT CERVICAL SPINE FINDINGS  Degenerative cervical spondylosis with disc disease mainly at C5-6 and C6-7 and fairly significant multilevel facet disease. No acute fracture is identified. No abnormal prevertebral soft tissue swelling. The skullbase C1 and C1-2 articulations are maintained.  IMPRESSION: Stable age related cerebral atrophy, ventriculomegaly and periventricular white matter disease. No  acute intracranial findings or acute skull fracture.  Degenerative cervical spondylosis with multilevel disc disease and facet disease but no definite acute cervical spine fracture.   Electronically Signed   By: Marijo Sanes M.D.   On: 02/26/2015 19:58     EKG Interpretation None      MDM   Final diagnoses:  Fall  Back pain, unspecified location    81 y.o. female here with fall, history limited due to dementia. She states fall was last night, EMS reporting fall was today. Endorses entire back pain and HA. States she had LOC but pt not reliable historian. Takes ASA 325mg . Will obtain CT head/neck, and back imaging. No other tenderness in other extremities, all extremities neurovascularly intact with soft compartments. Has small skin tear to R lateral ankle, no deformity or tenderness to leg. Will reassess shortly. Declines wanting meds.  9:56 PM CBC w/diff unremarkable, CMP unremarkable. All imaging without acute changes. Multiple attempts at labs were unsuccessful but after seeing pt with Dr. Vanita Panda he agrees that pt can go home without remaining labs being drawn. Will have her f/up with her PCP in 3 days. Stable for discharge.   BP 181/72 mmHg  Pulse 95  Temp(Src) 98.3 F (36.8 C) (Oral)  Resp 18  SpO2 98%   Nadiah Corbit Camprubi-Soms, PA-C 02/26/15 2157  Carmin Muskrat, MD 02/27/15 2348

## 2015-02-26 NOTE — ED Notes (Signed)
Pt is unable to give a urine specimen at this time. 

## 2015-02-26 NOTE — ED Notes (Addendum)
Per EMS, pt is from [facility]. Pt states she fell trying to get into bed. Pt denies head injury or loss of consciousness. Pt had small abrasion to right lateral ankle. Pt complains of mid back pain when doing SCCA exam. Pt states she has 7/10 pain "all over".

## 2015-02-26 NOTE — ED Notes (Signed)
Three unsuccessful attempts made to collect blood specimen from patient.  She tenses up and yells out.  Son at bedside states, "That's the last try."  [place], [place], made aware.

## 2015-02-26 NOTE — Discharge Instructions (Signed)
Use tylenol or motrin for pain. Use ice over painful areas, then move to heat. Follow up with your regular doctor in 3 days for recheck of your pain. Return to the ER for changes or worsening symptoms.   Fall Prevention and Home Safety Falls cause injuries and can affect all age groups. It is possible to prevent falls.  HOW TO PREVENT FALLS  Wear shoes with rubber soles that do not have an opening for your toes.  Keep the inside and outside of your house well lit.  Use night lights throughout your home.  Remove clutter from floors.  Clean up floor spills.  Remove throw rugs or fasten them to the floor with carpet tape.  Do not place electrical cords across pathways.  Put grab bars by your tub, shower, and toilet. Do not use towel bars as grab bars.  Put handrails on both sides of the stairway. Fix loose handrails.  Do not climb on stools or stepladders, if possible.  Do not wax your floors.  Repair uneven or unsafe sidewalks, walkways, or stairs.  Keep items you use a lot within reach.  Be aware of pets.  Keep emergency numbers next to the telephone.  Put smoke detectors in your home and near bedrooms. Ask your doctor what other things you can do to prevent falls. Document Released: 08/28/2009 Document Revised: 05/02/2012 Document Reviewed: 02/01/2012 [facility] Patient Information 2015 [place], [place]. This information is not intended to replace advice given to you by your health care provider. Make sure you discuss any questions you have with your health care provider.  Musculoskeletal Pain Musculoskeletal pain is muscle and boney aches and pains. These pains can occur in any part of the body. Your caregiver may treat you without knowing the cause of the pain. They may treat you if blood or urine tests, X-rays, and other tests were normal.  CAUSES There is often not a definite cause or reason for these pains. These pains may be caused by a type of germ (virus). The  discomfort may also come from overuse. Overuse includes working out too hard when your body is not fit. Boney aches also come from weather changes. Bone is sensitive to atmospheric pressure changes. HOME CARE INSTRUCTIONS   Ask when your test results will be ready. Make sure you get your test results.  Only take over-the-counter or prescription medicines for pain, discomfort, or fever as directed by your caregiver. If you were given medications for your condition, do not drive, operate machinery or power tools, or sign legal documents for 24 hours. Do not drink alcohol. Do not take sleeping pills or other medications that may interfere with treatment.  Continue all activities unless the activities cause more pain. When the pain lessens, slowly resume normal activities. Gradually increase the intensity and duration of the activities or exercise.  During periods of severe pain, bed rest may be helpful. Lay or sit in any position that is comfortable.  Putting ice on the injured area.  Put ice in a bag.  Place a towel between your skin and the bag.  Leave the ice on for 15 to 20 minutes, 3 to 4 times a day.  Follow up with your caregiver for continued problems and no reason can be found for the pain. If the pain becomes worse or does not go away, it may be necessary to repeat tests or do additional testing. Your caregiver may need to look further for a possible cause. SEEK IMMEDIATE MEDICAL CARE IF:  You have pain that is getting worse and is not relieved by medications.  You develop chest pain that is associated with shortness or breath, sweating, feeling sick to your stomach (nauseous), or throw up (vomit).  Your pain becomes localized to the abdomen.  You develop any new symptoms that seem different or that concern you. MAKE SURE YOU:   Understand these instructions.  Will watch your condition.  Will get help right away if you are not doing well or get worse. Document Released:  11/01/2005 Document Revised: 01/24/2012 Document Reviewed: 07/06/2013 [facility] Patient Information 2015 [place], [place]. This information is not intended to replace advice given to you by your health care provider. Make sure you discuss any questions you have with your health care provider.

## 2015-02-27 ENCOUNTER — Other Ambulatory Visit: Payer: Self-pay | Admitting: Family Medicine

## 2015-03-17 ENCOUNTER — Telehealth: Payer: Self-pay | Admitting: Family Medicine

## 2015-03-17 NOTE — Telephone Encounter (Signed)
Kimberly Craig has been given the verbal.      KP

## 2015-03-17 NOTE — Telephone Encounter (Signed)
Last seen 04/15/14. Please advise      KP

## 2015-03-17 NOTE — Telephone Encounter (Signed)
Ok to give verbal-- pt will need to be seen in June or around there

## 2015-03-17 NOTE — Telephone Encounter (Signed)
Caller name: York Spaniel from [employer] [facility] [employer]  Relation to pt: Corporate investment banker  Call back number: [phone]    Reason for call:  Requesting verbal orders for PT and OT

## 2015-03-18 DIAGNOSIS — M6281 Muscle weakness (generalized): Secondary | ICD-10-CM | POA: Diagnosis not present

## 2015-03-18 DIAGNOSIS — R2689 Other abnormalities of gait and mobility: Secondary | ICD-10-CM | POA: Diagnosis not present

## 2015-03-18 DIAGNOSIS — R278 Other lack of coordination: Secondary | ICD-10-CM | POA: Diagnosis not present

## 2015-03-18 DIAGNOSIS — R488 Other symbolic dysfunctions: Secondary | ICD-10-CM | POA: Diagnosis not present

## 2015-03-19 DIAGNOSIS — R2689 Other abnormalities of gait and mobility: Secondary | ICD-10-CM | POA: Diagnosis not present

## 2015-03-19 DIAGNOSIS — R278 Other lack of coordination: Secondary | ICD-10-CM | POA: Diagnosis not present

## 2015-03-19 DIAGNOSIS — M6281 Muscle weakness (generalized): Secondary | ICD-10-CM | POA: Diagnosis not present

## 2015-03-19 DIAGNOSIS — R488 Other symbolic dysfunctions: Secondary | ICD-10-CM | POA: Diagnosis not present

## 2015-03-20 DIAGNOSIS — R488 Other symbolic dysfunctions: Secondary | ICD-10-CM | POA: Diagnosis not present

## 2015-03-20 DIAGNOSIS — M6281 Muscle weakness (generalized): Secondary | ICD-10-CM | POA: Diagnosis not present

## 2015-03-20 DIAGNOSIS — R278 Other lack of coordination: Secondary | ICD-10-CM | POA: Diagnosis not present

## 2015-03-20 DIAGNOSIS — R2689 Other abnormalities of gait and mobility: Secondary | ICD-10-CM | POA: Diagnosis not present

## 2015-03-21 DIAGNOSIS — R2689 Other abnormalities of gait and mobility: Secondary | ICD-10-CM | POA: Diagnosis not present

## 2015-03-21 DIAGNOSIS — M6281 Muscle weakness (generalized): Secondary | ICD-10-CM | POA: Diagnosis not present

## 2015-03-21 DIAGNOSIS — R278 Other lack of coordination: Secondary | ICD-10-CM | POA: Diagnosis not present

## 2015-03-21 DIAGNOSIS — R488 Other symbolic dysfunctions: Secondary | ICD-10-CM | POA: Diagnosis not present

## 2015-03-24 DIAGNOSIS — R488 Other symbolic dysfunctions: Secondary | ICD-10-CM | POA: Diagnosis not present

## 2015-03-24 DIAGNOSIS — R2689 Other abnormalities of gait and mobility: Secondary | ICD-10-CM | POA: Diagnosis not present

## 2015-03-24 DIAGNOSIS — M6281 Muscle weakness (generalized): Secondary | ICD-10-CM | POA: Diagnosis not present

## 2015-03-24 DIAGNOSIS — R278 Other lack of coordination: Secondary | ICD-10-CM | POA: Diagnosis not present

## 2015-03-25 DIAGNOSIS — M6281 Muscle weakness (generalized): Secondary | ICD-10-CM | POA: Diagnosis not present

## 2015-03-25 DIAGNOSIS — R278 Other lack of coordination: Secondary | ICD-10-CM | POA: Diagnosis not present

## 2015-03-25 DIAGNOSIS — R2689 Other abnormalities of gait and mobility: Secondary | ICD-10-CM | POA: Diagnosis not present

## 2015-03-25 DIAGNOSIS — R488 Other symbolic dysfunctions: Secondary | ICD-10-CM | POA: Diagnosis not present

## 2015-03-26 DIAGNOSIS — M6281 Muscle weakness (generalized): Secondary | ICD-10-CM | POA: Diagnosis not present

## 2015-03-26 DIAGNOSIS — R278 Other lack of coordination: Secondary | ICD-10-CM | POA: Diagnosis not present

## 2015-03-26 DIAGNOSIS — R488 Other symbolic dysfunctions: Secondary | ICD-10-CM | POA: Diagnosis not present

## 2015-03-26 DIAGNOSIS — R2689 Other abnormalities of gait and mobility: Secondary | ICD-10-CM | POA: Diagnosis not present

## 2015-03-28 DIAGNOSIS — R488 Other symbolic dysfunctions: Secondary | ICD-10-CM | POA: Diagnosis not present

## 2015-03-28 DIAGNOSIS — R2689 Other abnormalities of gait and mobility: Secondary | ICD-10-CM | POA: Diagnosis not present

## 2015-03-28 DIAGNOSIS — R278 Other lack of coordination: Secondary | ICD-10-CM | POA: Diagnosis not present

## 2015-03-28 DIAGNOSIS — M6281 Muscle weakness (generalized): Secondary | ICD-10-CM | POA: Diagnosis not present

## 2015-03-30 ENCOUNTER — Other Ambulatory Visit: Payer: Self-pay | Admitting: Family Medicine

## 2015-03-31 DIAGNOSIS — R2689 Other abnormalities of gait and mobility: Secondary | ICD-10-CM | POA: Diagnosis not present

## 2015-03-31 DIAGNOSIS — M6281 Muscle weakness (generalized): Secondary | ICD-10-CM | POA: Diagnosis not present

## 2015-03-31 DIAGNOSIS — R488 Other symbolic dysfunctions: Secondary | ICD-10-CM | POA: Diagnosis not present

## 2015-03-31 DIAGNOSIS — R278 Other lack of coordination: Secondary | ICD-10-CM | POA: Diagnosis not present

## 2015-04-01 DIAGNOSIS — R488 Other symbolic dysfunctions: Secondary | ICD-10-CM | POA: Diagnosis not present

## 2015-04-01 DIAGNOSIS — R2689 Other abnormalities of gait and mobility: Secondary | ICD-10-CM | POA: Diagnosis not present

## 2015-04-01 DIAGNOSIS — R278 Other lack of coordination: Secondary | ICD-10-CM | POA: Diagnosis not present

## 2015-04-01 DIAGNOSIS — M6281 Muscle weakness (generalized): Secondary | ICD-10-CM | POA: Diagnosis not present

## 2015-04-02 DIAGNOSIS — R2689 Other abnormalities of gait and mobility: Secondary | ICD-10-CM | POA: Diagnosis not present

## 2015-04-02 DIAGNOSIS — R488 Other symbolic dysfunctions: Secondary | ICD-10-CM | POA: Diagnosis not present

## 2015-04-02 DIAGNOSIS — R278 Other lack of coordination: Secondary | ICD-10-CM | POA: Diagnosis not present

## 2015-04-02 DIAGNOSIS — M6281 Muscle weakness (generalized): Secondary | ICD-10-CM | POA: Diagnosis not present

## 2015-04-03 DIAGNOSIS — M6281 Muscle weakness (generalized): Secondary | ICD-10-CM | POA: Diagnosis not present

## 2015-04-03 DIAGNOSIS — R2689 Other abnormalities of gait and mobility: Secondary | ICD-10-CM | POA: Diagnosis not present

## 2015-04-03 DIAGNOSIS — R278 Other lack of coordination: Secondary | ICD-10-CM | POA: Diagnosis not present

## 2015-04-03 DIAGNOSIS — R488 Other symbolic dysfunctions: Secondary | ICD-10-CM | POA: Diagnosis not present

## 2015-04-04 DIAGNOSIS — R488 Other symbolic dysfunctions: Secondary | ICD-10-CM | POA: Diagnosis not present

## 2015-04-04 DIAGNOSIS — R2689 Other abnormalities of gait and mobility: Secondary | ICD-10-CM | POA: Diagnosis not present

## 2015-04-04 DIAGNOSIS — R278 Other lack of coordination: Secondary | ICD-10-CM | POA: Diagnosis not present

## 2015-04-04 DIAGNOSIS — M6281 Muscle weakness (generalized): Secondary | ICD-10-CM | POA: Diagnosis not present

## 2015-04-08 DIAGNOSIS — M6281 Muscle weakness (generalized): Secondary | ICD-10-CM | POA: Diagnosis not present

## 2015-04-08 DIAGNOSIS — R488 Other symbolic dysfunctions: Secondary | ICD-10-CM | POA: Diagnosis not present

## 2015-04-08 DIAGNOSIS — R2689 Other abnormalities of gait and mobility: Secondary | ICD-10-CM | POA: Diagnosis not present

## 2015-04-08 DIAGNOSIS — R278 Other lack of coordination: Secondary | ICD-10-CM | POA: Diagnosis not present

## 2015-04-09 DIAGNOSIS — R2689 Other abnormalities of gait and mobility: Secondary | ICD-10-CM | POA: Diagnosis not present

## 2015-04-09 DIAGNOSIS — M6281 Muscle weakness (generalized): Secondary | ICD-10-CM | POA: Diagnosis not present

## 2015-04-09 DIAGNOSIS — R488 Other symbolic dysfunctions: Secondary | ICD-10-CM | POA: Diagnosis not present

## 2015-04-09 DIAGNOSIS — R278 Other lack of coordination: Secondary | ICD-10-CM | POA: Diagnosis not present

## 2015-04-10 DIAGNOSIS — R2689 Other abnormalities of gait and mobility: Secondary | ICD-10-CM | POA: Diagnosis not present

## 2015-04-10 DIAGNOSIS — R488 Other symbolic dysfunctions: Secondary | ICD-10-CM | POA: Diagnosis not present

## 2015-04-10 DIAGNOSIS — M6281 Muscle weakness (generalized): Secondary | ICD-10-CM | POA: Diagnosis not present

## 2015-04-10 DIAGNOSIS — R278 Other lack of coordination: Secondary | ICD-10-CM | POA: Diagnosis not present

## 2015-04-11 DIAGNOSIS — M6281 Muscle weakness (generalized): Secondary | ICD-10-CM | POA: Diagnosis not present

## 2015-04-11 DIAGNOSIS — R278 Other lack of coordination: Secondary | ICD-10-CM | POA: Diagnosis not present

## 2015-04-11 DIAGNOSIS — R2689 Other abnormalities of gait and mobility: Secondary | ICD-10-CM | POA: Diagnosis not present

## 2015-04-11 DIAGNOSIS — R488 Other symbolic dysfunctions: Secondary | ICD-10-CM | POA: Diagnosis not present

## 2015-04-16 DIAGNOSIS — R278 Other lack of coordination: Secondary | ICD-10-CM | POA: Diagnosis not present

## 2015-04-16 DIAGNOSIS — M6281 Muscle weakness (generalized): Secondary | ICD-10-CM | POA: Diagnosis not present

## 2015-04-16 DIAGNOSIS — R488 Other symbolic dysfunctions: Secondary | ICD-10-CM | POA: Diagnosis not present

## 2015-04-16 DIAGNOSIS — R2689 Other abnormalities of gait and mobility: Secondary | ICD-10-CM | POA: Diagnosis not present

## 2015-04-26 ENCOUNTER — Other Ambulatory Visit: Payer: Self-pay | Admitting: Family Medicine

## 2015-04-28 NOTE — Telephone Encounter (Signed)
Ultracet refill requested. Last seen 04/16/15 and filled 02/25/15 #30 with 1 refill.    Please advise    KP

## 2015-05-21 DIAGNOSIS — M6281 Muscle weakness (generalized): Secondary | ICD-10-CM | POA: Diagnosis not present

## 2015-05-21 DIAGNOSIS — R278 Other lack of coordination: Secondary | ICD-10-CM | POA: Diagnosis not present

## 2015-05-21 DIAGNOSIS — R2689 Other abnormalities of gait and mobility: Secondary | ICD-10-CM | POA: Diagnosis not present

## 2015-05-22 DIAGNOSIS — M6281 Muscle weakness (generalized): Secondary | ICD-10-CM | POA: Diagnosis not present

## 2015-05-22 DIAGNOSIS — R278 Other lack of coordination: Secondary | ICD-10-CM | POA: Diagnosis not present

## 2015-05-22 DIAGNOSIS — R2689 Other abnormalities of gait and mobility: Secondary | ICD-10-CM | POA: Diagnosis not present

## 2015-05-23 DIAGNOSIS — M6281 Muscle weakness (generalized): Secondary | ICD-10-CM | POA: Diagnosis not present

## 2015-05-23 DIAGNOSIS — R278 Other lack of coordination: Secondary | ICD-10-CM | POA: Diagnosis not present

## 2015-05-23 DIAGNOSIS — R2689 Other abnormalities of gait and mobility: Secondary | ICD-10-CM | POA: Diagnosis not present

## 2015-05-24 ENCOUNTER — Other Ambulatory Visit: Payer: Self-pay | Admitting: Family Medicine

## 2015-05-26 NOTE — Telephone Encounter (Signed)
Please schedule this patient an apt with Dr.Lowne.      KP

## 2015-05-26 NOTE — Telephone Encounter (Signed)
Appointment scheduled for 06/03/15

## 2015-05-27 ENCOUNTER — Other Ambulatory Visit: Payer: Self-pay | Admitting: Family Medicine

## 2015-05-29 ENCOUNTER — Other Ambulatory Visit: Payer: Self-pay | Admitting: Family Medicine

## 2015-05-29 NOTE — Telephone Encounter (Signed)
Lisinopril 20 on the medication list and has on been prescribed once on [facility]16/14 at the [facility]. Please advise    KP

## 2015-06-03 ENCOUNTER — Ambulatory Visit: Payer: Self-pay | Admitting: Family Medicine

## 2015-06-05 ENCOUNTER — Ambulatory Visit (INDEPENDENT_AMBULATORY_CARE_PROVIDER_SITE_OTHER): Payer: Medicare Other | Admitting: Family Medicine

## 2015-06-05 ENCOUNTER — Encounter: Payer: Self-pay | Admitting: Family Medicine

## 2015-06-05 VITALS — BP 137/79 | HR 63 | Temp 98.4°F | Resp 18 | Ht 68.0 in | Wt 134.2 lb

## 2015-06-05 DIAGNOSIS — Z8673 Personal history of transient ischemic attack (TIA), and cerebral infarction without residual deficits: Secondary | ICD-10-CM

## 2015-06-05 DIAGNOSIS — E46 Unspecified protein-calorie malnutrition: Secondary | ICD-10-CM | POA: Insufficient documentation

## 2015-06-05 DIAGNOSIS — F0391 Unspecified dementia with behavioral disturbance: Secondary | ICD-10-CM

## 2015-06-05 DIAGNOSIS — I10 Essential (primary) hypertension: Secondary | ICD-10-CM

## 2015-06-05 DIAGNOSIS — E785 Hyperlipidemia, unspecified: Secondary | ICD-10-CM | POA: Diagnosis not present

## 2015-06-05 DIAGNOSIS — F03918 Unspecified dementia, unspecified severity, with other behavioral disturbance: Secondary | ICD-10-CM

## 2015-06-05 LAB — CBC WITH DIFFERENTIAL/PLATELET
BASOS ABS: 0 10*3/uL (ref 0.0–0.1)
Basophils Relative: 0.3 % (ref 0.0–3.0)
EOS PCT: 1.5 % (ref 0.0–5.0)
Eosinophils Absolute: 0.1 10*3/uL (ref 0.0–0.7)
HCT: 46.1 % — ABNORMAL HIGH (ref 36.0–46.0)
HEMOGLOBIN: 15.4 g/dL — AB (ref 12.0–15.0)
Lymphocytes Relative: 29.4 % (ref 12.0–46.0)
Lymphs Abs: 1.6 10*3/uL (ref 0.7–4.0)
MCHC: 33.5 g/dL (ref 30.0–36.0)
MCV: 93.1 fl (ref 78.0–100.0)
MONO ABS: 0.5 10*3/uL (ref 0.1–1.0)
Monocytes Relative: 8.8 % (ref 3.0–12.0)
NEUTROS ABS: 3.3 10*3/uL (ref 1.4–7.7)
NEUTROS PCT: 60 % (ref 43.0–77.0)
PLATELETS: 186 10*3/uL (ref 150.0–400.0)
RBC: 4.95 Mil/uL (ref 3.87–5.11)
RDW: 14.6 % (ref 11.5–15.5)
WBC: 5.5 10*3/uL (ref 4.0–10.5)

## 2015-06-05 LAB — LIPID PANEL
CHOL/HDL RATIO: 3
Cholesterol: 124 mg/dL (ref 0–200)
HDL: 44.4 mg/dL (ref >39.00)
LDL Cholesterol: 57 mg/dL (ref 0–99)
NonHDL: 79.6
Triglycerides: 111 mg/dL (ref 0.0–149.0)
VLDL: 22.2 mg/dL (ref 0.0–40.0)

## 2015-06-05 LAB — HEPATIC FUNCTION PANEL
ALK PHOS: 47 U/L (ref 39–117)
ALT: 9 U/L (ref 0–35)
AST: 14 U/L (ref 0–37)
Albumin: 4.2 g/dL (ref 3.5–5.2)
Bilirubin, Direct: 0.3 mg/dL (ref 0.0–0.3)
TOTAL PROTEIN: 6.4 g/dL (ref 6.0–8.3)
Total Bilirubin: 1.4 mg/dL — ABNORMAL HIGH (ref 0.2–1.2)

## 2015-06-05 LAB — BASIC METABOLIC PANEL
BUN: 15 mg/dL (ref 6–23)
CALCIUM: 9.6 mg/dL (ref 8.4–10.5)
CO2: 28 mEq/L (ref 19–32)
Chloride: 101 mEq/L (ref 96–112)
Creatinine, Ser: 0.9 mg/dL (ref 0.40–1.20)
GFR: 63.51 mL/min (ref >60.00)
GLUCOSE: 109 mg/dL — AB (ref 70–99)
POTASSIUM: 3.5 meq/L (ref 3.5–5.1)
Sodium: 140 mEq/L (ref 135–145)

## 2015-06-05 LAB — TSH: TSH: 0.73 u[IU]/mL (ref 0.35–4.50)

## 2015-06-05 NOTE — Progress Notes (Signed)
Pre visit review using our clinic review tool, if applicable. No additional management support is needed unless otherwise documented below in the visit note. 

## 2015-06-05 NOTE — Patient Instructions (Addendum)
Malnutrition Many of us think of malnutrition as a condition in which there is not enough to eat. Malnutrition is actually any condition where nutrition is poor. This means:  Too much to eat as we see in conditions of obesity.  Too little to eat with starvation. The following information is only for the malnourished with dietary deficiencies (poor diet). CAUSES  Under-nutrition can result from:  Poor intake.  Malabsorption  Lactation  Bleeding  Diarrhea  Old age.  Kidney failure.  Infancy  Poverty  Infection  Adolescence  Excessive sweating  Drug addiction  Pregnancy  Early childhood. Under-nutrition comes anytime the demand is more than the intake. SYMPTOMS  The problems depend on what type of malnutrition is present. Some general symptoms include:  Fatigue.  Dizziness.  Fainting  Weight loss.  Poor immune response.  Lack of menstruation.  Lack of growth in children.  Hair loss. DIAGNOSIS  Your caregiver will usually suspect malnutrition based on results of your:   Medical and dietary history.  Physical exam. This will often include measurements of your BMI (body mass index).  Perhaps some blood tests. These may include: plasma levels of nutrients and nutrient-dependent substances, such as:  Hemoglobin  Thyroid hormones  Transferrin  Albumin RISK FACTORS Persons in the following circumstances may be at risk of malnutrition.  Infants and children are at risk of under-nutrition. This is because of their high demand for energy and essential nutrients. Protein-energy malnutrition in children consuming inadequate amounts of protein, calories, and other nutrients is a particularly severe form of under-nutrition that delays growth and development. This includes [place] and [place].  Hemorrhagic disease of the newborn is a life-threatening disorder. This is due to a lack of vitamin K, iron, folic acid, vitamin C, copper, zinc, and vitamin  A. This may occur in inadequately fed infants and children.  In adolescence, nutritional requirements increase because they are growing. Anorexia nervosa, a form of starvation, may affect adolescents.  Pregnancy and lactation. Requirements for all nutrients are increased during pregnancy and lactation.  Abnormal diets, such as pica (the consumption of nonnutritive substances, such as clay and charcoal), are common in pregnancy.  Anemia due to folic acid deficiency is common in pregnant women. This is especially true for those who have taken oral contraceptives. Folic acid supplements are now recommended for pregnant women. Folic acid prevents neural tube defects (spina bifida) in children.  Breast-fed-only infants may develop vitamin B12 deficiency if the mother is a vegan.  An alcoholic mother may have a handicapped and stunted child with fetal alcohol syndrome. This is due to the effects of alcohol on the fetus. Do not drink during pregnancy.  Old age: A weakened sense of taste and smell, loneliness, physical and mental handicaps, immobility, and chronic illness can hurt the food intake in the elderly. Absorption is reduced. This may add to iron deficiency, calcium and bone problems and also a softening of the bones due to lack of vitamin D. This is also made worse by not being in the sun.  With aging, we loose lean body mass. These changes and a reduction in physical activity result in lower energy and protein requirements compared with those of younger adults.  Chronic disease including malabsorption states (including those resulting from surgery) tend to impair the absorption of fat-soluble vitamins, vitamin B12, calcium, and iron.  Liver disease impairs the storage of vitamins A and B12. It also interferes with the metabolism of protein and energy sources.  Kidney disease may   cause deficiencies of protein, iron, and vitamin D.  Cancer and AIDS may cause anorexia. This is a loss of  appetite.  Vegetarian diet. The most common form of this type of diet is when meat and fish are not eaten, but eggs and dairy products are eaten. Iron deficiency is the only risk. Ovo-lacto vegetarians tend to live longer and to develop fewer chronic disabling conditions than their meat-eating peers. However, their lifestyle usually includes regular exercise and abstention from alcohol and tobacco. This may contribute to better health. Vegans consume no animal products and are susceptible to vitamin B12 deficiency. Yeast extracts and oriental-style fermented foods provide this vitamin. Intake of calcium, iron, and zinc also tends to be low. A fruitarian diet (eat only fruit) is deficient in protein, salt, and many micronutrients. This is not recommended.  Fad diets: Many commercial diets are claimed to enhance well-being or reduce weight. A physician should be alert to early evidence of nutrient deficiency or toxicity in patients on these diets. Such diets have resulted in vitamin, mineral, and protein deficiency states and cardiac, renal, and metabolic disorders. Some fad diets have resulted in death. People on very low calorie diets (less than 400 kcal/day) cannot sustain health for long. Some trace mineral supplements have induced toxicity.  Alcohol or drug dependency: Addiction leads to a troubled lifestyle in which adequate nourishment is ignored. Absorption and metabolism of nutrients are impaired. High levels of alcohol are poisonous. Too much alcohol can cause tissue injury, particularly of the GI tract, liver, pancreas, brain, and peripheral nervous system. Beer drinkers who consume food may gain weight, but alcoholics who use more than one quart of hard liquor per day lose weight and become undernourished. Drug addicts are usually very skinny. Alcoholism is the most common cause of thiamine deficiency and may lead to deficiencies of magnesium, zinc, and other vitamins. TREATMENT  Get treatment if  you experience changes in how your body is working.  PREVENTION  Eating a good, well-balanced diet helps to prevent most forms of malnutrition. Document Released: 09/17/2005 Document Revised: 01/24/2012 Document Reviewed: 10/09/2007 Exit[employer] Patient Information 2015 [employer]. This information is not intended to replace advice given to you by your health care provider. Make sure you discuss any questions you have with your health care provider.  

## 2015-06-05 NOTE — Assessment & Plan Note (Signed)
Stable, cont lisinopril

## 2015-06-05 NOTE — Progress Notes (Signed)
Patient ID: Kimberly Craig, female    DOB: 1930/03/30  Age: 81 y.o. MRN: [id number]    Subjective:  Subjective HPI Kimberly Craig presents for f/u----son is present.    Review of Systems  Constitutional: Positive for appetite change. Negative for activity change, fatigue and unexpected weight change.  Respiratory: Negative for cough and shortness of breath.   Cardiovascular: Negative for chest pain and palpitations.  Psychiatric/Behavioral: Positive for behavioral problems, confusion, sleep disturbance and decreased concentration. Negative for suicidal ideas, self-injury and dysphoric mood. The patient is not nervous/anxious.     History Past Medical History  Diagnosis Date  . Arthritis   . Hypertension   . Hyperlipidemia   . Insomnia   . TIA (transient ischemic attack)   . Uterine cancer   . Dementia   . Stroke 2015    She has past surgical history that includes no surgical history (03/2012).   Her family history includes Stroke in her father.She reports that she has never smoked. She has never used smokeless tobacco. She reports that she does not drink alcohol or use illicit drugs.  Current Outpatient Prescriptions on File Prior to Visit  Medication Sig Dispense Refill  . atorvastatin (LIPITOR) 10 MG tablet Take 1 tablet (10 mg total) by mouth daily at 6 PM. 90 tablet 0  . calcium gluconate 500 MG tablet Take 500 mg by mouth daily.    . clopidogrel (PLAVIX) 75 MG tablet TAKE 1 TABLET BY MOUTH EVERY DAY 30 tablet 0  . donepezil (ARICEPT) 23 MG TABS tablet TAKE 1 TABLET BY MOUTH AT BEDTIME. OFFICE VISIT DUE NOW 30 tablet 0  . lisinopril (PRINIVIL,ZESTRIL) 20 MG tablet Take 1 tablet (20 mg total) by mouth daily. 31 tablet 0  . traMADol-acetaminophen (ULTRACET) 37.5-325 MG per tablet TAKE 1 TABLET BY MOUTH EVERY 6 HOURS AS NEEDED. 30 tablet 1  . valACYclovir (VALTREX) 1000 MG tablet Take 1 tablet (1,000 mg total) by mouth 3 (three) times daily. Take for 6 days then stop. (Patient  not taking: Reported on 06/05/2015) 18 tablet 0   No current facility-administered medications on file prior to visit.     Objective:  Objective Physical Exam  Constitutional: She appears well-developed and well-nourished.  HENT:  Head: Normocephalic and atraumatic.  Eyes: Conjunctivae and EOM are normal.  Neck: Normal range of motion. Neck supple. No JVD present. Carotid bruit is not present. No thyromegaly present.  Cardiovascular: Normal rate, regular rhythm and normal heart sounds.   No murmur heard. Pulmonary/Chest: Effort normal and breath sounds normal. No respiratory distress. She has no wheezes. She has no rales. She exhibits no tenderness.  Musculoskeletal: She exhibits no edema.  Neurological: She is alert. She is disoriented.  Psychiatric: Her mood appears not anxious. She is slowed. She does not exhibit a depressed mood. She expresses no homicidal and no suicidal ideation. She exhibits abnormal recent memory. She exhibits normal remote memory.  Combative at times   BP 137/79 mmHg  Pulse 63  Temp(Src) 98.4 F (36.9 C) (Oral)  Resp 18  Ht 5\' 8"  (1.727 m)  Wt 134 lb 3.2 oz (60.873 kg)  BMI 20.41 kg/m2  SpO2 97% Wt Readings from Last 3 Encounters:  06/05/15 134 lb 3.2 oz (60.873 kg)  08/19/14 160 lb 3.2 oz (72.666 kg)  08/06/14 164 lb (74.39 kg)     Lab Results  Component Value Date   WBC 5.5 06/05/2015   HGB 15.4* 06/05/2015   HCT 46.1* 06/05/2015  PLT 186.0 06/05/2015   GLUCOSE 109* 06/05/2015   CHOL 124 06/05/2015   TRIG 111.0 06/05/2015   HDL 44.40 06/05/2015   LDLDIRECT 174.4 02/10/2012   LDLCALC 57 06/05/2015   ALT 9 06/05/2015   AST 14 06/05/2015   NA 140 06/05/2015   K 3.5 06/05/2015   CL 101 06/05/2015   CREATININE 0.90 06/05/2015   BUN 15 06/05/2015   CO2 28 06/05/2015   TSH 0.73 06/05/2015   INR 1.07 07/12/2014   HGBA1C 6.2* 07/13/2014    Dg Thoracic Spine 2 View  02/26/2015   CLINICAL DATA:  81 year old female post fall getting out  of bed. Mid and lower back pain.  EXAM: THORACIC SPINE - 2 VIEW  COMPARISON:  None.  FINDINGS: Diffuse bony under mineralization. The alignment is maintained. Vertebral body heights are maintained. Normal for age disc space narrowing, Schmorl's node superior endplate of G29. Posterior elements appear intact. There is no paravertebral soft tissue abnormality.  IMPRESSION: No radiographic findings of fracture or subluxation of the thoracic spine.   Electronically Signed   By: Jeb Levering M.D.   On: 02/26/2015 19:54   Dg Lumbar Spine Complete  02/26/2015   CLINICAL DATA:  Fall getting in to bed earlier this day, now with low back pain.  EXAM: LUMBAR SPINE - COMPLETE 4+ VIEW  COMPARISON:  None.  FINDINGS: Diffuse bony under mineralization. The alignment is maintained. Vertebral body heights are normal. There is no listhesis. The posterior elements are intact. There is disc space narrowing and endplate spurring at B2-W4. No fracture. Sacroiliac joints are congruent.  IMPRESSION: No fracture or subluxation of the lumbar spine.   Electronically Signed   By: Jeb Levering M.D.   On: 02/26/2015 19:56   Ct Head Wo Contrast  02/26/2015   CLINICAL DATA:  [place] tonight.  Hit head.  EXAM: CT HEAD WITHOUT CONTRAST  CT CERVICAL SPINE WITHOUT CONTRAST  TECHNIQUE: Multidetector CT imaging of the head and cervical spine was performed following the standard protocol without intravenous contrast. Multiplanar CT image reconstructions of the cervical spine were also generated.  COMPARISON:  Head CT 07/12/2014  FINDINGS: CT HEAD FINDINGS  Stable age related cerebral atrophy, ventriculomegaly and periventricular white matter disease. No extra-axial fluid collections are identified. No CT findings for acute hemispheric infarction or intracranial hemorrhage. No mass lesions. The brainstem and cerebellum are normal.  No acute skull fracture. The paranasal sinuses and mastoid air cells are clear. The globes are intact.  CT CERVICAL  SPINE FINDINGS  Degenerative cervical spondylosis with disc disease mainly at C5-6 and C6-7 and fairly significant multilevel facet disease. No acute fracture is identified. No abnormal prevertebral soft tissue swelling. The skullbase C1 and C1-2 articulations are maintained.  IMPRESSION: Stable age related cerebral atrophy, ventriculomegaly and periventricular white matter disease. No acute intracranial findings or acute skull fracture.  Degenerative cervical spondylosis with multilevel disc disease and facet disease but no definite acute cervical spine fracture.   Electronically Signed   By: Marijo Sanes M.D.   On: 02/26/2015 19:58   Ct Cervical Spine Wo Contrast  02/26/2015   CLINICAL DATA:  [place] tonight.  Hit head.  EXAM: CT HEAD WITHOUT CONTRAST  CT CERVICAL SPINE WITHOUT CONTRAST  TECHNIQUE: Multidetector CT imaging of the head and cervical spine was performed following the standard protocol without intravenous contrast. Multiplanar CT image reconstructions of the cervical spine were also generated.  COMPARISON:  Head CT 07/12/2014  FINDINGS: CT HEAD FINDINGS  Stable age  related cerebral atrophy, ventriculomegaly and periventricular white matter disease. No extra-axial fluid collections are identified. No CT findings for acute hemispheric infarction or intracranial hemorrhage. No mass lesions. The brainstem and cerebellum are normal.  No acute skull fracture. The paranasal sinuses and mastoid air cells are clear. The globes are intact.  CT CERVICAL SPINE FINDINGS  Degenerative cervical spondylosis with disc disease mainly at C5-6 and C6-7 and fairly significant multilevel facet disease. No acute fracture is identified. No abnormal prevertebral soft tissue swelling. The skullbase C1 and C1-2 articulations are maintained.  IMPRESSION: Stable age related cerebral atrophy, ventriculomegaly and periventricular white matter disease. No acute intracranial findings or acute skull fracture.  Degenerative cervical  spondylosis with multilevel disc disease and facet disease but no definite acute cervical spine fracture.   Electronically Signed   By: Marijo Sanes M.D.   On: 02/26/2015 19:58     Assessment & Plan:  Plan I have discontinued Ms. Loren's aspirin. I am also having her maintain her calcium gluconate, valACYclovir, lisinopril, traMADol-acetaminophen, donepezil, clopidogrel, and atorvastatin.  No orders of the defined types were placed in this encounter.    Problem List Items Addressed This Visit    Protein-calorie malnutrition    Boost tid Encourage pt to eat meals rto 3 months to reevaluate Home health referral in      Hyperlipidemia    con't lipitor      Relevant Orders   Ambulatory referral to [place] metabolic panel (Completed)   CBC with Differential/Platelet (Completed)   Hepatic function panel (Completed)   Lipid panel (Completed)   TSH (Completed)   HTN (hypertension)    Stable, con't lisinopril      Relevant Orders   Ambulatory referral to [place] metabolic panel (Completed)   CBC with Differential/Platelet (Completed)   Hepatic function panel (Completed)   Lipid panel (Completed)   TSH (Completed)    Other Visit Diagnoses    Dementia with behavioral disturbance    -  Primary    Relevant Orders    Ambulatory referral to [facility]    History of CVA (cerebrovascular accident)        Relevant Orders    Ambulatory referral to Home Health       Follow-up: Return if symptoms worsen or fail to improve, for hypertension, hyperlipidemia, annual exam, fasting.  Garnet Koyanagi, DO

## 2015-06-05 NOTE — Assessment & Plan Note (Signed)
Boost tid Encourage pt to eat meals rto 3 months to reevaluate Home health referral in

## 2015-06-05 NOTE — Assessment & Plan Note (Signed)
con't lipitor

## 2015-06-06 DIAGNOSIS — Z8673 Personal history of transient ischemic attack (TIA), and cerebral infarction without residual deficits: Secondary | ICD-10-CM | POA: Diagnosis not present

## 2015-06-06 DIAGNOSIS — Z9181 History of falling: Secondary | ICD-10-CM | POA: Diagnosis not present

## 2015-06-06 DIAGNOSIS — I10 Essential (primary) hypertension: Secondary | ICD-10-CM | POA: Diagnosis not present

## 2015-06-06 DIAGNOSIS — M1990 Unspecified osteoarthritis, unspecified site: Secondary | ICD-10-CM | POA: Diagnosis not present

## 2015-06-06 DIAGNOSIS — E46 Unspecified protein-calorie malnutrition: Secondary | ICD-10-CM | POA: Diagnosis not present

## 2015-06-06 DIAGNOSIS — F0391 Unspecified dementia with behavioral disturbance: Secondary | ICD-10-CM | POA: Diagnosis not present

## 2015-06-09 ENCOUNTER — Telehealth: Payer: Self-pay | Admitting: Family Medicine

## 2015-06-09 DIAGNOSIS — Z9181 History of falling: Secondary | ICD-10-CM | POA: Diagnosis not present

## 2015-06-09 DIAGNOSIS — F0391 Unspecified dementia with behavioral disturbance: Secondary | ICD-10-CM | POA: Diagnosis not present

## 2015-06-09 DIAGNOSIS — I10 Essential (primary) hypertension: Secondary | ICD-10-CM | POA: Diagnosis not present

## 2015-06-09 DIAGNOSIS — M1990 Unspecified osteoarthritis, unspecified site: Secondary | ICD-10-CM | POA: Diagnosis not present

## 2015-06-09 DIAGNOSIS — E46 Unspecified protein-calorie malnutrition: Secondary | ICD-10-CM | POA: Diagnosis not present

## 2015-06-09 DIAGNOSIS — Z8673 Personal history of transient ischemic attack (TIA), and cerebral infarction without residual deficits: Secondary | ICD-10-CM | POA: Diagnosis not present

## 2015-06-09 NOTE — Telephone Encounter (Signed)
Caller name:Scott with [facility] Can be reached: [phone]  Reason for call: Please call with verbal orders for home health start of care beginning 06/06/15. Nursing once a week for 4 weeks, eval for PT, and Medical social worker

## 2015-06-09 NOTE — Telephone Encounter (Signed)
Verbal given to [employer].     KP

## 2015-06-10 ENCOUNTER — Telehealth: Payer: Self-pay | Admitting: Family Medicine

## 2015-06-10 NOTE — Telephone Encounter (Signed)
Caller name: Verdis Frederickson  Relation to pt: PT from [place] Call back number: [phone]   Reason for call:  Requesting verbal orders home health twice week for 6 weeks strengthening, transferring, balancing.

## 2015-06-11 DIAGNOSIS — M1990 Unspecified osteoarthritis, unspecified site: Secondary | ICD-10-CM | POA: Diagnosis not present

## 2015-06-11 DIAGNOSIS — E46 Unspecified protein-calorie malnutrition: Secondary | ICD-10-CM | POA: Diagnosis not present

## 2015-06-11 DIAGNOSIS — I10 Essential (primary) hypertension: Secondary | ICD-10-CM | POA: Diagnosis not present

## 2015-06-11 DIAGNOSIS — Z9181 History of falling: Secondary | ICD-10-CM | POA: Diagnosis not present

## 2015-06-11 DIAGNOSIS — F0391 Unspecified dementia with behavioral disturbance: Secondary | ICD-10-CM | POA: Diagnosis not present

## 2015-06-11 DIAGNOSIS — Z8673 Personal history of transient ischemic attack (TIA), and cerebral infarction without residual deficits: Secondary | ICD-10-CM | POA: Diagnosis not present

## 2015-06-11 NOTE — Telephone Encounter (Signed)
Verbal authorization left on the VM of Kimberly Craig.    KP

## 2015-06-12 DIAGNOSIS — E46 Unspecified protein-calorie malnutrition: Secondary | ICD-10-CM | POA: Diagnosis not present

## 2015-06-12 DIAGNOSIS — M1990 Unspecified osteoarthritis, unspecified site: Secondary | ICD-10-CM | POA: Diagnosis not present

## 2015-06-12 DIAGNOSIS — I10 Essential (primary) hypertension: Secondary | ICD-10-CM | POA: Diagnosis not present

## 2015-06-12 DIAGNOSIS — Z8673 Personal history of transient ischemic attack (TIA), and cerebral infarction without residual deficits: Secondary | ICD-10-CM | POA: Diagnosis not present

## 2015-06-12 DIAGNOSIS — Z9181 History of falling: Secondary | ICD-10-CM | POA: Diagnosis not present

## 2015-06-12 DIAGNOSIS — F0391 Unspecified dementia with behavioral disturbance: Secondary | ICD-10-CM | POA: Diagnosis not present

## 2015-06-13 ENCOUNTER — Telehealth: Payer: Self-pay | Admitting: Family Medicine

## 2015-06-13 DIAGNOSIS — Z8673 Personal history of transient ischemic attack (TIA), and cerebral infarction without residual deficits: Secondary | ICD-10-CM | POA: Diagnosis not present

## 2015-06-13 DIAGNOSIS — M1990 Unspecified osteoarthritis, unspecified site: Secondary | ICD-10-CM | POA: Diagnosis not present

## 2015-06-13 DIAGNOSIS — Z9181 History of falling: Secondary | ICD-10-CM | POA: Diagnosis not present

## 2015-06-13 DIAGNOSIS — E46 Unspecified protein-calorie malnutrition: Secondary | ICD-10-CM | POA: Diagnosis not present

## 2015-06-13 DIAGNOSIS — F0391 Unspecified dementia with behavioral disturbance: Secondary | ICD-10-CM | POA: Diagnosis not present

## 2015-06-13 DIAGNOSIS — I10 Essential (primary) hypertension: Secondary | ICD-10-CM | POA: Diagnosis not present

## 2015-06-13 NOTE — Telephone Encounter (Signed)
Verbal left on VM.      KP

## 2015-06-13 NOTE — Telephone Encounter (Signed)
Caller name: Izora Gala from [place]  Relation to pt:OT Call back number: [phone]  Reason for call:  OT requesting 2x a week for 6 weeks for ADL and strengthening- verbal orders

## 2015-06-17 DIAGNOSIS — Z9181 History of falling: Secondary | ICD-10-CM | POA: Diagnosis not present

## 2015-06-17 DIAGNOSIS — M1990 Unspecified osteoarthritis, unspecified site: Secondary | ICD-10-CM | POA: Diagnosis not present

## 2015-06-17 DIAGNOSIS — E46 Unspecified protein-calorie malnutrition: Secondary | ICD-10-CM | POA: Diagnosis not present

## 2015-06-17 DIAGNOSIS — Z8673 Personal history of transient ischemic attack (TIA), and cerebral infarction without residual deficits: Secondary | ICD-10-CM | POA: Diagnosis not present

## 2015-06-17 DIAGNOSIS — I10 Essential (primary) hypertension: Secondary | ICD-10-CM | POA: Diagnosis not present

## 2015-06-17 DIAGNOSIS — F0391 Unspecified dementia with behavioral disturbance: Secondary | ICD-10-CM | POA: Diagnosis not present

## 2015-06-18 DIAGNOSIS — Z9181 History of falling: Secondary | ICD-10-CM | POA: Diagnosis not present

## 2015-06-18 DIAGNOSIS — M1990 Unspecified osteoarthritis, unspecified site: Secondary | ICD-10-CM | POA: Diagnosis not present

## 2015-06-18 DIAGNOSIS — Z8673 Personal history of transient ischemic attack (TIA), and cerebral infarction without residual deficits: Secondary | ICD-10-CM | POA: Diagnosis not present

## 2015-06-18 DIAGNOSIS — E46 Unspecified protein-calorie malnutrition: Secondary | ICD-10-CM | POA: Diagnosis not present

## 2015-06-18 DIAGNOSIS — F0391 Unspecified dementia with behavioral disturbance: Secondary | ICD-10-CM | POA: Diagnosis not present

## 2015-06-18 DIAGNOSIS — I10 Essential (primary) hypertension: Secondary | ICD-10-CM | POA: Diagnosis not present

## 2015-06-19 ENCOUNTER — Telehealth: Payer: Self-pay | Admitting: *Deleted

## 2015-06-19 DIAGNOSIS — Z8673 Personal history of transient ischemic attack (TIA), and cerebral infarction without residual deficits: Secondary | ICD-10-CM | POA: Diagnosis not present

## 2015-06-19 DIAGNOSIS — E46 Unspecified protein-calorie malnutrition: Secondary | ICD-10-CM | POA: Diagnosis not present

## 2015-06-19 DIAGNOSIS — Z9181 History of falling: Secondary | ICD-10-CM | POA: Diagnosis not present

## 2015-06-19 DIAGNOSIS — I10 Essential (primary) hypertension: Secondary | ICD-10-CM | POA: Diagnosis not present

## 2015-06-19 DIAGNOSIS — F0391 Unspecified dementia with behavioral disturbance: Secondary | ICD-10-CM | POA: Diagnosis not present

## 2015-06-19 DIAGNOSIS — M1990 Unspecified osteoarthritis, unspecified site: Secondary | ICD-10-CM | POA: Diagnosis not present

## 2015-06-19 NOTE — Telephone Encounter (Signed)
Received physical therapy certification and plan of care via fax from [employer]. Forwarded to Dr. Etter Sjogren. JG//CMA

## 2015-06-20 DIAGNOSIS — Z9181 History of falling: Secondary | ICD-10-CM | POA: Diagnosis not present

## 2015-06-20 DIAGNOSIS — F0391 Unspecified dementia with behavioral disturbance: Secondary | ICD-10-CM | POA: Diagnosis not present

## 2015-06-20 DIAGNOSIS — I10 Essential (primary) hypertension: Secondary | ICD-10-CM | POA: Diagnosis not present

## 2015-06-20 DIAGNOSIS — M1990 Unspecified osteoarthritis, unspecified site: Secondary | ICD-10-CM | POA: Diagnosis not present

## 2015-06-20 DIAGNOSIS — E46 Unspecified protein-calorie malnutrition: Secondary | ICD-10-CM | POA: Diagnosis not present

## 2015-06-20 DIAGNOSIS — Z8673 Personal history of transient ischemic attack (TIA), and cerebral infarction without residual deficits: Secondary | ICD-10-CM | POA: Diagnosis not present

## 2015-06-23 DIAGNOSIS — I10 Essential (primary) hypertension: Secondary | ICD-10-CM | POA: Diagnosis not present

## 2015-06-23 DIAGNOSIS — F0391 Unspecified dementia with behavioral disturbance: Secondary | ICD-10-CM | POA: Diagnosis not present

## 2015-06-23 DIAGNOSIS — M1990 Unspecified osteoarthritis, unspecified site: Secondary | ICD-10-CM | POA: Diagnosis not present

## 2015-06-23 DIAGNOSIS — E46 Unspecified protein-calorie malnutrition: Secondary | ICD-10-CM | POA: Diagnosis not present

## 2015-06-23 DIAGNOSIS — Z9181 History of falling: Secondary | ICD-10-CM | POA: Diagnosis not present

## 2015-06-23 DIAGNOSIS — Z8673 Personal history of transient ischemic attack (TIA), and cerebral infarction without residual deficits: Secondary | ICD-10-CM | POA: Diagnosis not present

## 2015-06-24 DIAGNOSIS — E46 Unspecified protein-calorie malnutrition: Secondary | ICD-10-CM | POA: Diagnosis not present

## 2015-06-24 DIAGNOSIS — Z8673 Personal history of transient ischemic attack (TIA), and cerebral infarction without residual deficits: Secondary | ICD-10-CM | POA: Diagnosis not present

## 2015-06-24 DIAGNOSIS — I10 Essential (primary) hypertension: Secondary | ICD-10-CM | POA: Diagnosis not present

## 2015-06-24 DIAGNOSIS — Z9181 History of falling: Secondary | ICD-10-CM | POA: Diagnosis not present

## 2015-06-24 DIAGNOSIS — F0391 Unspecified dementia with behavioral disturbance: Secondary | ICD-10-CM | POA: Diagnosis not present

## 2015-06-24 DIAGNOSIS — M1990 Unspecified osteoarthritis, unspecified site: Secondary | ICD-10-CM | POA: Diagnosis not present

## 2015-06-25 ENCOUNTER — Other Ambulatory Visit: Payer: Self-pay | Admitting: Family Medicine

## 2015-06-26 ENCOUNTER — Telehealth: Payer: Self-pay | Admitting: Family Medicine

## 2015-06-26 DIAGNOSIS — E46 Unspecified protein-calorie malnutrition: Secondary | ICD-10-CM | POA: Diagnosis not present

## 2015-06-26 DIAGNOSIS — Z9181 History of falling: Secondary | ICD-10-CM | POA: Diagnosis not present

## 2015-06-26 DIAGNOSIS — Z8673 Personal history of transient ischemic attack (TIA), and cerebral infarction without residual deficits: Secondary | ICD-10-CM | POA: Diagnosis not present

## 2015-06-26 DIAGNOSIS — M1990 Unspecified osteoarthritis, unspecified site: Secondary | ICD-10-CM | POA: Diagnosis not present

## 2015-06-26 DIAGNOSIS — F0391 Unspecified dementia with behavioral disturbance: Secondary | ICD-10-CM | POA: Diagnosis not present

## 2015-06-26 DIAGNOSIS — I10 Essential (primary) hypertension: Secondary | ICD-10-CM | POA: Diagnosis not present

## 2015-06-26 NOTE — Telephone Encounter (Signed)
Verbal given to [place].      KP

## 2015-06-26 NOTE — Telephone Encounter (Signed)
Caller name: Hoyle Sauer  Relation to pt: RN [facility]  Call back number: [phone]    Reason for call:  Requesting verbal orders for 3 more nurse visit 1x for 3 week. Patient is not eating.

## 2015-06-30 DIAGNOSIS — Z9181 History of falling: Secondary | ICD-10-CM | POA: Diagnosis not present

## 2015-06-30 DIAGNOSIS — M1990 Unspecified osteoarthritis, unspecified site: Secondary | ICD-10-CM | POA: Diagnosis not present

## 2015-06-30 DIAGNOSIS — I10 Essential (primary) hypertension: Secondary | ICD-10-CM | POA: Diagnosis not present

## 2015-06-30 DIAGNOSIS — F0391 Unspecified dementia with behavioral disturbance: Secondary | ICD-10-CM | POA: Diagnosis not present

## 2015-06-30 DIAGNOSIS — Z8673 Personal history of transient ischemic attack (TIA), and cerebral infarction without residual deficits: Secondary | ICD-10-CM | POA: Diagnosis not present

## 2015-06-30 DIAGNOSIS — E46 Unspecified protein-calorie malnutrition: Secondary | ICD-10-CM | POA: Diagnosis not present

## 2015-07-01 ENCOUNTER — Telehealth: Payer: Self-pay | Admitting: *Deleted

## 2015-07-01 DIAGNOSIS — F0391 Unspecified dementia with behavioral disturbance: Secondary | ICD-10-CM | POA: Diagnosis not present

## 2015-07-01 DIAGNOSIS — I10 Essential (primary) hypertension: Secondary | ICD-10-CM | POA: Diagnosis not present

## 2015-07-01 DIAGNOSIS — Z9181 History of falling: Secondary | ICD-10-CM | POA: Diagnosis not present

## 2015-07-01 DIAGNOSIS — M1990 Unspecified osteoarthritis, unspecified site: Secondary | ICD-10-CM | POA: Diagnosis not present

## 2015-07-01 DIAGNOSIS — Z8673 Personal history of transient ischemic attack (TIA), and cerebral infarction without residual deficits: Secondary | ICD-10-CM | POA: Diagnosis not present

## 2015-07-01 DIAGNOSIS — E46 Unspecified protein-calorie malnutrition: Secondary | ICD-10-CM | POA: Diagnosis not present

## 2015-07-01 NOTE — Telephone Encounter (Signed)
Received home health certification and plan of care via fax from [facility]. Forwarded to Dr. Lowne. JG//CMA 

## 2015-07-02 NOTE — Telephone Encounter (Signed)
Faxed successfully to [place]. Sent for scanning. JG//CMA

## 2015-07-03 DIAGNOSIS — F0391 Unspecified dementia with behavioral disturbance: Secondary | ICD-10-CM | POA: Diagnosis not present

## 2015-07-03 DIAGNOSIS — Z9181 History of falling: Secondary | ICD-10-CM | POA: Diagnosis not present

## 2015-07-03 DIAGNOSIS — Z8673 Personal history of transient ischemic attack (TIA), and cerebral infarction without residual deficits: Secondary | ICD-10-CM | POA: Diagnosis not present

## 2015-07-03 DIAGNOSIS — I10 Essential (primary) hypertension: Secondary | ICD-10-CM | POA: Diagnosis not present

## 2015-07-03 DIAGNOSIS — M1990 Unspecified osteoarthritis, unspecified site: Secondary | ICD-10-CM | POA: Diagnosis not present

## 2015-07-03 DIAGNOSIS — E46 Unspecified protein-calorie malnutrition: Secondary | ICD-10-CM | POA: Diagnosis not present

## 2015-07-08 DIAGNOSIS — E46 Unspecified protein-calorie malnutrition: Secondary | ICD-10-CM | POA: Diagnosis not present

## 2015-07-08 DIAGNOSIS — I10 Essential (primary) hypertension: Secondary | ICD-10-CM | POA: Diagnosis not present

## 2015-07-08 DIAGNOSIS — Z8673 Personal history of transient ischemic attack (TIA), and cerebral infarction without residual deficits: Secondary | ICD-10-CM | POA: Diagnosis not present

## 2015-07-08 DIAGNOSIS — M1990 Unspecified osteoarthritis, unspecified site: Secondary | ICD-10-CM | POA: Diagnosis not present

## 2015-07-08 DIAGNOSIS — F0391 Unspecified dementia with behavioral disturbance: Secondary | ICD-10-CM | POA: Diagnosis not present

## 2015-07-08 DIAGNOSIS — Z9181 History of falling: Secondary | ICD-10-CM | POA: Diagnosis not present

## 2015-07-09 DIAGNOSIS — Z8673 Personal history of transient ischemic attack (TIA), and cerebral infarction without residual deficits: Secondary | ICD-10-CM | POA: Diagnosis not present

## 2015-07-09 DIAGNOSIS — F0391 Unspecified dementia with behavioral disturbance: Secondary | ICD-10-CM | POA: Diagnosis not present

## 2015-07-09 DIAGNOSIS — E46 Unspecified protein-calorie malnutrition: Secondary | ICD-10-CM | POA: Diagnosis not present

## 2015-07-09 DIAGNOSIS — I10 Essential (primary) hypertension: Secondary | ICD-10-CM | POA: Diagnosis not present

## 2015-07-09 DIAGNOSIS — M1990 Unspecified osteoarthritis, unspecified site: Secondary | ICD-10-CM | POA: Diagnosis not present

## 2015-07-09 DIAGNOSIS — Z9181 History of falling: Secondary | ICD-10-CM | POA: Diagnosis not present

## 2015-07-10 DIAGNOSIS — E46 Unspecified protein-calorie malnutrition: Secondary | ICD-10-CM | POA: Diagnosis not present

## 2015-07-10 DIAGNOSIS — I10 Essential (primary) hypertension: Secondary | ICD-10-CM | POA: Diagnosis not present

## 2015-07-10 DIAGNOSIS — Z9181 History of falling: Secondary | ICD-10-CM | POA: Diagnosis not present

## 2015-07-10 DIAGNOSIS — M1990 Unspecified osteoarthritis, unspecified site: Secondary | ICD-10-CM | POA: Diagnosis not present

## 2015-07-10 DIAGNOSIS — F0391 Unspecified dementia with behavioral disturbance: Secondary | ICD-10-CM | POA: Diagnosis not present

## 2015-07-10 DIAGNOSIS — Z8673 Personal history of transient ischemic attack (TIA), and cerebral infarction without residual deficits: Secondary | ICD-10-CM | POA: Diagnosis not present

## 2015-07-11 ENCOUNTER — Other Ambulatory Visit: Payer: Self-pay | Admitting: Family Medicine

## 2015-07-11 NOTE — Telephone Encounter (Signed)
Ultracet requested.  Last seen 06/05/15 and filled 04/28/15 #30 with 1 refill UDS 07/13/14 done at the hospital.  Please advise    KP

## 2015-07-14 DIAGNOSIS — I10 Essential (primary) hypertension: Secondary | ICD-10-CM | POA: Diagnosis not present

## 2015-07-14 DIAGNOSIS — Z9181 History of falling: Secondary | ICD-10-CM | POA: Diagnosis not present

## 2015-07-14 DIAGNOSIS — E46 Unspecified protein-calorie malnutrition: Secondary | ICD-10-CM | POA: Diagnosis not present

## 2015-07-14 DIAGNOSIS — M1990 Unspecified osteoarthritis, unspecified site: Secondary | ICD-10-CM | POA: Diagnosis not present

## 2015-07-14 DIAGNOSIS — Z8673 Personal history of transient ischemic attack (TIA), and cerebral infarction without residual deficits: Secondary | ICD-10-CM | POA: Diagnosis not present

## 2015-07-14 DIAGNOSIS — F0391 Unspecified dementia with behavioral disturbance: Secondary | ICD-10-CM | POA: Diagnosis not present

## 2015-07-15 DIAGNOSIS — Z9181 History of falling: Secondary | ICD-10-CM | POA: Diagnosis not present

## 2015-07-15 DIAGNOSIS — F0391 Unspecified dementia with behavioral disturbance: Secondary | ICD-10-CM | POA: Diagnosis not present

## 2015-07-15 DIAGNOSIS — M1990 Unspecified osteoarthritis, unspecified site: Secondary | ICD-10-CM | POA: Diagnosis not present

## 2015-07-15 DIAGNOSIS — Z8673 Personal history of transient ischemic attack (TIA), and cerebral infarction without residual deficits: Secondary | ICD-10-CM | POA: Diagnosis not present

## 2015-07-15 DIAGNOSIS — E46 Unspecified protein-calorie malnutrition: Secondary | ICD-10-CM | POA: Diagnosis not present

## 2015-07-15 DIAGNOSIS — I10 Essential (primary) hypertension: Secondary | ICD-10-CM | POA: Diagnosis not present

## 2015-07-17 DIAGNOSIS — E46 Unspecified protein-calorie malnutrition: Secondary | ICD-10-CM | POA: Diagnosis not present

## 2015-07-17 DIAGNOSIS — Z9181 History of falling: Secondary | ICD-10-CM | POA: Diagnosis not present

## 2015-07-17 DIAGNOSIS — F0391 Unspecified dementia with behavioral disturbance: Secondary | ICD-10-CM | POA: Diagnosis not present

## 2015-07-17 DIAGNOSIS — I10 Essential (primary) hypertension: Secondary | ICD-10-CM | POA: Diagnosis not present

## 2015-07-17 DIAGNOSIS — Z8673 Personal history of transient ischemic attack (TIA), and cerebral infarction without residual deficits: Secondary | ICD-10-CM | POA: Diagnosis not present

## 2015-07-17 DIAGNOSIS — M1990 Unspecified osteoarthritis, unspecified site: Secondary | ICD-10-CM | POA: Diagnosis not present

## 2015-07-22 DIAGNOSIS — Z8673 Personal history of transient ischemic attack (TIA), and cerebral infarction without residual deficits: Secondary | ICD-10-CM | POA: Diagnosis not present

## 2015-07-22 DIAGNOSIS — I10 Essential (primary) hypertension: Secondary | ICD-10-CM | POA: Diagnosis not present

## 2015-07-22 DIAGNOSIS — E46 Unspecified protein-calorie malnutrition: Secondary | ICD-10-CM | POA: Diagnosis not present

## 2015-07-22 DIAGNOSIS — M1990 Unspecified osteoarthritis, unspecified site: Secondary | ICD-10-CM | POA: Diagnosis not present

## 2015-07-22 DIAGNOSIS — F0391 Unspecified dementia with behavioral disturbance: Secondary | ICD-10-CM | POA: Diagnosis not present

## 2015-07-22 DIAGNOSIS — Z9181 History of falling: Secondary | ICD-10-CM | POA: Diagnosis not present

## 2015-08-08 ENCOUNTER — Emergency Department (HOSPITAL_COMMUNITY): Payer: Medicare Other

## 2015-08-08 ENCOUNTER — Encounter (HOSPITAL_COMMUNITY): Payer: Self-pay | Admitting: Emergency Medicine

## 2015-08-08 ENCOUNTER — Observation Stay (HOSPITAL_COMMUNITY)
Admission: EM | Admit: 2015-08-08 | Discharge: 2015-08-09 | Disposition: A | Discharge status: Nursing Home (Includes ICF) | Payer: Medicare Other | Attending: Internal Medicine | Admitting: Internal Medicine

## 2015-08-08 DIAGNOSIS — Z7902 Long term (current) use of antithrombotics/antiplatelets: Secondary | ICD-10-CM | POA: Diagnosis not present

## 2015-08-08 DIAGNOSIS — Z79899 Other long term (current) drug therapy: Secondary | ICD-10-CM | POA: Diagnosis not present

## 2015-08-08 DIAGNOSIS — R001 Bradycardia, unspecified: Secondary | ICD-10-CM | POA: Insufficient documentation

## 2015-08-08 DIAGNOSIS — I10 Essential (primary) hypertension: Secondary | ICD-10-CM | POA: Diagnosis not present

## 2015-08-08 DIAGNOSIS — R55 Syncope and collapse: Principal | ICD-10-CM | POA: Insufficient documentation

## 2015-08-08 DIAGNOSIS — F0390 Unspecified dementia without behavioral disturbance: Secondary | ICD-10-CM | POA: Diagnosis not present

## 2015-08-08 DIAGNOSIS — N390 Urinary tract infection, site not specified: Secondary | ICD-10-CM | POA: Insufficient documentation

## 2015-08-08 DIAGNOSIS — E785 Hyperlipidemia, unspecified: Secondary | ICD-10-CM | POA: Diagnosis not present

## 2015-08-08 DIAGNOSIS — R2681 Unsteadiness on feet: Secondary | ICD-10-CM | POA: Insufficient documentation

## 2015-08-08 DIAGNOSIS — R5381 Other malaise: Secondary | ICD-10-CM | POA: Diagnosis not present

## 2015-08-08 DIAGNOSIS — R404 Transient alteration of awareness: Secondary | ICD-10-CM | POA: Diagnosis not present

## 2015-08-08 DIAGNOSIS — Z8542 Personal history of malignant neoplasm of other parts of uterus: Secondary | ICD-10-CM | POA: Insufficient documentation

## 2015-08-08 DIAGNOSIS — R5383 Other fatigue: Secondary | ICD-10-CM

## 2015-08-08 DIAGNOSIS — Z8673 Personal history of transient ischemic attack (TIA), and cerebral infarction without residual deficits: Secondary | ICD-10-CM | POA: Diagnosis not present

## 2015-08-08 DIAGNOSIS — R410 Disorientation, unspecified: Secondary | ICD-10-CM | POA: Diagnosis not present

## 2015-08-08 HISTORY — DX: Major depressive disorder, single episode, unspecified: F32.9

## 2015-08-08 HISTORY — DX: Depression, unspecified: F32.A

## 2015-08-08 HISTORY — DX: Unspecified malignant neoplasm of skin, unspecified: C44.90

## 2015-08-08 HISTORY — DX: Inflammatory liver disease, unspecified: K75.9

## 2015-08-08 LAB — I-STAT CG4 LACTIC ACID, ED: Lactic Acid, Venous: 1.91 mmol/L (ref 0.5–2.0)

## 2015-08-08 LAB — URINALYSIS, ROUTINE W REFLEX MICROSCOPIC
BILIRUBIN URINE: NEGATIVE
GLUCOSE, UA: NEGATIVE mg/dL
HGB URINE DIPSTICK: NEGATIVE
Ketones, ur: 15 mg/dL — AB
Nitrite: NEGATIVE
Protein, ur: NEGATIVE mg/dL
SPECIFIC GRAVITY, URINE: 1.02 (ref 1.005–1.030)
UROBILINOGEN UA: 1 mg/dL (ref 0.0–1.0)
pH: 5 (ref 5.0–8.0)

## 2015-08-08 LAB — COMPREHENSIVE METABOLIC PANEL
ALK PHOS: 41 U/L (ref 38–126)
ALT: 12 U/L — AB (ref 14–54)
AST: 19 U/L (ref 15–41)
Albumin: 3.7 g/dL (ref 3.5–5.0)
Anion gap: 8 (ref 5–15)
BILIRUBIN TOTAL: 1.4 mg/dL — AB (ref 0.3–1.2)
BUN: 12 mg/dL (ref 6–20)
CALCIUM: 9.3 mg/dL (ref 8.9–10.3)
CO2: 29 mmol/L (ref 22–32)
CREATININE: 0.91 mg/dL (ref 0.44–1.00)
Chloride: 104 mmol/L (ref 101–111)
GFR, EST NON AFRICAN AMERICAN: 57 mL/min — AB (ref >60)
Glucose, Bld: 102 mg/dL — ABNORMAL HIGH (ref 65–99)
Potassium: 4.3 mmol/L (ref 3.5–5.1)
Sodium: 141 mmol/L (ref 135–145)
Total Protein: 5.9 g/dL — ABNORMAL LOW (ref 6.5–8.1)

## 2015-08-08 LAB — CBC WITH DIFFERENTIAL/PLATELET
BASOS ABS: 0 10*3/uL (ref 0.0–0.1)
BASOS PCT: 0 %
EOS PCT: 1 %
Eosinophils Absolute: 0.1 10*3/uL (ref 0.0–0.7)
HCT: 41.8 % (ref 36.0–46.0)
Hemoglobin: 14 g/dL (ref 12.0–15.0)
Lymphocytes Relative: 21 %
Lymphs Abs: 1.5 10*3/uL (ref 0.7–4.0)
MCH: 32.1 pg (ref 26.0–34.0)
MCHC: 33.5 g/dL (ref 30.0–36.0)
MCV: 95.9 fL (ref 78.0–100.0)
MONO ABS: 0.5 10*3/uL (ref 0.1–1.0)
Monocytes Relative: 7 %
Neutro Abs: 5 10*3/uL (ref 1.7–7.7)
Neutrophils Relative %: 71 %
PLATELETS: 199 10*3/uL (ref 150–400)
RBC: 4.36 MIL/uL (ref 3.87–5.11)
RDW: 13.7 % (ref 11.5–15.5)
WBC: 7.1 10*3/uL (ref 4.0–10.5)

## 2015-08-08 LAB — MRSA PCR SCREENING: MRSA by PCR: NEGATIVE

## 2015-08-08 LAB — URINE MICROSCOPIC-ADD ON

## 2015-08-08 LAB — LIPASE, BLOOD: LIPASE: 35 U/L (ref 22–51)

## 2015-08-08 LAB — TROPONIN I

## 2015-08-08 MED ORDER — SODIUM CHLORIDE 0.9 % IV SOLN: INTRAVENOUS | Status: DC

## 2015-08-08 MED ORDER — TRAMADOL-ACETAMINOPHEN 37.5-325 MG PO TABS
1.0000 | ORAL_TABLET | Freq: Four times a day (QID) | ORAL | Status: DC | PRN
  Administered 2015-08-08: 1 via ORAL
  Filled 2015-08-08: qty 1

## 2015-08-08 MED ORDER — ONDANSETRON HCL 4 MG/2ML IJ SOLN: 4.0000 mg | Freq: Four times a day (QID) | INTRAMUSCULAR | Status: DC | PRN

## 2015-08-08 MED ORDER — HYDRALAZINE HCL 10 MG PO TABS: 10.0000 mg | ORAL_TABLET | Freq: Four times a day (QID) | ORAL | Status: DC | PRN

## 2015-08-08 MED ORDER — SODIUM CHLORIDE 0.9 % IV BOLUS (SEPSIS)
500.0000 mL | Freq: Once | INTRAVENOUS | Status: AC
  Administered 2015-08-08: 500 mL via INTRAVENOUS

## 2015-08-08 MED ORDER — CEFTRIAXONE SODIUM 1 G IJ SOLR
1.0000 g | INTRAMUSCULAR | Status: DC
  Administered 2015-08-09: 1 g via INTRAVENOUS
  Filled 2015-08-08 (×2): qty 10

## 2015-08-08 MED ORDER — ENOXAPARIN SODIUM 40 MG/0.4ML ~~LOC~~ SOLN
40.0000 mg | SUBCUTANEOUS | Status: DC
  Filled 2015-08-08: qty 0.4

## 2015-08-08 MED ORDER — DONEPEZIL HCL 23 MG PO TABS
23.0000 mg | ORAL_TABLET | Freq: Every day | ORAL | Status: DC
  Administered 2015-08-08: 23 mg via ORAL
  Filled 2015-08-08 (×2): qty 1

## 2015-08-08 MED ORDER — ONDANSETRON HCL 4 MG PO TABS
4.0000 mg | ORAL_TABLET | Freq: Four times a day (QID) | ORAL | Status: DC | PRN
Start: 2015-08-08 — End: 2015-08-09

## 2015-08-08 MED ORDER — SODIUM CHLORIDE 0.9 % IV SOLN
INTRAVENOUS | Status: DC
  Administered 2015-08-08 – 2015-08-09 (×3): via INTRAVENOUS

## 2015-08-08 MED ORDER — ATORVASTATIN CALCIUM 10 MG PO TABS
10.0000 mg | ORAL_TABLET | Freq: Every day | ORAL | Status: DC
  Administered 2015-08-08: 10 mg via ORAL
  Filled 2015-08-08: qty 1

## 2015-08-08 MED ORDER — LEVOFLOXACIN IN D5W 500 MG/100ML IV SOLN
500.0000 mg | Freq: Once | INTRAVENOUS | Status: AC
  Administered 2015-08-08: 500 mg via INTRAVENOUS
  Filled 2015-08-08: qty 100

## 2015-08-08 MED ORDER — CLOPIDOGREL BISULFATE 75 MG PO TABS
75.0000 mg | ORAL_TABLET | Freq: Every day | ORAL | Status: DC
  Administered 2015-08-08 – 2015-08-09 (×2): 75 mg via ORAL
  Filled 2015-08-08 (×2): qty 1

## 2015-08-08 NOTE — ED Provider Notes (Signed)
CSN: [id number]     Arrival date & time 08/08/15  0840 History   First MD Initiated Contact with Patient 08/08/15 [phone]     Chief Complaint  Patient presents with  . Near Syncope     (Consider location/radiation/quality/duration/timing/severity/associated sxs/prior Treatment) HPI Patient states she has not felt well for approximately 2 days. She reports nausea without vomiting. She reports one bowel movement per day with no diarrhea. She does endorse some constant lower abdominal discomfort. She has not noticed pain burning or urgency with urination. She reports she has continued to eat. This morning she was walking with a caregiver and became lightheaded and became close to passing out. No associated injury and no apparent complete LOC. Past Medical History  Diagnosis Date  . Arthritis   . Hypertension   . Hyperlipidemia   . Insomnia   . TIA (transient ischemic attack)   . Uterine cancer   . Dementia   . Stroke 2015   Past Surgical History  Procedure Laterality Date  . No surgical history  03/2012   Family History  Problem Relation Age of Onset  . Stroke Father    Social History  Substance Use Topics  . Smoking status: Never Smoker   . Smokeless tobacco: Never Used  . Alcohol Use: No   OB History    No data available     Review of Systems  10 Systems reviewed and are negative for acute change except as noted in the HPI.   Allergies  Review of patient's allergies indicates no known allergies.  Home Medications   Prior to Admission medications   Medication Sig Start Date End Date Taking? Authorizing Provider  atorvastatin (LIPITOR) 10 MG tablet Take 1 tablet (10 mg total) by mouth daily at 6 PM. 05/29/15  Yes Alferd Apa Lowne, DO  calcium gluconate 500 MG tablet Take 500 mg by mouth daily.   Yes Historical Provider, MD  clopidogrel (PLAVIX) 75 MG tablet TAKE 1 TABLET BY MOUTH EVERY DAY 06/25/15  Yes Yvonne R Lowne, DO  donepezil (ARICEPT) 23 MG TABS tablet Take 1  tablet (23 mg total) by mouth at bedtime. 06/25/15  Yes Yvonne R Lowne, DO  lisinopril (PRINIVIL,ZESTRIL) 10 MG tablet Take 10 mg by mouth daily.   Yes Historical Provider, MD  traMADol-acetaminophen (ULTRACET) 37.5-325 MG per tablet TAKE 1 TABLET BY MOUTH EVERY 6 HOURS AS NEEDED 07/14/15  Yes Yvonne R Lowne, DO  lisinopril (PRINIVIL,ZESTRIL) 20 MG tablet Take 1 tablet (20 mg total) by mouth daily. 07/16/14   Eugenie Filler, MD   BP 150/60 mmHg  Pulse 57  Temp(Src) 97.6 F (36.4 C) (Oral)  Resp 17  SpO2 96% Physical Exam  Constitutional: She is oriented to person, place, and time. She appears well-developed and well-nourished.  HENT:  Head: Normocephalic and atraumatic.  Eyes: EOM are normal. Pupils are equal, round, and reactive to light.  Neck: Neck supple.  Cardiovascular: Normal rate, regular rhythm, normal heart sounds and intact distal pulses.   Pulmonary/Chest: Effort normal and breath sounds normal.  Abdominal: Soft. Bowel sounds are normal. She exhibits no distension. There is no tenderness.  Patient does not endorse tenderness to palpation. There is no areas of guarding.  Musculoskeletal: Normal range of motion. She exhibits no edema.  Neurological: She is alert and oriented to person, place, and time. She has normal strength. Coordination normal. GCS eye subscore is 4. GCS verbal subscore is 5. GCS motor subscore is 6.  Skin: Skin is warm,  dry and intact.  Psychiatric: She has a normal mood and affect.    ED Course  Procedures (including critical care time) Labs Review Labs Reviewed  COMPREHENSIVE METABOLIC PANEL - Abnormal; Notable for the following:    Glucose, Bld 102 (*)    Total Protein 5.9 (*)    ALT 12 (*)    Total Bilirubin 1.4 (*)    GFR calc non Af Amer 57 (*)    All other components within normal limits  URINALYSIS, ROUTINE W REFLEX MICROSCOPIC (NOT AT [facility]) - Abnormal; Notable for the following:    APPearance CLOUDY (*)    Ketones, ur 15 (*)     Leukocytes, UA MODERATE (*)    All other components within normal limits  URINE MICROSCOPIC-ADD ON - Abnormal; Notable for the following:    Bacteria, UA MANY (*)    All other components within normal limits  URINE CULTURE  LIPASE, BLOOD  TROPONIN I  CBC WITH DIFFERENTIAL/PLATELET  I-STAT CG4 LACTIC ACID, ED    Imaging Review Dg Chest 2 View  08/08/2015   CLINICAL DATA:  Confusion.  Near syncope.  EXAM: CHEST  2 VIEW  COMPARISON:  07/13/2014.  FINDINGS: Cardiac silhouette is normal in size and configuration. Normal mediastinal and hilar contours.  Lungs are mildly hyperexpanded. There is mild interstitial thickening bilaterally similar to the prior study. No lung consolidation or edema. No pleural effusion or pneumothorax.  Bony thorax is demineralized but grossly intact.  IMPRESSION: No acute cardiopulmonary disease.   Electronically Signed   By: Lajean Manes M.D.   On: 08/08/2015 10:07   I have personally reviewed and evaluated these images and lab results as part of my medical decision-making.   EKG Interpretation   Date/Time:  Friday August 08 2015 08:43:05 EDT Ventricular Rate:  59 PR Interval:  153 QRS Duration: 82 QT Interval:  434 QTC Calculation: 430 R Axis:   73 Text Interpretation:  Sinus rhythm Consider left atrial enlargement  normal. no sig change Confirmed by Johnney Killian, MD, Jeannie Done [phone]) on  08/08/2015 9:30:36 AM      MDM   Final diagnoses:  UTI (lower urinary tract infection)  Malaise and fatigue  Near syncope   Patient is alert and interactive. She does describe general malaise and fatigue for 2 days. She also endorses some lower abdominal discomfort. Findings are positive for UTI. This is the likely etiology of her symptoms. Also today she had an episode of lightheadedness and near syncope. He did injury. At this point time the patient will be placed in observation for near-syncope with bradycardia and UTI.    Charlesetta Shanks, MD 08/08/15 1346

## 2015-08-08 NOTE — H&P (Signed)
[facility] History and Physical                                                                                    Kimberly Craig, is a 81 y.o. female  MRN: [id number]   DOB - [birth year]  Admit Date - 08/08/2015  Outpatient Primary MD for the patient is Garnet Koyanagi, DO  Referring Physician:  Dr. Johnney Killian -EDP  Chief Complaint:   Chief Complaint  Patient presents with  . Near Syncope    HPI  Kimberly Craig  is a 81 y.o. female, brought to ED today by EMS. Patient lives at [facility] and was out walking with a nurse today when she suddenly felt light headed. Patient felt like she was going to pass out but did not. Patient reported feeling nauseated but denies nausea to me ( patient has a history of dementia so some details may be unreliable). Patient's son is at bedside, he is her POA. Several months ago patient's physical condition deteriorated for unclear reasons. She began PT and condition improved considerably. Her weight has stabilized but appetite still not great.  She endorses recent onset of urinary discomfort.    In ED she is slightly bradycardic but vital signs otherwise unremarkable. CBC unremarkable. CMET unremarkable save a mildly elevated t.bili of 1.4. CXR unrevealing. EKG - NSR. U/A markedly abnormal with WBCs and bacteria.   Lactic acid normal.   Review of Systems   In addition to the HPI above,  No Fever-chills, No Headache, No changes with Vision or hearing, No problems swallowing food or Liquids, No Chest pain, Cough or Shortness of Breath, No Abdominal pain, No Nausea or Vomiting, Bowel movements are regular, No Blood in stool or Urine, No dysuria, No new skin rashes or bruises, No new joints pains-aches,  No new weakness, tingling, numbness in any extremity, No recent weight gain or loss, A full 10 point Review of Systems was done, except as stated above, all other Review of Systems were negative.  Past Medical History  Past Medical History   Diagnosis Date  . Arthritis   . Hypertension   . Hyperlipidemia   . Insomnia   . TIA (transient ischemic attack)   . Uterine cancer   . Dementia   . Stroke 2015   Past Surgical History  Procedure Laterality Date  . Appendectomy     Social History Social History  Substance Use Topics  . Smoking status: Never Smoker   . Smokeless tobacco: Never Used  . Alcohol Use: No   Resides at: [place] with: self at above Ambulatory status: uses a walker Has a son who lives locally and is POA. She has a [place].   Family History Family History  Problem Relation Age of Onset  . Stroke Father     Prior to Admission medications   Medication Sig Start Date End Date Taking? Authorizing Provider  atorvastatin (LIPITOR) 10 MG tablet Take 1 tablet (10 mg total) by mouth daily at 6 PM. 05/29/15  Yes Alferd Apa Lowne, DO  calcium gluconate 500 MG tablet Take 500 mg by mouth daily.   Yes Historical Provider,  MD  clopidogrel (PLAVIX) 75 MG tablet TAKE 1 TABLET BY MOUTH EVERY DAY 06/25/15  Yes Yvonne R Lowne, DO  donepezil (ARICEPT) 23 MG TABS tablet Take 1 tablet (23 mg total) by mouth at bedtime. 06/25/15  Yes Yvonne R Lowne, DO  lisinopril (PRINIVIL,ZESTRIL) 10 MG tablet Take 10 mg by mouth daily.   Yes Historical Provider, MD  traMADol-acetaminophen (ULTRACET) 37.5-325 MG per tablet TAKE 1 TABLET BY MOUTH EVERY 6 HOURS AS NEEDED 07/14/15  Yes Yvonne R Lowne, DO  lisinopril (PRINIVIL,ZESTRIL) 20 MG tablet Take 1 tablet (20 mg total) by mouth daily. 07/16/14   Eugenie Filler, MD    No Known Allergies  Physical Exam  Vitals  Blood pressure 150/60, pulse 57, temperature 97.6 F (36.4 C), temperature source Oral, resp. rate 17, SpO2 96 %.   General:  Pleasant white female lying in bed in NAD,   Psych:  Normal affect. Pleasant. Mildly confused about some details of recent history . Not Suicidal or Homicidal, Awake Alert,  Neuro:   No F.N deficits, ALL C.Nerves  Intact, Strength 5/5 all 4 extremities, Sensation intact all 4 extremities.  ENT:  Hard of hearing. Eyes appear Normal, Conjunctivae clear, PER. Moist oral mucosa without erythema or exudates.  Neck:  Supple, No lymphadenopathy appreciated  Respiratory:  Symmetrical chest wall movement, Good air movement bilaterally, CTAB.  Cardiac:  RRR,  no LE edema noted, no JVD.    Abdomen:  Positive bowel sounds, Soft, Non tender, Non distended,  No masses appreciated  Skin:  No Cyanosis, Normal Skin Turgor, No Skin Rash or Bruise.  Extremities:  Able to move all 4. 5/5 strength in each,  no effusions.  Data Review  CBC  Recent Labs Lab 08/08/15 0948  WBC 7.1  HGB 14.0  HCT 41.8  PLT 199  MCV 95.9  MCH 32.1  MCHC 33.5  RDW 13.7  LYMPHSABS 1.5  MONOABS 0.5  EOSABS 0.1  BASOSABS 0.0    Chemistries   Recent Labs Lab 08/08/15 0948  NA 141  K 4.3  CL 104  CO2 29  GLUCOSE 102*  BUN 12  CREATININE 0.91  CALCIUM 9.3  AST 19  ALT 12*  ALKPHOS 41  BILITOT 1.4*    Cardiac Enzymes  Recent Labs Lab 08/08/15 0948  TROPONINI <0.03    Urinalysis    Component Value Date/Time   COLORURINE YELLOW 08/08/2015 1201   APPEARANCEUR CLOUDY* 08/08/2015 1201   LABSPEC 1.020 08/08/2015 1201   PHURINE 5.0 08/08/2015 1201   GLUCOSEU NEGATIVE 08/08/2015 1201   GLUCOSEU NEGATIVE 02/10/2012 0852   HGBUR NEGATIVE 08/08/2015 1201   BILIRUBINUR NEGATIVE 08/08/2015 1201   KETONESUR 15* 08/08/2015 1201   PROTEINUR NEGATIVE 08/08/2015 1201   UROBILINOGEN 1.0 08/08/2015 1201   NITRITE NEGATIVE 08/08/2015 1201   LEUKOCYTESUR MODERATE* 08/08/2015 1201    My personal review of EKG: NSR   Assessment & Plan  Principal Problem:   UTI (lower urinary tract infection) Active Problems:   Dementia   Bradycardia   History of TIA (transient ischemic attack)  UTI. U/A with TNTC WBC, many bacteria, neg. Nitrites. Endorses mild dysuria. Culture ordered. Will change to Rocephin pending  culture results.   Dementia. Son is POA, he is at bedside. Continue Aricept  Bradycardia. Heart rate in 50's. Not on beta blockers. Bradycardia possibly contributing to lightheadedness / feeling of syncope  History of TIA. Followed outpatient by Neurology. Continue Plavix   Consultants Called:  none  Family Communication:  Code Status:  DNI. Discussed with patient and son (POA)  Condition:  Guarded  Potential Disposition:   Time spent in minutes : [place], NP on 08/08/2015 at 1:21 PM Between 7am to 7pm - Pager - [phone] After 7pm go to [email or web address] - password [email or web address] And look for the night coverage person covering me after hours [facility]  I have personally examined this patient and reviewed the entire database. I have reviewed the above note, made any necessary editorial changes, and agree with its content.  In brief this is an 81 year old female with dementia who lives in a supportive environment who suffered a presyncopal spell today.  Evaluation in the emergency room has revealed that she is suffering with a UTI and is also somewhat bradycardic.  Her sinus bradycardia dips as low as 50 but not lower during her ER stay.  Physical exam is as noted above.  She is being admitted to the acute units for telemetry monitoring and treatment of her UTI.  Cherene Altes, MD [facility]

## 2015-08-08 NOTE — ED Notes (Signed)
Per EMS, pt coming in for near syncope from [facility] facility. Pt was walking with her care giver when she became light headed and had a near syncopal episode. Pt did not have any LOC. No injury reported. Pt has hx of dementia and is at baseline per her care giver. Pt also reported nausea. EMS gave 4mg  of zofran which helped her nausea. NAD at this time.

## 2015-08-09 DIAGNOSIS — R001 Bradycardia, unspecified: Secondary | ICD-10-CM | POA: Diagnosis not present

## 2015-08-09 DIAGNOSIS — F0390 Unspecified dementia without behavioral disturbance: Secondary | ICD-10-CM

## 2015-08-09 DIAGNOSIS — R55 Syncope and collapse: Secondary | ICD-10-CM

## 2015-08-09 DIAGNOSIS — Z8673 Personal history of transient ischemic attack (TIA), and cerebral infarction without residual deficits: Secondary | ICD-10-CM

## 2015-08-09 DIAGNOSIS — N390 Urinary tract infection, site not specified: Secondary | ICD-10-CM | POA: Diagnosis not present

## 2015-08-09 MED ORDER — CEPHALEXIN 500 MG PO CAPS: 500.0000 mg | ORAL_CAPSULE | Freq: Two times a day (BID) | ORAL | Status: AC

## 2015-08-09 NOTE — Progress Notes (Signed)
[facility] PROGRESS NOTE  Kimberly Craig ZJQ:[id number] DOB: 01-May-1930 DOA: 08/08/2015 PCP: Garnet Koyanagi, DO  Brief narrative 81 year old female resident of an assisted-living with history of TIA/ stroke, dementia, hypertension, hyperlipidemia who was walking with her nurse when she suddenly felt lightheaded and felt like she was going to pass out. Also reportedly had nausea. In the ED patient was hemodynamically stable except for heart rate in the 50s. UA was positive for UTI and patient was admitted for further management.  Assessment/Plan: UTI Continue empiric Rocephin. Denies any dysuria. Follow cultures.  Dementia I think She has moderate-to-severe dementia which does not seem to have worsened with current infection. Continue Aricept.  Sinus bradycardia Heart rate currently stable. Unclear if this was contributing to her lightheadedness or near syncopal symptoms. Aricept can cause bradycardia and if this persist may need to be discontinued or changed. PT evaluation.  History of TIA/stroke Continue Plavix and statin  Diet: Regular   DVT prophylaxis: SCDs   Code Status: DO NOT INTUBATE Family Communication: None at bedside Disposition Plan: Return to assist living possibly on 9/25   Consultants:  None  Procedures:  None  Antibiotics:  IV Rocephin since 9/23  HPI/Subjective: Patient seen and examined. Denies any symptoms. History limited due to her dementia.  Objective: Filed Vitals:   08/09/15 0642  BP: 179/79  Pulse: 67  Temp: 98 F (36.7 C)  Resp: 16    Intake/Output Summary (Last 24 hours) at 08/09/15 1016 Last data filed at 08/09/15 0900  Gross per 24 hour  Intake    620 ml  Output    100 ml  Net    520 ml   Filed Weights   08/08/15 1555  Weight: 63.821 kg (140 lb 11.2 oz)    Exam:   General:  Elderly female in no acute distress  HEENT: No pallor, moist oral mucosa, supple neck  Chest: Clear to auscultation bilaterally  CVS:  Normal S1 and S2, no murmurs rub or gallop  GI: Soft, nondistended, nontender, bowel sounds present  Musculoskeletal: Warm, edema  CNS: Alert and oriented 1  Data Reviewed: Basic Metabolic Panel:  Recent Labs Lab 08/08/15 0948  NA 141  K 4.3  CL 104  CO2 29  GLUCOSE 102*  BUN 12  CREATININE 0.91  CALCIUM 9.3   Liver Function Tests:  Recent Labs Lab 08/08/15 0948  AST 19  ALT 12*  ALKPHOS 41  BILITOT 1.4*  PROT 5.9*  ALBUMIN 3.7    Recent Labs Lab 08/08/15 0948  LIPASE 35   No results for input(s): AMMONIA in the last 168 hours. CBC:  Recent Labs Lab 08/08/15 0948  WBC 7.1  NEUTROABS 5.0  HGB 14.0  HCT 41.8  MCV 95.9  PLT 199   Cardiac Enzymes:  Recent Labs Lab 08/08/15 0948  TROPONINI <0.03   BNP (last 3 results) No results for input(s): BNP in the last 8760 hours.  ProBNP (last 3 results) No results for input(s): PROBNP in the last 8760 hours.  CBG: No results for input(s): GLUCAP in the last 168 hours.  Recent Results (from the past 240 hour(s))  MRSA PCR Screening     Status: None   Collection Time: 08/08/15  3:59 PM  Result Value Ref Range Status   MRSA by PCR NEGATIVE NEGATIVE Final    Comment:        The GeneXpert MRSA Assay (FDA approved for NASAL specimens only), is one component of a comprehensive MRSA colonization surveillance program.  It is not intended to diagnose MRSA infection nor to guide or monitor treatment for MRSA infections.      Studies: Dg Chest 2 View  08/08/2015   CLINICAL DATA:  Confusion.  Near syncope.  EXAM: CHEST  2 VIEW  COMPARISON:  07/13/2014.  FINDINGS: Cardiac silhouette is normal in size and configuration. Normal mediastinal and hilar contours.  Lungs are mildly hyperexpanded. There is mild interstitial thickening bilaterally similar to the prior study. No lung consolidation or edema. No pleural effusion or pneumothorax.  Bony thorax is demineralized but grossly intact.  IMPRESSION: No acute  cardiopulmonary disease.   Electronically Signed   By: Lajean Manes M.D.   On: 08/08/2015 10:07    Scheduled Meds: . atorvastatin  10 mg Oral q1800  . cefTRIAXone (ROCEPHIN)  IV  1 g Intravenous Q24H  . clopidogrel  75 mg Oral Daily  . donepezil  23 mg Oral QHS  . enoxaparin (LOVENOX) injection  40 mg Subcutaneous Q24H   Continuous Infusions: . sodium chloride 100 mL/hr at 08/09/15 0943      Time spent: 25 minutes    DHUNGEL, NISHANT  [facility] Pager [phone]. If 7PM-7AM, please contact night-coverage at [email or web address], password [email or web address] 08/09/2015, 10:16 AM

## 2015-08-09 NOTE — Discharge Summary (Signed)
Physician Discharge Summary  Kimberly Craig MWN:[id number] DOB: 1930-11-10 DOA: 08/08/2015  PCP: Garnet Koyanagi, DO  Admit date: 08/08/2015 Discharge date: 08/09/2015  Time spent: 25 minutes  Recommendations for Outpatient Follow-up:  #1 Discharged back to assist living with home health PT/OT and RN. Patient's son refused sending patient to skilled nursing facility. #2 Patient will be discharged on oral Keflex 500 mg twice a day for 5 more days (total 7 day course). Follow urine culture results as outpatient. #3 recommend 24-hour supervision at the assisted-living.  Discharge Diagnoses:  Principal Problem:   UTI (lower urinary tract infection)  Active Problems:   Dementia   Bradycardia   History of TIA (transient ischemic attack)   Discharge Condition: Fair  Diet recommendation: Regular  Filed Weights   08/08/15 1555  Weight: 63.821 kg (140 lb 11.2 oz)    History of present illness:  81 year old female resident of an assisted-living with history of TIA/ stroke, dementia, hypertension, hyperlipidemia who was walking with her nurse when she suddenly felt lightheaded and felt like she was going to pass out. Also reportedly had nausea. In the ED patient was hemodynamically stable except for heart rate in the 50s. UA was positive for UTI and patient was admitted for further management.  Hospital Course:  UTI Continue empiric Rocephin. Denies any dysuria. Patient and her son insisted on being discharged back to assist living. Will discharge her on oral Keflex 500 mg twice a day for 5 more days to complete 7 day course of antibiotics. Follow cultures as outpatient.  Near syncopal symptoms Possibly in the setting of dehydration and weakness. Seen by physical therapy who recommends SNF vs 24 hr supervision. Son did not wish her to be sernt to SNF. Wants her to go back to Assist living with services. He understands that she is high risk for fall.   Dementia She has moderate-to-severe  dementia which does not seem to have worsened with current infection. Continue Aricept.  Sinus bradycardia Heart rate currently stable. Unclear if this was contributing to her lightheadedness or near syncopal symptoms. Aricept can cause bradycardia and if this persist may need to be discontinued or changed.   History of TIA/stroke Continue Plavix and statin.    Diet: Regular      Code Status: DO NOT INTUBATE Family Communication: son at bedside Disposition Plan: Return to assist living with HHPT/OT and RN. Son refused SNF   Consultants:  None  Procedures:  None  Antibiotics:  IV Rocephin since 9/23  Discharge Exam: Filed Vitals:   08/09/15 1028  BP: 136/75  Pulse: 82  Temp:   Resp:      General: Elderly female in no acute distress  HEENT: No pallor, moist oral mucosa, supple neck  Chest: Clear to auscultation bilaterally  CVS: Normal S1 and S2, no murmurs rub or gallop  GI: Soft, nondistended, nontender, bowel sounds present  Musculoskeletal: Warm, edema  CNS: Alert and oriented 1  Discharge Instructions    Current Discharge Medication List    START taking these medications   Details  cephALEXin (KEFLEX) 500 MG capsule Take 1 capsule (500 mg total) by mouth 2 (two) times daily. Qty: 10 capsule, Refills: 0      CONTINUE these medications which have NOT CHANGED   Details  atorvastatin (LIPITOR) 10 MG tablet Take 1 tablet (10 mg total) by mouth daily at 6 PM. Qty: 90 tablet, Refills: 0    calcium gluconate 500 MG tablet Take 500 mg by mouth daily.  clopidogrel (PLAVIX) 75 MG tablet TAKE 1 TABLET BY MOUTH EVERY DAY Qty: 30 tablet, Refills: 5    donepezil (ARICEPT) 23 MG TABS tablet Take 1 tablet (23 mg total) by mouth at bedtime. Qty: 30 tablet, Refills: 5    lisinopril (PRINIVIL,ZESTRIL) 10 MG tablet Take 10 mg by mouth daily.      STOP taking these medications     traMADol-acetaminophen (ULTRACET) 37.5-325 MG per tablet         No Known Allergies Follow-up Information    Follow up with Garnet Koyanagi, DO. Call in 1 week.   Specialty:  Family Medicine   Contact information:   [address] [place] [address] [phone]        The results of significant diagnostics from this hospitalization (including imaging, microbiology, ancillary and laboratory) are listed below for reference.    Significant Diagnostic Studies: Dg Chest 2 View  08/08/2015   CLINICAL DATA:  Confusion.  Near syncope.  EXAM: CHEST  2 VIEW  COMPARISON:  07/13/2014.  FINDINGS: Cardiac silhouette is normal in size and configuration. Normal mediastinal and hilar contours.  Lungs are mildly hyperexpanded. There is mild interstitial thickening bilaterally similar to the prior study. No lung consolidation or edema. No pleural effusion or pneumothorax.  Bony thorax is demineralized but grossly intact.  IMPRESSION: No acute cardiopulmonary disease.   Electronically Signed   By: Lajean Manes M.D.   On: 08/08/2015 10:07    Microbiology: Recent Results (from the past 240 hour(s))  MRSA PCR Screening     Status: None   Collection Time: 08/08/15  3:59 PM  Result Value Ref Range Status   MRSA by PCR NEGATIVE NEGATIVE Final    Comment:        The GeneXpert MRSA Assay (FDA approved for NASAL specimens only), is one component of a comprehensive MRSA colonization surveillance program. It is not intended to diagnose MRSA infection nor to guide or monitor treatment for MRSA infections.      Labs: Basic Metabolic Panel:  Recent Labs Lab 08/08/15 0948  NA 141  K 4.3  CL 104  CO2 29  GLUCOSE 102*  BUN 12  CREATININE 0.91  CALCIUM 9.3   Liver Function Tests:  Recent Labs Lab 08/08/15 0948  AST 19  ALT 12*  ALKPHOS 41  BILITOT 1.4*  PROT 5.9*  ALBUMIN 3.7    Recent Labs Lab 08/08/15 0948  LIPASE 35   No results for input(s): AMMONIA in the last 168 hours. CBC:  Recent Labs Lab 08/08/15 0948  WBC 7.1   NEUTROABS 5.0  HGB 14.0  HCT 41.8  MCV 95.9  PLT 199   Cardiac Enzymes:  Recent Labs Lab 08/08/15 0948  TROPONINI <0.03   BNP: BNP (last 3 results) No results for input(s): BNP in the last 8760 hours.  ProBNP (last 3 results) No results for input(s): PROBNP in the last 8760 hours.  CBG: No results for input(s): GLUCAP in the last 168 hours.     SignedLouellen Molder  [facility] 08/09/2015, 11:57 AM

## 2015-08-09 NOTE — Progress Notes (Signed)
Patient discharged to [facility]. IV access was d/c'd. Vitals are stable. Skin is intact except as charted in most recent assessments. Pt to be escorted out by NT, to be driven home by family. Going to Assisted Living. Transported by son. Report called.

## 2015-08-09 NOTE — Care Management Note (Signed)
Case Management Note  Patient Details  Name: ALTAGRACIA RONE MRN: [id number] Date of Birth: Dec 06, 1929  Subjective/Objective:                   Numbness, abdominal pain, fall, ankle swelling, weight loss, pain all over Action/Plan: Discharge planning  Expected Discharge Date:  08/09/15               Expected Discharge Plan:  Assisted Living / Rest Home  In-House Referral:  Clinical Social Work  Discharge planning Services  CM Consult  Post Acute Care Choice:    Choice offered to:     DME Arranged:    DME Agency:     HH Arranged:  RN, PT, OT HH Agency:  [facility]  Status of Service:  Completed, signed off  Medicare Important Message Given:    Date Medicare IM Given:    Medicare IM give by:    Date Additional Medicare IM Given:    Additional Medicare Important Message give by:     If discussed at [place] of Stay Meetings, dates discussed:    Additional Comments: Pt is an OBSERVATION pt from [facility]. [facility] contracts out to [facility] for [facility] needs.  CM called [facility] rep, Tommi Emery to notify of pt return to [facility].  No other CM needs were communicated. Dellie Catholic, RN 08/09/2015, 4:14 PM

## 2015-08-09 NOTE — Evaluation (Signed)
Physical Therapy Evaluation Patient Details Name: Kimberly Craig MRN: [id number] DOB: 1930-08-13 Today's Date: 08/09/2015   History of Present Illness  81 y.o. female from [place] home admitted for near syncope. PMH consists of Alzheimer's dementia.  Clinical Impression  Pt admitted with above diagnosis. Pt currently with functional limitations due to the deficits listed below (see PT Problem List). Pt with significant confusion, limiting ability to participate in PT eval. Pt saturated with urine upon PT arrival. Assisted with pericare. Pt required min assist for transfers and gait 5 feet with RW. Pt reporting fatigue, requesting return to bed which limited gait distance. Pt is at high risk for falls. Pt will benefit from skilled PT to increase their independence and safety with mobility to allow discharge to the venue listed below.       Follow Up Recommendations Supervision/Assistance - 24 hour;SNF    Equipment Recommendations  None recommended by PT    Recommendations for Other Services       Precautions / Restrictions Precautions Precautions: Fall      Mobility  Bed Mobility Overal bed mobility: Needs Assistance Bed Mobility: Supine to Sit;Sit to Supine     Supine to sit: Supervision Sit to supine: Supervision   General bed mobility comments: supervision for safety only  Transfers Overall transfer level: Needs assistance Equipment used: Rolling walker (2 wheeled) Transfers: Sit to/from [facility] Sit to Stand: Min assist Stand pivot transfers: Min assist       General transfer comment: verbal cues for hand placement, physical assist to power up  Ambulation/Gait Ambulation/Gait assistance: Min assist Ambulation Distance (Feet): 5 Feet Assistive device: Rolling walker (2 wheeled) Gait Pattern/deviations: Step-through pattern;Decreased stride length Gait velocity: decreased   General Gait Details: Pt reports she is tired and needs  to lie down, limiting gait distance to 5 feet  Stairs            Wheelchair Mobility    Modified Rankin (Stroke Patients Only)       Balance                                             Pertinent Vitals/Pain Pain Assessment: No/denies pain    Home Living Family/patient expects to be discharged to:: Assisted living                      Prior Function Level of Independence: Needs assistance   Gait / Transfers Assistance Needed: RW for ambulation, assist needed from staff   ADL's / Homemaking Assistance Needed: Resident of [facility]        Hand Dominance        Extremity/Trunk Assessment   Upper Extremity Assessment: Generalized weakness           Lower Extremity Assessment: Generalized weakness      Cervical / Trunk Assessment: Normal  Communication   Communication: No difficulties  Cognition Arousal/Alertness: Awake/alert Behavior During Therapy: Anxious Overall Cognitive Status: History of cognitive impairments - at baseline (A&O x 1 (person))                      General Comments      Exercises        Assessment/Plan    PT Assessment Patient needs continued PT services  PT Diagnosis Difficulty walking;Generalized weakness   PT Problem  List Decreased strength;Decreased activity tolerance;Decreased balance;Decreased mobility;Decreased knowledge of precautions;Decreased safety awareness;Decreased cognition  PT Treatment Interventions DME instruction;Gait training;Functional mobility training;Therapeutic activities;Therapeutic exercise;Patient/family education;Balance training;Cognitive remediation   PT Goals (Current goals can be found in the Care Plan section) Acute Rehab PT Goals Patient Stated Goal: unable to state PT Goal Formulation: Patient unable to participate in goal setting Time For Goal Achievement: 08/23/15 Potential to Achieve Goals: Fair    Frequency Min 2X/week   Barriers to  discharge        Co-evaluation               End of Session Equipment Utilized During Treatment: Gait belt Activity Tolerance: Patient limited by fatigue Patient left: in bed;with call bell/phone within reach;with bed alarm set Nurse Communication: Mobility status    Functional Assessment Tool Used: clinical judgement Functional Limitation: Mobility: Walking and moving around Mobility: Walking and Moving Around Current Status [phone]): At least 20 percent but less than 40 percent impaired, limited or restricted Mobility: Walking and Moving Around Goal Status [phone]): At least 1 percent but less than 20 percent impaired, limited or restricted    Time: 0920-0936 PT Time Calculation (min) (ACUTE ONLY): 16 min   Charges:   PT Evaluation $Initial PT Evaluation Tier I: 1 Procedure     PT G Codes:   PT G-Codes **NOT FOR INPATIENT CLASS** Functional Assessment Tool Used: clinical judgement Functional Limitation: Mobility: Walking and moving around Mobility: Walking and Moving Around Current Status (Y3016): At least 20 percent but less than 40 percent impaired, limited or restricted Mobility: Walking and Moving Around Goal Status [phone]): At least 1 percent but less than 20 percent impaired, limited or restricted    Lorriane Shire 08/09/2015, 10:14 AM

## 2015-08-10 LAB — URINE CULTURE: Special Requests: NORMAL

## 2015-08-13 DIAGNOSIS — R001 Bradycardia, unspecified: Secondary | ICD-10-CM | POA: Diagnosis not present

## 2015-08-13 DIAGNOSIS — R2681 Unsteadiness on feet: Secondary | ICD-10-CM | POA: Diagnosis not present

## 2015-08-13 DIAGNOSIS — R296 Repeated falls: Secondary | ICD-10-CM | POA: Diagnosis not present

## 2015-08-13 DIAGNOSIS — N390 Urinary tract infection, site not specified: Secondary | ICD-10-CM | POA: Diagnosis not present

## 2015-08-13 DIAGNOSIS — E46 Unspecified protein-calorie malnutrition: Secondary | ICD-10-CM | POA: Diagnosis not present

## 2015-08-13 DIAGNOSIS — F0390 Unspecified dementia without behavioral disturbance: Secondary | ICD-10-CM | POA: Diagnosis not present

## 2015-08-14 ENCOUNTER — Telehealth: Payer: Self-pay | Admitting: Family Medicine

## 2015-08-14 ENCOUNTER — Telehealth: Payer: Self-pay

## 2015-08-14 NOTE — Telephone Encounter (Signed)
Kimberly Craig called back. She said if she doesn't answer you can leave Verbal order on her confidential voicemail.

## 2015-08-14 NOTE — Telephone Encounter (Signed)
Ok to give verbal order-- see previous telephone note

## 2015-08-14 NOTE — Telephone Encounter (Signed)
Ok per Dr. Etter Sjogren . Will Call again tomorrow,no answer in office today.

## 2015-08-14 NOTE — Telephone Encounter (Signed)
Caller name: Estill Bamberg Relation to pt: RN from [employer] back number: [phone]  Pharmacy:  Reason for call:  Requesting verbal orders for 2x a week for 1 week and 1x for 3 weeks. Verbal orders for education regarding UTI and medication management orders for PT and OT

## 2015-08-15 DIAGNOSIS — F0390 Unspecified dementia without behavioral disturbance: Secondary | ICD-10-CM | POA: Diagnosis not present

## 2015-08-15 DIAGNOSIS — R296 Repeated falls: Secondary | ICD-10-CM | POA: Diagnosis not present

## 2015-08-15 DIAGNOSIS — E46 Unspecified protein-calorie malnutrition: Secondary | ICD-10-CM | POA: Diagnosis not present

## 2015-08-15 DIAGNOSIS — R001 Bradycardia, unspecified: Secondary | ICD-10-CM | POA: Diagnosis not present

## 2015-08-15 DIAGNOSIS — R2681 Unsteadiness on feet: Secondary | ICD-10-CM | POA: Diagnosis not present

## 2015-08-15 DIAGNOSIS — N390 Urinary tract infection, site not specified: Secondary | ICD-10-CM | POA: Diagnosis not present

## 2015-08-15 NOTE — Telephone Encounter (Signed)
Spoke with Estill Bamberg regarding verbal orders for OT PT per Dr. Etter Sjogren.

## 2015-08-15 NOTE — Telephone Encounter (Signed)
Verbal order given  

## 2015-08-19 DIAGNOSIS — N390 Urinary tract infection, site not specified: Secondary | ICD-10-CM | POA: Diagnosis not present

## 2015-08-19 DIAGNOSIS — E46 Unspecified protein-calorie malnutrition: Secondary | ICD-10-CM | POA: Diagnosis not present

## 2015-08-19 DIAGNOSIS — R001 Bradycardia, unspecified: Secondary | ICD-10-CM | POA: Diagnosis not present

## 2015-08-19 DIAGNOSIS — F0390 Unspecified dementia without behavioral disturbance: Secondary | ICD-10-CM | POA: Diagnosis not present

## 2015-08-19 DIAGNOSIS — R296 Repeated falls: Secondary | ICD-10-CM | POA: Diagnosis not present

## 2015-08-19 DIAGNOSIS — R2681 Unsteadiness on feet: Secondary | ICD-10-CM | POA: Diagnosis not present

## 2015-08-19 IMAGING — CT CT HEAD W/O CM
3 of 6 series · 15 of 47 positions shown, 18 images · non-contrast
Comparison: Head CT 07/12/2014

CLINICAL DATA: Fell tonight.  Hit head.

EXAM:
CT HEAD WITHOUT CONTRAST
CT CERVICAL SPINE WITHOUT CONTRAST
TECHNIQUE: Multidetector CT imaging of the head and cervical spine was
performed following the standard protocol without intravenous
contrast. Multiplanar CT image reconstructions of the cervical spine
were also generated.

[Series 602: <mpr thick range> · sagittal · 0.31mm/px · 3 of 48 slices shown]
[im 16/48  brain]
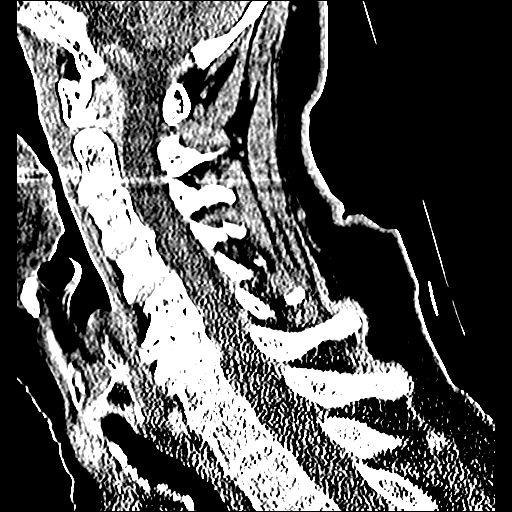
[im 24/48  brain]
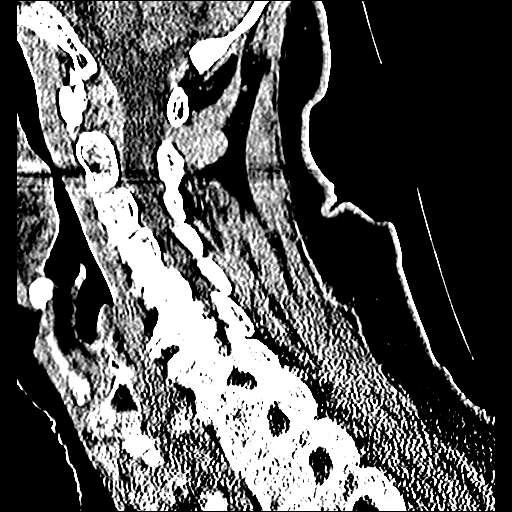
[im 32/48  brain]
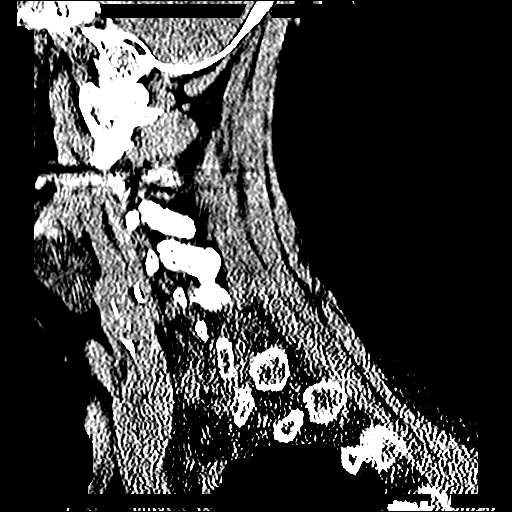

[Series 603: <mpr thick range(1)> · coronal · 0.31mm/px · 3 of 51 slices shown]
[im 17/51  brain]
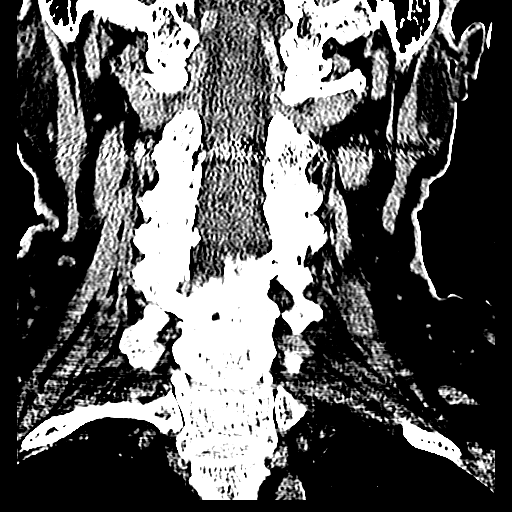
[im 23/51  brain]
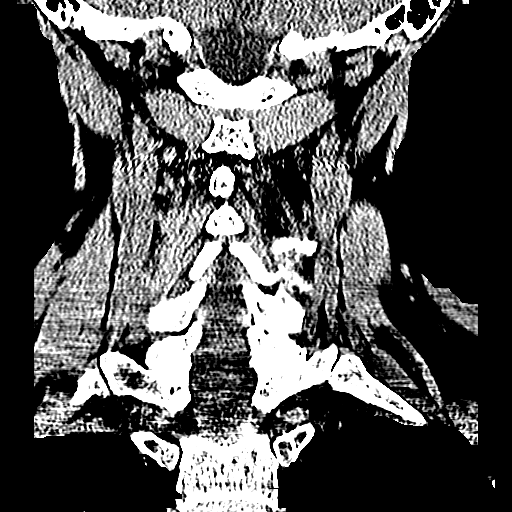
[im 28/51  brain]
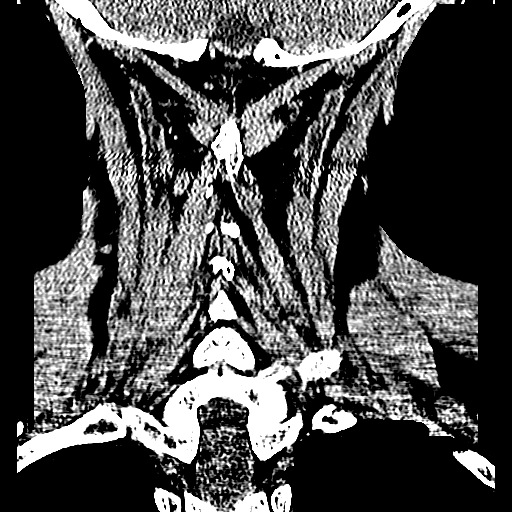

[Series 604: <mpr thick range(2)> · axial · 0.31mm/px · z∈[-313,-165]mm · 9 of 97 slices shown, 12 images]
[im 10/97  brain]
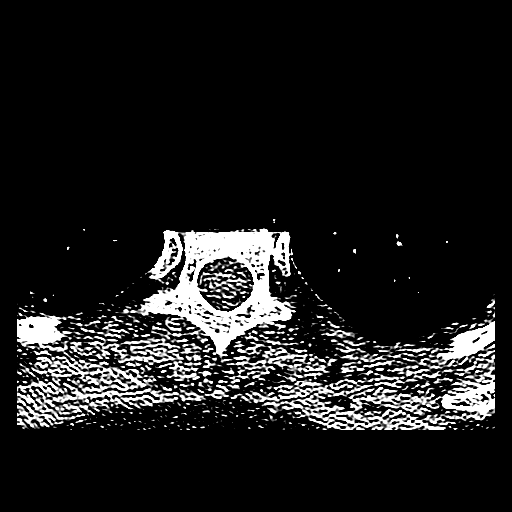
[im 10/97  bone]
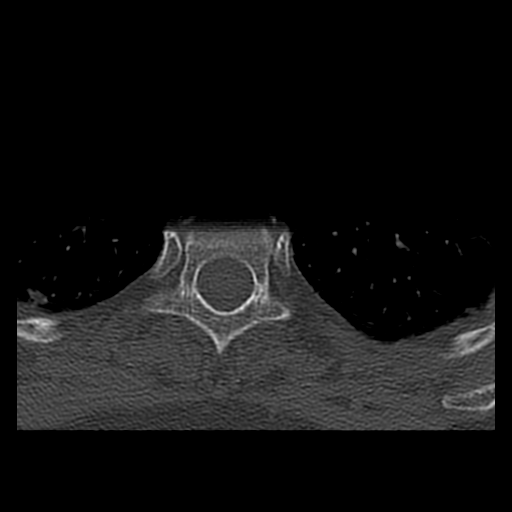
[im 20/97  brain]
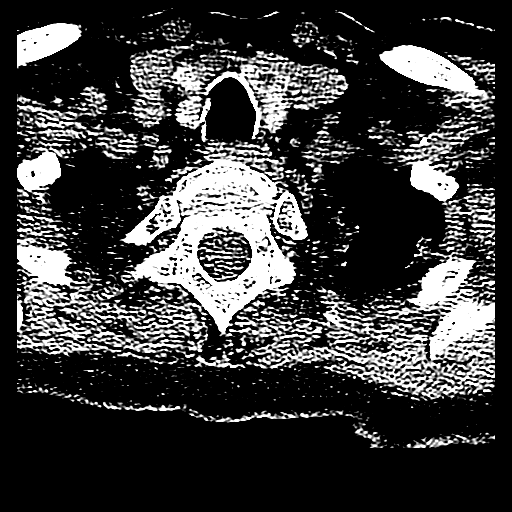
[im 29/97  brain]
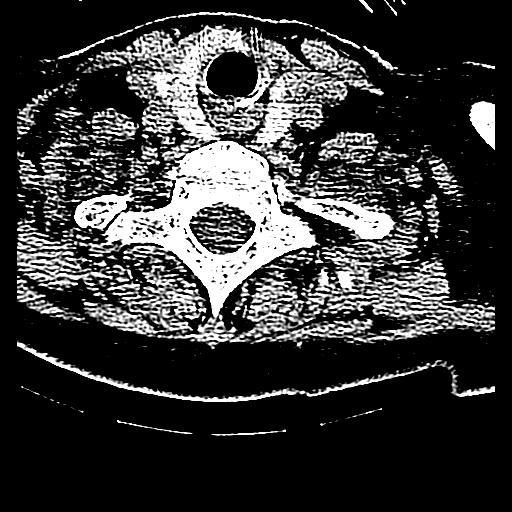
[im 39/97  brain]
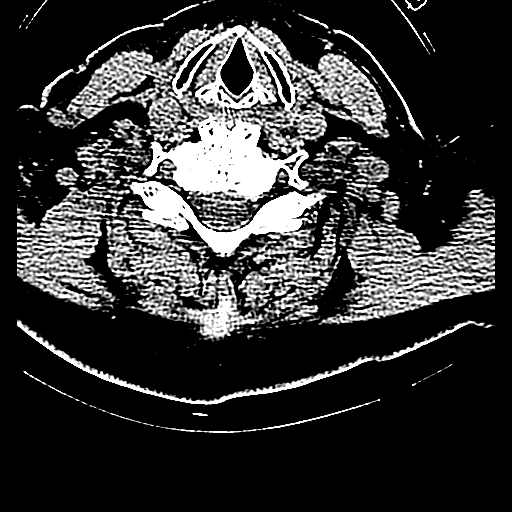
[im 49/97  brain]
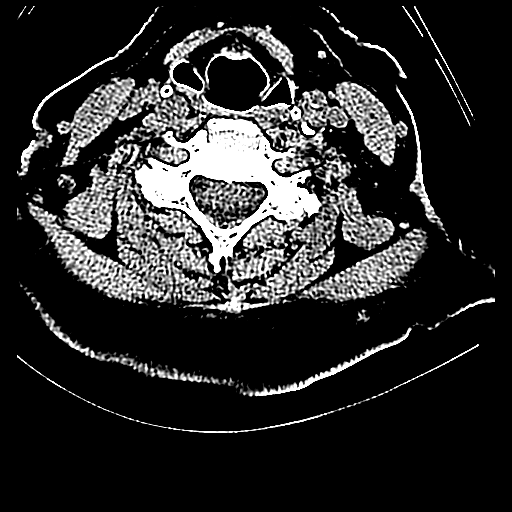
[im 49/97  bone]
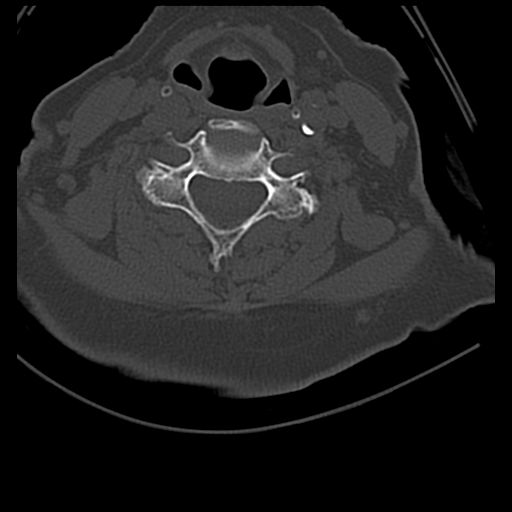
[im 58/97  brain]
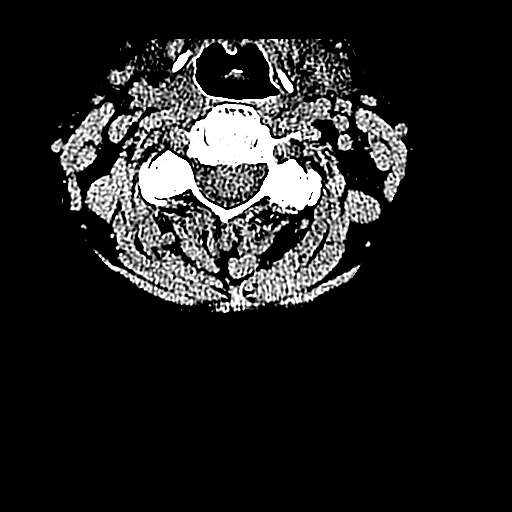
[im 68/97  brain]
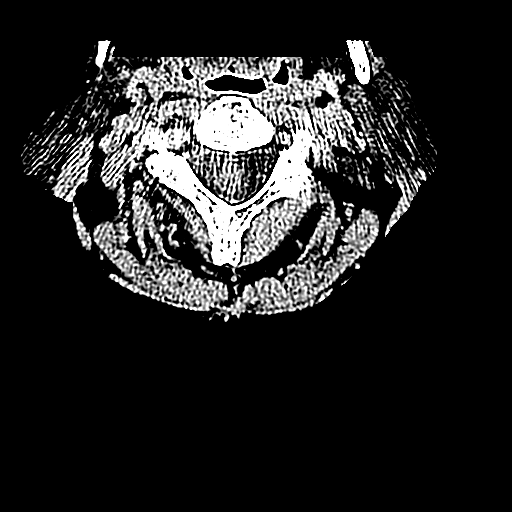
[im 77/97  brain]
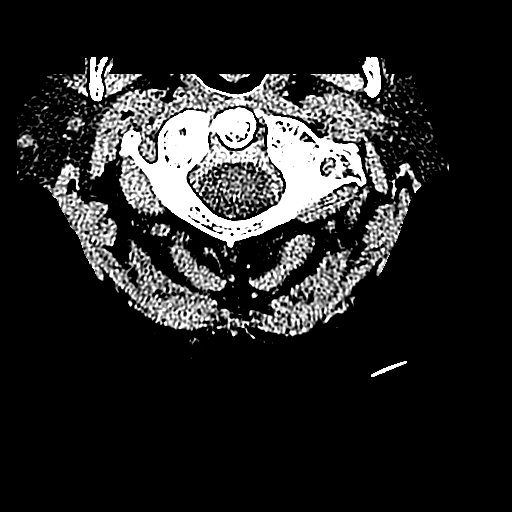
[im 87/97  brain]
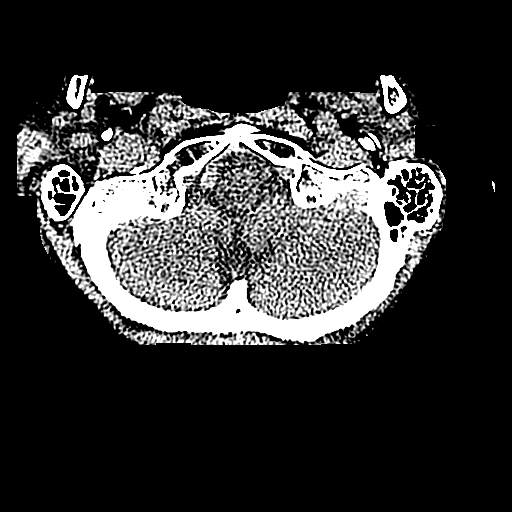
[im 87/97  bone]
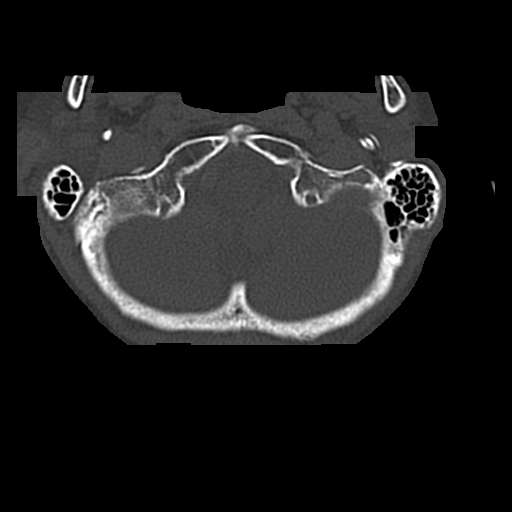

[15 of 47 positions shown; findings below may reference images not displayed]

FINDINGS: CT HEAD FINDINGS

Stable age related cerebral atrophy, ventriculomegaly and
periventricular white matter disease. No extra-axial fluid
collections are identified. No CT findings for acute hemispheric
infarction or intracranial hemorrhage. No mass lesions. The
brainstem and cerebellum are normal.

No acute skull fracture. The paranasal sinuses and mastoid air cells
are clear. The globes are intact.

CT CERVICAL SPINE FINDINGS

Degenerative cervical spondylosis with disc disease mainly at C5-6
and C6-7 and fairly significant multilevel facet disease. No acute
fracture is identified. No abnormal prevertebral soft tissue
swelling. The skullbase C1 and C1-2 articulations are maintained.
IMPRESSION: Stable age related cerebral atrophy, ventriculomegaly and
periventricular white matter disease. No acute intracranial findings
or acute skull fracture.

Degenerative cervical spondylosis with multilevel disc disease and
facet disease but no definite acute cervical spine fracture.

## 2015-08-20 DIAGNOSIS — R001 Bradycardia, unspecified: Secondary | ICD-10-CM | POA: Diagnosis not present

## 2015-08-20 DIAGNOSIS — R2681 Unsteadiness on feet: Secondary | ICD-10-CM | POA: Diagnosis not present

## 2015-08-20 DIAGNOSIS — E46 Unspecified protein-calorie malnutrition: Secondary | ICD-10-CM | POA: Diagnosis not present

## 2015-08-20 DIAGNOSIS — N390 Urinary tract infection, site not specified: Secondary | ICD-10-CM | POA: Diagnosis not present

## 2015-08-20 DIAGNOSIS — F0390 Unspecified dementia without behavioral disturbance: Secondary | ICD-10-CM | POA: Diagnosis not present

## 2015-08-20 DIAGNOSIS — R296 Repeated falls: Secondary | ICD-10-CM | POA: Diagnosis not present

## 2015-08-21 DIAGNOSIS — F0390 Unspecified dementia without behavioral disturbance: Secondary | ICD-10-CM | POA: Diagnosis not present

## 2015-08-21 DIAGNOSIS — N390 Urinary tract infection, site not specified: Secondary | ICD-10-CM | POA: Diagnosis not present

## 2015-08-21 DIAGNOSIS — R001 Bradycardia, unspecified: Secondary | ICD-10-CM | POA: Diagnosis not present

## 2015-08-21 DIAGNOSIS — R2681 Unsteadiness on feet: Secondary | ICD-10-CM | POA: Diagnosis not present

## 2015-08-21 DIAGNOSIS — E46 Unspecified protein-calorie malnutrition: Secondary | ICD-10-CM | POA: Diagnosis not present

## 2015-08-21 DIAGNOSIS — R296 Repeated falls: Secondary | ICD-10-CM | POA: Diagnosis not present

## 2015-08-24 ENCOUNTER — Other Ambulatory Visit: Payer: Self-pay | Admitting: Family Medicine

## 2015-08-25 DIAGNOSIS — R001 Bradycardia, unspecified: Secondary | ICD-10-CM | POA: Diagnosis not present

## 2015-08-25 DIAGNOSIS — F0390 Unspecified dementia without behavioral disturbance: Secondary | ICD-10-CM | POA: Diagnosis not present

## 2015-08-25 DIAGNOSIS — N390 Urinary tract infection, site not specified: Secondary | ICD-10-CM | POA: Diagnosis not present

## 2015-08-25 DIAGNOSIS — R296 Repeated falls: Secondary | ICD-10-CM | POA: Diagnosis not present

## 2015-08-25 DIAGNOSIS — E46 Unspecified protein-calorie malnutrition: Secondary | ICD-10-CM | POA: Diagnosis not present

## 2015-08-25 DIAGNOSIS — R2681 Unsteadiness on feet: Secondary | ICD-10-CM | POA: Diagnosis not present

## 2015-08-26 ENCOUNTER — Telehealth: Payer: Self-pay | Admitting: *Deleted

## 2015-08-26 DIAGNOSIS — R296 Repeated falls: Secondary | ICD-10-CM | POA: Diagnosis not present

## 2015-08-26 DIAGNOSIS — F0390 Unspecified dementia without behavioral disturbance: Secondary | ICD-10-CM | POA: Diagnosis not present

## 2015-08-26 DIAGNOSIS — R001 Bradycardia, unspecified: Secondary | ICD-10-CM | POA: Diagnosis not present

## 2015-08-26 DIAGNOSIS — E46 Unspecified protein-calorie malnutrition: Secondary | ICD-10-CM | POA: Diagnosis not present

## 2015-08-26 DIAGNOSIS — N390 Urinary tract infection, site not specified: Secondary | ICD-10-CM | POA: Diagnosis not present

## 2015-08-26 DIAGNOSIS — R2681 Unsteadiness on feet: Secondary | ICD-10-CM | POA: Diagnosis not present

## 2015-08-26 NOTE — Telephone Encounter (Signed)
Received home health certification and plan of care via fax for cert period: 3/33/54-56/25/63. Forwarded to Dr. Etter Sjogren. JG//CMA

## 2015-08-28 DIAGNOSIS — E46 Unspecified protein-calorie malnutrition: Secondary | ICD-10-CM | POA: Diagnosis not present

## 2015-08-28 DIAGNOSIS — R001 Bradycardia, unspecified: Secondary | ICD-10-CM | POA: Diagnosis not present

## 2015-08-28 DIAGNOSIS — R2681 Unsteadiness on feet: Secondary | ICD-10-CM | POA: Diagnosis not present

## 2015-08-28 DIAGNOSIS — N390 Urinary tract infection, site not specified: Secondary | ICD-10-CM | POA: Diagnosis not present

## 2015-08-28 DIAGNOSIS — F0390 Unspecified dementia without behavioral disturbance: Secondary | ICD-10-CM | POA: Diagnosis not present

## 2015-08-28 DIAGNOSIS — R296 Repeated falls: Secondary | ICD-10-CM | POA: Diagnosis not present

## 2015-08-29 ENCOUNTER — Telehealth: Payer: Self-pay | Admitting: Family Medicine

## 2015-08-29 DIAGNOSIS — I639 Cerebral infarction, unspecified: Secondary | ICD-10-CM

## 2015-08-29 NOTE — Telephone Encounter (Signed)
Please advise      KP 

## 2015-08-29 NOTE — Telephone Encounter (Signed)
Caller name: Laurey Arrow  Relationship to patient: Physical Therapist   Can be reached: [phone] Fax: [phone]   Reason for call: She is requesting a Rx for a manual wheelchair and would like to have it faxed to the number provided.      Thanks.

## 2015-08-29 NOTE — Telephone Encounter (Signed)
Ok to give order---hx cva,  Hx protein cal malnutrition

## 2015-08-29 NOTE — Telephone Encounter (Signed)
Order faxed.    KP 

## 2015-09-01 DIAGNOSIS — R2681 Unsteadiness on feet: Secondary | ICD-10-CM | POA: Diagnosis not present

## 2015-09-01 DIAGNOSIS — R001 Bradycardia, unspecified: Secondary | ICD-10-CM | POA: Diagnosis not present

## 2015-09-01 DIAGNOSIS — E46 Unspecified protein-calorie malnutrition: Secondary | ICD-10-CM | POA: Diagnosis not present

## 2015-09-01 DIAGNOSIS — F0390 Unspecified dementia without behavioral disturbance: Secondary | ICD-10-CM | POA: Diagnosis not present

## 2015-09-01 DIAGNOSIS — R296 Repeated falls: Secondary | ICD-10-CM | POA: Diagnosis not present

## 2015-09-01 DIAGNOSIS — N390 Urinary tract infection, site not specified: Secondary | ICD-10-CM | POA: Diagnosis not present

## 2015-09-02 NOTE — Telephone Encounter (Signed)
She needs face to face

## 2015-09-02 NOTE — Telephone Encounter (Addendum)
Caller name: Myriam Jacobson from [place]  Call back number:[phone] [phone] and fax # [phone]   Reason for call:    Requesting the narrative on script be placed on the last office note and faxed over to [phone]

## 2015-09-02 NOTE — Telephone Encounter (Signed)
To MD to addend    KP

## 2015-09-03 ENCOUNTER — Other Ambulatory Visit: Payer: Self-pay | Admitting: Family Medicine

## 2015-09-03 DIAGNOSIS — R296 Repeated falls: Secondary | ICD-10-CM | POA: Diagnosis not present

## 2015-09-03 DIAGNOSIS — E46 Unspecified protein-calorie malnutrition: Secondary | ICD-10-CM | POA: Diagnosis not present

## 2015-09-03 DIAGNOSIS — R001 Bradycardia, unspecified: Secondary | ICD-10-CM | POA: Diagnosis not present

## 2015-09-03 DIAGNOSIS — N390 Urinary tract infection, site not specified: Secondary | ICD-10-CM | POA: Diagnosis not present

## 2015-09-03 DIAGNOSIS — F0390 Unspecified dementia without behavioral disturbance: Secondary | ICD-10-CM | POA: Diagnosis not present

## 2015-09-03 DIAGNOSIS — R2681 Unsteadiness on feet: Secondary | ICD-10-CM | POA: Diagnosis not present

## 2015-09-03 NOTE — Telephone Encounter (Signed)
I called [facility] and Left a message to call the office     KP

## 2015-09-04 DIAGNOSIS — E46 Unspecified protein-calorie malnutrition: Secondary | ICD-10-CM | POA: Diagnosis not present

## 2015-09-04 DIAGNOSIS — N390 Urinary tract infection, site not specified: Secondary | ICD-10-CM | POA: Diagnosis not present

## 2015-09-04 DIAGNOSIS — F0390 Unspecified dementia without behavioral disturbance: Secondary | ICD-10-CM | POA: Diagnosis not present

## 2015-09-04 DIAGNOSIS — R296 Repeated falls: Secondary | ICD-10-CM | POA: Diagnosis not present

## 2015-09-04 DIAGNOSIS — R2681 Unsteadiness on feet: Secondary | ICD-10-CM | POA: Diagnosis not present

## 2015-09-04 DIAGNOSIS — R001 Bradycardia, unspecified: Secondary | ICD-10-CM | POA: Diagnosis not present

## 2015-09-04 NOTE — Telephone Encounter (Signed)
Last seen 06/05/15 and filled 07/14/15 #30 with 1 refill  Please advise      KP

## 2015-09-05 ENCOUNTER — Telehealth: Payer: Self-pay | Admitting: Family Medicine

## 2015-09-05 NOTE — Telephone Encounter (Signed)
I made casey aware the patient needs an OV and we are waiting for a return call so we can schedule and have the appropriate documentation. I asked if the family calls them to have them give [name] a call.     KP

## 2015-09-05 NOTE — Telephone Encounter (Signed)
error:[id number] ° °

## 2015-09-05 NOTE — Telephone Encounter (Signed)
lvm advising patient of message below °

## 2015-09-05 NOTE — Telephone Encounter (Signed)
Caller name: Myriam Jacobson from [place]  Call back number:[phone] [phone] and fax # [phone]   Reason for call:   Myriam Jacobson called in regards to the script faxed over and would like to speak with Maudie Mercury directly.

## 2015-09-05 NOTE — Telephone Encounter (Signed)
Please schedule this patient an apt.      KP 

## 2015-09-09 DIAGNOSIS — R2681 Unsteadiness on feet: Secondary | ICD-10-CM | POA: Diagnosis not present

## 2015-09-09 DIAGNOSIS — F0390 Unspecified dementia without behavioral disturbance: Secondary | ICD-10-CM | POA: Diagnosis not present

## 2015-09-09 DIAGNOSIS — E46 Unspecified protein-calorie malnutrition: Secondary | ICD-10-CM | POA: Diagnosis not present

## 2015-09-09 DIAGNOSIS — R296 Repeated falls: Secondary | ICD-10-CM | POA: Diagnosis not present

## 2015-09-09 DIAGNOSIS — N390 Urinary tract infection, site not specified: Secondary | ICD-10-CM | POA: Diagnosis not present

## 2015-09-09 DIAGNOSIS — R001 Bradycardia, unspecified: Secondary | ICD-10-CM | POA: Diagnosis not present

## 2015-09-10 DIAGNOSIS — F0390 Unspecified dementia without behavioral disturbance: Secondary | ICD-10-CM | POA: Diagnosis not present

## 2015-09-10 DIAGNOSIS — R001 Bradycardia, unspecified: Secondary | ICD-10-CM | POA: Diagnosis not present

## 2015-09-10 DIAGNOSIS — N390 Urinary tract infection, site not specified: Secondary | ICD-10-CM | POA: Diagnosis not present

## 2015-09-10 DIAGNOSIS — R2681 Unsteadiness on feet: Secondary | ICD-10-CM | POA: Diagnosis not present

## 2015-09-10 DIAGNOSIS — E46 Unspecified protein-calorie malnutrition: Secondary | ICD-10-CM | POA: Diagnosis not present

## 2015-09-10 DIAGNOSIS — R296 Repeated falls: Secondary | ICD-10-CM | POA: Diagnosis not present

## 2015-09-11 DIAGNOSIS — R001 Bradycardia, unspecified: Secondary | ICD-10-CM | POA: Diagnosis not present

## 2015-09-11 DIAGNOSIS — N390 Urinary tract infection, site not specified: Secondary | ICD-10-CM | POA: Diagnosis not present

## 2015-09-11 DIAGNOSIS — F0390 Unspecified dementia without behavioral disturbance: Secondary | ICD-10-CM | POA: Diagnosis not present

## 2015-09-11 DIAGNOSIS — R2681 Unsteadiness on feet: Secondary | ICD-10-CM | POA: Diagnosis not present

## 2015-09-11 DIAGNOSIS — E46 Unspecified protein-calorie malnutrition: Secondary | ICD-10-CM | POA: Diagnosis not present

## 2015-09-11 DIAGNOSIS — R296 Repeated falls: Secondary | ICD-10-CM | POA: Diagnosis not present

## 2015-09-16 DIAGNOSIS — N390 Urinary tract infection, site not specified: Secondary | ICD-10-CM | POA: Diagnosis not present

## 2015-09-16 DIAGNOSIS — R296 Repeated falls: Secondary | ICD-10-CM | POA: Diagnosis not present

## 2015-09-16 DIAGNOSIS — E46 Unspecified protein-calorie malnutrition: Secondary | ICD-10-CM | POA: Diagnosis not present

## 2015-09-16 DIAGNOSIS — F0390 Unspecified dementia without behavioral disturbance: Secondary | ICD-10-CM | POA: Diagnosis not present

## 2015-09-16 DIAGNOSIS — R001 Bradycardia, unspecified: Secondary | ICD-10-CM | POA: Diagnosis not present

## 2015-09-16 DIAGNOSIS — R2681 Unsteadiness on feet: Secondary | ICD-10-CM | POA: Diagnosis not present

## 2015-09-17 DIAGNOSIS — R001 Bradycardia, unspecified: Secondary | ICD-10-CM | POA: Diagnosis not present

## 2015-09-17 DIAGNOSIS — R296 Repeated falls: Secondary | ICD-10-CM | POA: Diagnosis not present

## 2015-09-17 DIAGNOSIS — F0390 Unspecified dementia without behavioral disturbance: Secondary | ICD-10-CM | POA: Diagnosis not present

## 2015-09-17 DIAGNOSIS — N390 Urinary tract infection, site not specified: Secondary | ICD-10-CM | POA: Diagnosis not present

## 2015-09-17 DIAGNOSIS — E46 Unspecified protein-calorie malnutrition: Secondary | ICD-10-CM | POA: Diagnosis not present

## 2015-09-17 DIAGNOSIS — R2681 Unsteadiness on feet: Secondary | ICD-10-CM | POA: Diagnosis not present

## 2015-09-18 DIAGNOSIS — N390 Urinary tract infection, site not specified: Secondary | ICD-10-CM | POA: Diagnosis not present

## 2015-09-18 DIAGNOSIS — E46 Unspecified protein-calorie malnutrition: Secondary | ICD-10-CM | POA: Diagnosis not present

## 2015-09-18 DIAGNOSIS — F0390 Unspecified dementia without behavioral disturbance: Secondary | ICD-10-CM | POA: Diagnosis not present

## 2015-09-18 DIAGNOSIS — R296 Repeated falls: Secondary | ICD-10-CM | POA: Diagnosis not present

## 2015-09-18 DIAGNOSIS — R001 Bradycardia, unspecified: Secondary | ICD-10-CM | POA: Diagnosis not present

## 2015-09-18 DIAGNOSIS — R2681 Unsteadiness on feet: Secondary | ICD-10-CM | POA: Diagnosis not present

## 2015-09-19 DIAGNOSIS — N390 Urinary tract infection, site not specified: Secondary | ICD-10-CM | POA: Diagnosis not present

## 2015-09-19 DIAGNOSIS — R2681 Unsteadiness on feet: Secondary | ICD-10-CM | POA: Diagnosis not present

## 2015-09-19 DIAGNOSIS — R001 Bradycardia, unspecified: Secondary | ICD-10-CM | POA: Diagnosis not present

## 2015-09-19 DIAGNOSIS — R296 Repeated falls: Secondary | ICD-10-CM | POA: Diagnosis not present

## 2015-09-19 DIAGNOSIS — E46 Unspecified protein-calorie malnutrition: Secondary | ICD-10-CM | POA: Diagnosis not present

## 2015-09-19 DIAGNOSIS — F0390 Unspecified dementia without behavioral disturbance: Secondary | ICD-10-CM | POA: Diagnosis not present

## 2015-09-22 DIAGNOSIS — R296 Repeated falls: Secondary | ICD-10-CM | POA: Diagnosis not present

## 2015-09-22 DIAGNOSIS — R2681 Unsteadiness on feet: Secondary | ICD-10-CM | POA: Diagnosis not present

## 2015-09-22 DIAGNOSIS — F0390 Unspecified dementia without behavioral disturbance: Secondary | ICD-10-CM | POA: Diagnosis not present

## 2015-09-22 DIAGNOSIS — R001 Bradycardia, unspecified: Secondary | ICD-10-CM | POA: Diagnosis not present

## 2015-09-22 DIAGNOSIS — N390 Urinary tract infection, site not specified: Secondary | ICD-10-CM | POA: Diagnosis not present

## 2015-09-22 DIAGNOSIS — E46 Unspecified protein-calorie malnutrition: Secondary | ICD-10-CM | POA: Diagnosis not present

## 2015-09-23 DIAGNOSIS — R296 Repeated falls: Secondary | ICD-10-CM | POA: Diagnosis not present

## 2015-09-23 DIAGNOSIS — F0390 Unspecified dementia without behavioral disturbance: Secondary | ICD-10-CM | POA: Diagnosis not present

## 2015-09-23 DIAGNOSIS — R001 Bradycardia, unspecified: Secondary | ICD-10-CM | POA: Diagnosis not present

## 2015-09-23 DIAGNOSIS — N390 Urinary tract infection, site not specified: Secondary | ICD-10-CM | POA: Diagnosis not present

## 2015-09-23 DIAGNOSIS — R2681 Unsteadiness on feet: Secondary | ICD-10-CM | POA: Diagnosis not present

## 2015-09-23 DIAGNOSIS — E46 Unspecified protein-calorie malnutrition: Secondary | ICD-10-CM | POA: Diagnosis not present

## 2015-09-24 DIAGNOSIS — R001 Bradycardia, unspecified: Secondary | ICD-10-CM | POA: Diagnosis not present

## 2015-09-24 DIAGNOSIS — R2681 Unsteadiness on feet: Secondary | ICD-10-CM | POA: Diagnosis not present

## 2015-09-24 DIAGNOSIS — F0390 Unspecified dementia without behavioral disturbance: Secondary | ICD-10-CM | POA: Diagnosis not present

## 2015-09-24 DIAGNOSIS — N390 Urinary tract infection, site not specified: Secondary | ICD-10-CM | POA: Diagnosis not present

## 2015-09-24 DIAGNOSIS — E46 Unspecified protein-calorie malnutrition: Secondary | ICD-10-CM | POA: Diagnosis not present

## 2015-09-24 DIAGNOSIS — R296 Repeated falls: Secondary | ICD-10-CM | POA: Diagnosis not present

## 2015-09-26 ENCOUNTER — Telehealth: Payer: Self-pay | Admitting: Family Medicine

## 2015-09-26 NOTE — Telephone Encounter (Signed)
Caller name: Faith  Relation to pt: from [place]  Call back number: [phone]  Pharmacy:  Reason for call:  Wanted to advise PCP missed her appointment due to attending a veteran day activity.

## 2015-09-26 NOTE — Telephone Encounter (Signed)
To MD as an FYI--  KP 

## 2015-09-29 DIAGNOSIS — E46 Unspecified protein-calorie malnutrition: Secondary | ICD-10-CM | POA: Diagnosis not present

## 2015-09-29 DIAGNOSIS — R296 Repeated falls: Secondary | ICD-10-CM | POA: Diagnosis not present

## 2015-09-29 DIAGNOSIS — N390 Urinary tract infection, site not specified: Secondary | ICD-10-CM | POA: Diagnosis not present

## 2015-09-29 DIAGNOSIS — R001 Bradycardia, unspecified: Secondary | ICD-10-CM | POA: Diagnosis not present

## 2015-09-29 DIAGNOSIS — F0390 Unspecified dementia without behavioral disturbance: Secondary | ICD-10-CM | POA: Diagnosis not present

## 2015-09-29 DIAGNOSIS — R2681 Unsteadiness on feet: Secondary | ICD-10-CM | POA: Diagnosis not present

## 2015-09-30 DIAGNOSIS — E46 Unspecified protein-calorie malnutrition: Secondary | ICD-10-CM | POA: Diagnosis not present

## 2015-09-30 DIAGNOSIS — N390 Urinary tract infection, site not specified: Secondary | ICD-10-CM | POA: Diagnosis not present

## 2015-09-30 DIAGNOSIS — R001 Bradycardia, unspecified: Secondary | ICD-10-CM | POA: Diagnosis not present

## 2015-09-30 DIAGNOSIS — R296 Repeated falls: Secondary | ICD-10-CM | POA: Diagnosis not present

## 2015-09-30 DIAGNOSIS — F0390 Unspecified dementia without behavioral disturbance: Secondary | ICD-10-CM | POA: Diagnosis not present

## 2015-09-30 DIAGNOSIS — R2681 Unsteadiness on feet: Secondary | ICD-10-CM | POA: Diagnosis not present

## 2015-10-14 DIAGNOSIS — R278 Other lack of coordination: Secondary | ICD-10-CM | POA: Diagnosis not present

## 2015-10-14 DIAGNOSIS — M6281 Muscle weakness (generalized): Secondary | ICD-10-CM | POA: Diagnosis not present

## 2015-10-14 DIAGNOSIS — R262 Difficulty in walking, not elsewhere classified: Secondary | ICD-10-CM | POA: Diagnosis not present

## 2015-10-15 DIAGNOSIS — M6281 Muscle weakness (generalized): Secondary | ICD-10-CM | POA: Diagnosis not present

## 2015-10-15 DIAGNOSIS — R262 Difficulty in walking, not elsewhere classified: Secondary | ICD-10-CM | POA: Diagnosis not present

## 2015-10-15 DIAGNOSIS — R278 Other lack of coordination: Secondary | ICD-10-CM | POA: Diagnosis not present

## 2015-10-16 DIAGNOSIS — R278 Other lack of coordination: Secondary | ICD-10-CM | POA: Diagnosis not present

## 2015-10-16 DIAGNOSIS — R262 Difficulty in walking, not elsewhere classified: Secondary | ICD-10-CM | POA: Diagnosis not present

## 2015-10-16 DIAGNOSIS — M6281 Muscle weakness (generalized): Secondary | ICD-10-CM | POA: Diagnosis not present

## 2015-10-17 ENCOUNTER — Telehealth: Payer: Self-pay | Admitting: *Deleted

## 2015-10-17 DIAGNOSIS — M6281 Muscle weakness (generalized): Secondary | ICD-10-CM | POA: Diagnosis not present

## 2015-10-17 DIAGNOSIS — R262 Difficulty in walking, not elsewhere classified: Secondary | ICD-10-CM | POA: Diagnosis not present

## 2015-10-17 DIAGNOSIS — R278 Other lack of coordination: Secondary | ICD-10-CM | POA: Diagnosis not present

## 2015-10-17 NOTE — Telephone Encounter (Signed)
Received and forwarded to Dr. Etter Sjogren.   I spoke with other providers and form will need to wait for Dr. Etter Sjogren to return because they have to have Dr. Nonda Lou signature. JG//CMA

## 2015-10-19 DIAGNOSIS — R55 Syncope and collapse: Secondary | ICD-10-CM | POA: Diagnosis not present

## 2015-10-19 DIAGNOSIS — R404 Transient alteration of awareness: Secondary | ICD-10-CM | POA: Diagnosis not present

## 2015-10-21 DIAGNOSIS — R262 Difficulty in walking, not elsewhere classified: Secondary | ICD-10-CM | POA: Diagnosis not present

## 2015-10-21 DIAGNOSIS — M6281 Muscle weakness (generalized): Secondary | ICD-10-CM | POA: Diagnosis not present

## 2015-10-21 DIAGNOSIS — R278 Other lack of coordination: Secondary | ICD-10-CM | POA: Diagnosis not present

## 2015-10-22 NOTE — Telephone Encounter (Signed)
Forms signed by Dr. Larose Kells and faxed to [facility] successfully. JG//CMA

## 2015-10-23 DIAGNOSIS — R278 Other lack of coordination: Secondary | ICD-10-CM | POA: Diagnosis not present

## 2015-10-23 DIAGNOSIS — R262 Difficulty in walking, not elsewhere classified: Secondary | ICD-10-CM | POA: Diagnosis not present

## 2015-10-23 DIAGNOSIS — M6281 Muscle weakness (generalized): Secondary | ICD-10-CM | POA: Diagnosis not present

## 2015-10-24 DIAGNOSIS — M6281 Muscle weakness (generalized): Secondary | ICD-10-CM | POA: Diagnosis not present

## 2015-10-24 DIAGNOSIS — R278 Other lack of coordination: Secondary | ICD-10-CM | POA: Diagnosis not present

## 2015-10-24 DIAGNOSIS — R262 Difficulty in walking, not elsewhere classified: Secondary | ICD-10-CM | POA: Diagnosis not present

## 2015-10-27 DIAGNOSIS — R278 Other lack of coordination: Secondary | ICD-10-CM | POA: Diagnosis not present

## 2015-10-27 DIAGNOSIS — M6281 Muscle weakness (generalized): Secondary | ICD-10-CM | POA: Diagnosis not present

## 2015-10-27 DIAGNOSIS — R262 Difficulty in walking, not elsewhere classified: Secondary | ICD-10-CM | POA: Diagnosis not present

## 2015-10-29 DIAGNOSIS — M6281 Muscle weakness (generalized): Secondary | ICD-10-CM | POA: Diagnosis not present

## 2015-10-29 DIAGNOSIS — R278 Other lack of coordination: Secondary | ICD-10-CM | POA: Diagnosis not present

## 2015-10-29 DIAGNOSIS — R262 Difficulty in walking, not elsewhere classified: Secondary | ICD-10-CM | POA: Diagnosis not present

## 2015-10-31 DIAGNOSIS — R262 Difficulty in walking, not elsewhere classified: Secondary | ICD-10-CM | POA: Diagnosis not present

## 2015-10-31 DIAGNOSIS — R278 Other lack of coordination: Secondary | ICD-10-CM | POA: Diagnosis not present

## 2015-10-31 DIAGNOSIS — M6281 Muscle weakness (generalized): Secondary | ICD-10-CM | POA: Diagnosis not present

## 2015-11-04 DIAGNOSIS — M6281 Muscle weakness (generalized): Secondary | ICD-10-CM | POA: Diagnosis not present

## 2015-11-04 DIAGNOSIS — R278 Other lack of coordination: Secondary | ICD-10-CM | POA: Diagnosis not present

## 2015-11-04 DIAGNOSIS — R262 Difficulty in walking, not elsewhere classified: Secondary | ICD-10-CM | POA: Diagnosis not present

## 2015-11-06 ENCOUNTER — Encounter (HOSPITAL_COMMUNITY): Payer: Self-pay

## 2015-11-06 ENCOUNTER — Emergency Department (HOSPITAL_COMMUNITY)
Admission: EM | Admit: 2015-11-06 | Discharge: 2015-11-06 | Disposition: A | Discharge status: Skilled Nursing Facility | Payer: Medicare Other | Attending: Emergency Medicine | Admitting: Emergency Medicine

## 2015-11-06 ENCOUNTER — Emergency Department (HOSPITAL_COMMUNITY): Payer: Medicare Other

## 2015-11-06 DIAGNOSIS — Z8673 Personal history of transient ischemic attack (TIA), and cerebral infarction without residual deficits: Secondary | ICD-10-CM | POA: Insufficient documentation

## 2015-11-06 DIAGNOSIS — Y9389 Activity, other specified: Secondary | ICD-10-CM | POA: Insufficient documentation

## 2015-11-06 DIAGNOSIS — Z85828 Personal history of other malignant neoplasm of skin: Secondary | ICD-10-CM | POA: Insufficient documentation

## 2015-11-06 DIAGNOSIS — W19XXXA Unspecified fall, initial encounter: Secondary | ICD-10-CM | POA: Diagnosis not present

## 2015-11-06 DIAGNOSIS — S0101XA Laceration without foreign body of scalp, initial encounter: Secondary | ICD-10-CM | POA: Insufficient documentation

## 2015-11-06 DIAGNOSIS — Z7902 Long term (current) use of antithrombotics/antiplatelets: Secondary | ICD-10-CM | POA: Insufficient documentation

## 2015-11-06 DIAGNOSIS — R259 Unspecified abnormal involuntary movements: Secondary | ICD-10-CM | POA: Diagnosis not present

## 2015-11-06 DIAGNOSIS — S098XXA Other specified injuries of head, initial encounter: Secondary | ICD-10-CM | POA: Diagnosis not present

## 2015-11-06 DIAGNOSIS — S4992XA Unspecified injury of left shoulder and upper arm, initial encounter: Secondary | ICD-10-CM | POA: Insufficient documentation

## 2015-11-06 DIAGNOSIS — Y999 Unspecified external cause status: Secondary | ICD-10-CM | POA: Insufficient documentation

## 2015-11-06 DIAGNOSIS — E785 Hyperlipidemia, unspecified: Secondary | ICD-10-CM | POA: Insufficient documentation

## 2015-11-06 DIAGNOSIS — M19042 Primary osteoarthritis, left hand: Secondary | ICD-10-CM | POA: Diagnosis not present

## 2015-11-06 DIAGNOSIS — Z8719 Personal history of other diseases of the digestive system: Secondary | ICD-10-CM | POA: Insufficient documentation

## 2015-11-06 DIAGNOSIS — F0390 Unspecified dementia without behavioral disturbance: Secondary | ICD-10-CM | POA: Diagnosis not present

## 2015-11-06 DIAGNOSIS — Z8541 Personal history of malignant neoplasm of cervix uteri: Secondary | ICD-10-CM | POA: Insufficient documentation

## 2015-11-06 DIAGNOSIS — S0181XA Laceration without foreign body of other part of head, initial encounter: Secondary | ICD-10-CM | POA: Diagnosis not present

## 2015-11-06 DIAGNOSIS — M25512 Pain in left shoulder: Secondary | ICD-10-CM | POA: Diagnosis not present

## 2015-11-06 DIAGNOSIS — M19041 Primary osteoarthritis, right hand: Secondary | ICD-10-CM | POA: Diagnosis not present

## 2015-11-06 DIAGNOSIS — S199XXA Unspecified injury of neck, initial encounter: Secondary | ICD-10-CM | POA: Diagnosis not present

## 2015-11-06 DIAGNOSIS — M25511 Pain in right shoulder: Secondary | ICD-10-CM

## 2015-11-06 DIAGNOSIS — Z8669 Personal history of other diseases of the nervous system and sense organs: Secondary | ICD-10-CM | POA: Insufficient documentation

## 2015-11-06 DIAGNOSIS — Y92129 Unspecified place in nursing home as the place of occurrence of the external cause: Secondary | ICD-10-CM | POA: Insufficient documentation

## 2015-11-06 DIAGNOSIS — I10 Essential (primary) hypertension: Secondary | ICD-10-CM | POA: Diagnosis not present

## 2015-11-06 DIAGNOSIS — Z79899 Other long term (current) drug therapy: Secondary | ICD-10-CM | POA: Insufficient documentation

## 2015-11-06 DIAGNOSIS — S4991XA Unspecified injury of right shoulder and upper arm, initial encounter: Secondary | ICD-10-CM | POA: Insufficient documentation

## 2015-11-06 DIAGNOSIS — S0990XA Unspecified injury of head, initial encounter: Secondary | ICD-10-CM | POA: Diagnosis present

## 2015-11-06 NOTE — Discharge Instructions (Signed)
CLEAN WOUND DAILY WITH SOAP AND WATER  Head Injury, Adult You have a head injury. Headaches and throwing up (vomiting) are common after a head injury. It should be easy to wake up from sleeping. Sometimes you must stay in the hospital. Most problems happen within the first 24 hours. Side effects may occur up to 7-10 days after the injury.  WHAT ARE THE TYPES OF HEAD INJURIES? Head injuries can be as minor as a bump. Some head injuries can be more severe. More severe head injuries include:  A jarring injury to the brain (concussion).  A bruise of the brain (contusion). This mean there is bleeding in the brain that can cause swelling.  A cracked skull (skull fracture).  Bleeding in the brain that collects, clots, and forms a bump (hematoma). WHEN SHOULD I GET HELP RIGHT AWAY?   You are confused or sleepy.  You cannot be woken up.  You feel sick to your stomach (nauseous) or keep throwing up (vomiting).  Your dizziness or unsteadiness is getting worse.  You have very bad, lasting headaches that are not helped by medicine. Take medicines only as told by your doctor.  You cannot use your arms or legs like normal.  You cannot walk.  You notice changes in the black spots in the center of the colored part of your eye (pupil).  You have clear or bloody fluid coming from your nose or ears.  You have trouble seeing. During the next 24 hours after the injury, you must stay with someone who can watch you. This person should get help right away (call 911 in the [place] if you start to shake and are not able to control it (have seizures), you pass out, or you are unable to wake up. HOW CAN I PREVENT A HEAD INJURY IN THE FUTURE?  Wear seat belts.  Wear a helmet while bike riding and playing sports like football.  Stay away from dangerous activities around the house. WHEN CAN I RETURN TO NORMAL ACTIVITIES AND ATHLETICS? See your doctor before doing these activities. You should not do normal  activities or play contact sports until 1 week after the following symptoms have stopped:  Headache that does not go away.  Dizziness.  Poor attention.  Confusion.  Memory problems.  Sickness to your stomach or throwing up.  Tiredness.  Fussiness.  Bothered by bright lights or loud noises.  Anxiousness or depression.  Restless sleep. MAKE SURE YOU:   Understand these instructions.  Will watch your condition.  Will get help right away if you are not doing well or get worse.   This information is not intended to replace advice given to you by your health care provider. Make sure you discuss any questions you have with your health care provider.   Document Released: 10/14/2008 Document Revised: 11/22/2014 Document Reviewed: 07/09/2013 Elsevier Interactive Patient Education 2016 [place], Adult A laceration is a cut that goes through all of the layers of the skin and into the tissue that is right under the skin. Some lacerations heal on their own. Others need to be closed with stitches (sutures), staples, skin adhesive strips, or skin glue. Proper laceration care minimizes the risk of infection and helps the laceration to heal better. HOW TO CARE FOR YOUR LACERATION If sutures or staples were used:  Keep the wound clean and dry.  If you were given a bandage (dressing), you should change it at least one time per day or as told  by your health care provider. You should also change it if it becomes wet or dirty.  Keep the wound completely dry for the first 24 hours or as told by your health care provider. After that time, you may shower or bathe. However, make sure that the wound is not soaked in water until after the sutures or staples have been removed.  Clean the wound one time each day or as told by your health care provider:  Wash the wound with soap and water.  Rinse the wound with water to remove all soap.  Pat the wound dry with a clean  towel. Do not rub the wound.  After cleaning the wound, apply a thin layer of antibiotic ointmentas told by your health care provider. This will help to prevent infection and keep the dressing from sticking to the wound.  Have the sutures or staples removed as told by your health care provider. If skin adhesive strips were used:  Keep the wound clean and dry.  If you were given a bandage (dressing), you should change it at least one time per day or as told by your health care provider. You should also change it if it becomes dirty or wet.  Do not get the skin adhesive strips wet. You may shower or bathe, but be careful to keep the wound dry.  If the wound gets wet, pat it dry with a clean towel. Do not rub the wound.  Skin adhesive strips fall off on their own. You may trim the strips as the wound heals. Do not remove skin adhesive strips that are still stuck to the wound. They will fall off in time. If skin glue was used:  Try to keep the wound dry, but you may briefly wet it in the shower or bath. Do not soak the wound in water, such as by swimming.  After you have showered or bathed, gently pat the wound dry with a clean towel. Do not rub the wound.  Do not do any activities that will make you sweat heavily until the skin glue has fallen off on its own.  Do not apply liquid, cream, or ointment medicine to the wound while the skin glue is in place. Using those may loosen the film before the wound has healed.  If you were given a bandage (dressing), you should change it at least one time per day or as told by your health care provider. You should also change it if it becomes dirty or wet.  If a dressing is placed over the wound, be careful not to apply tape directly over the skin glue. Doing that may cause the glue to be pulled off before the wound has healed.  Do not pick at the glue. The skin glue usually remains in place for 5-10 days, then it falls off of the skin. General  Instructions  Take over-the-counter and prescription medicines only as told by your health care provider.  If you were prescribed an antibiotic medicine or ointment, take or apply it as told by your doctor. Do not stop using it even if your condition improves.  To help prevent scarring, make sure to cover your wound with sunscreen whenever you are outside after stitches are removed, after adhesive strips are removed, or when glue remains in place and the wound is healed. Make sure to wear a sunscreen of at least 30 SPF.  Do not scratch or pick at the wound.  Keep all follow-up visits as told by  your health care provider. This is important.  Check your wound every day for signs of infection. Watch for:  Redness, swelling, or pain.  Fluid, blood, or pus.  Raise (elevate) the injured area above the level of your heart while you are sitting or lying down, if possible. SEEK MEDICAL CARE IF:  You received a tetanus shot and you have swelling, severe pain, redness, or bleeding at the injection site.  You have a fever.  A wound that was closed breaks open.  You notice a bad smell coming from your wound or your dressing.  You notice something coming out of the wound, such as wood or glass.  Your pain is not controlled with medicine.  You have increased redness, swelling, or pain at the site of your wound.  You have fluid, blood, or pus coming from your wound.  You notice a change in the color of your skin near your wound.  You need to change the dressing frequently due to fluid, blood, or pus draining from the wound.  You develop a new rash.  You develop numbness around the wound. SEEK IMMEDIATE MEDICAL CARE IF:  You develop severe swelling around the wound.  Your pain suddenly increases and is severe.  You develop painful lumps near the wound or on skin that is anywhere on your body.  You have a red streak going away from your wound.  The wound is on your hand or foot  and you cannot properly move a finger or toe.  The wound is on your hand or foot and you notice that your fingers or toes look pale or bluish.   This information is not intended to replace advice given to you by your health care provider. Make sure you discuss any questions you have with your health care provider.   Document Released: 11/01/2005 Document Revised: 03/18/2015 Document Reviewed: 10/28/2014 Elsevier Interactive Patient Education 2016 [employer]  Shoulder Pain The shoulder is the joint that connects your arms to your body. The bones that form the shoulder joint include the upper arm bone (humerus), the shoulder blade (scapula), and the collarbone (clavicle). The top of the humerus is shaped like a ball and fits into a rather flat socket on the scapula (glenoid cavity). A combination of muscles and strong, fibrous tissues that connect muscles to bones (tendons) support your shoulder joint and hold the ball in the socket. Small, fluid-filled sacs (bursae) are located in different areas of the joint. They act as cushions between the bones and the overlying soft tissues and help reduce friction between the gliding tendons and the bone as you move your arm. Your shoulder joint allows a wide range of motion in your arm. This range of motion allows you to do things like scratch your back or throw a ball. However, this range of motion also makes your shoulder more prone to pain from overuse and injury. Causes of shoulder pain can originate from both injury and overuse and usually can be grouped in the following four categories:  Redness, swelling, and pain (inflammation) of the tendon (tendinitis) or the bursae (bursitis).  Instability, such as a dislocation of the joint.  Inflammation of the joint (arthritis).  Broken bone (fracture). HOME CARE INSTRUCTIONS   Apply ice to the sore area.  Put ice in a plastic bag.  Place a towel between your skin and the bag.  Leave the ice on for  15-20 minutes, 3-4 times per day for the first 2 days, or as  directed by your health care provider.  Stop using cold packs if they do not help with the pain.  If you have a shoulder sling or immobilizer, wear it as long as your caregiver instructs. Only remove it to shower or bathe. Move your arm as little as possible, but keep your hand moving to prevent swelling.  Squeeze a soft ball or foam pad as much as possible to help prevent swelling.  Only take over-the-counter or prescription medicines for pain, discomfort, or fever as directed by your caregiver. SEEK MEDICAL CARE IF:   Your shoulder pain increases, or new pain develops in your arm, hand, or fingers.  Your hand or fingers become cold and numb.  Your pain is not relieved with medicines. SEEK IMMEDIATE MEDICAL CARE IF:   Your arm, hand, or fingers are numb or tingling.  Your arm, hand, or fingers are significantly swollen or turn white or blue. MAKE SURE YOU:   Understand these instructions.  Will watch your condition.  Will get help right away if you are not doing well or get worse.   This information is not intended to replace advice given to you by your health care provider. Make sure you discuss any questions you have with your health care provider.   Document Released: 08/11/2005 Document Revised: 11/22/2014 Document Reviewed: 02/24/2015 Elsevier Interactive Patient Education [employer].

## 2015-11-06 NOTE — ED Notes (Signed)
Pt brought in EMS from [facility].  Per facility pt was last seen in bed at 2300 last night.  Pt was found beside bed with pillow and blanket at 0730 this morning.  Pt has hx of dementia/Alzheimer's.  Pt has small lac to the back of head on left side.  Pt c/o neck pain upon palpation no other injuries noted.

## 2015-11-06 NOTE — ED Provider Notes (Signed)
CSN: [id number]     Arrival date & time 11/06/15  0830 History   First MD Initiated Contact with Patient 11/06/15 0831     Chief Complaint  Patient presents with  . Fall     (Consider location/radiation/quality/duration/timing/severity/associated sxs/prior Treatment) HPI Comments: Patient presents from [facility] nursing facility. The nursing staff found her lying on the floor next to her bed this morning. She was last seen last night. She was found on the floor with the pillow and a blanket over her she does have a history of dementia so history is limited due to this. She has no reported recent illnesses. No change in mental status. She has a small laceration to the left side of her scalp. She states that she only hurts in her head. She is on Plavix but no other anticoagulants.dede  Patient is a 81 y.o. female presenting with fall.  Fall    Past Medical History  Diagnosis Date  . Hypertension   . Hyperlipidemia   . Insomnia   . Dementia   . Hepatitis     not sure which  . Stroke [facility]) 2015    denies residual on 08/08/2015  . Arthritis     "hands" (08/08/2015)  . Depression   . Uterine cancer ([place])   . Skin cancer     "don't know what kind or where; it's in my skin" (08/08/2015)   Past Surgical History  Procedure Laterality Date  . Appendectomy    . Tonsillectomy     Family History  Problem Relation Age of Onset  . Stroke Father    Social History  Substance Use Topics  . Smoking status: Never Smoker   . Smokeless tobacco: Never Used  . Alcohol Use: No   OB History    No data available     Review of Systems  Reason unable to perform ROS: dementia.      Allergies  Review of patient's allergies indicates no known allergies.  Home Medications   Prior to Admission medications   Medication Sig Start Date End Date Taking? Authorizing Provider  atorvastatin (LIPITOR) 10 MG tablet TAKE 1 TABLET (10 MG TOTAL) BY MOUTH DAILY. 08/25/15   Rosalita Chessman, DO   calcium gluconate 500 MG tablet Take 500 mg by mouth daily.    Historical Provider, MD  clopidogrel (PLAVIX) 75 MG tablet TAKE 1 TABLET BY MOUTH EVERY DAY 06/25/15   Alferd Apa Lowne, DO  donepezil (ARICEPT) 23 MG TABS tablet Take 1 tablet (23 mg total) by mouth at bedtime. 06/25/15   Rosalita Chessman, DO  lisinopril (PRINIVIL,ZESTRIL) 10 MG tablet Take 10 mg by mouth daily.    Historical Provider, MD  lisinopril (PRINIVIL,ZESTRIL) 10 MG tablet TAKE 1 TABLET BY MOUTH EVERY DAY 08/25/15   Rosalita Chessman, DO  traMADol-acetaminophen (ULTRACET) 37.5-325 MG tablet TAKE 1 TABLET BY MOUTH EVEYR 6 HOURS NEEDED 09/04/15   Alferd Apa Lowne, DO   BP 182/77 mmHg  Pulse 58  Temp(Src) 97.8 F (36.6 C) (Oral)  Resp 17  Ht 5\' 8"  (1.727 m)  Wt 157 lb (71.215 kg)  BMI 23.88 kg/m2  SpO2 100% Physical Exam  Constitutional: She appears well-developed and well-nourished.  HENT:  Head: Normocephalic.  Patient has a very small 0.5 cm laceration to her left parietal area. There is no active bleeding. No underlying hematoma or bony tenderness.  Eyes: Pupils are equal, round, and reactive to light.  Neck:  Patient with c-collar in place. It's unclear  whether she has actual tenderness to her cervical spine or she doesn't want me touching her neck. But she does seem to have some discomfort on palpation of her cervical spine. There is no pain to her thoracic or lumbosacral spine.  Cardiovascular: Normal rate, regular rhythm and normal heart sounds.   Pulmonary/Chest: Effort normal and breath sounds normal. No respiratory distress. She has no wheezes. She has no rales. She exhibits no tenderness.  Abdominal: Soft. Bowel sounds are normal. There is no tenderness. There is no rebound and no guarding.  Musculoskeletal: Normal range of motion. She exhibits no edema.  She has discomfort on movement of both of her shoulders. There is no pain noted to the elbows or wrists. There is no pain on palpation or range of motion of the  lower extremities including the hips.  Lymphadenopathy:    She has no cervical adenopathy.  Neurological: She is alert.  She's awake and alert and is oriented to person and place but otherwise confused. She is moving all extremities symmetrically.  Skin: Skin is warm and dry. No rash noted.  Psychiatric: She has a normal mood and affect.    ED Course  Procedures (including critical care time) Labs Review Labs Reviewed - No data to display  Imaging Review Dg Shoulder Right  11/06/2015  CLINICAL DATA:  Recent fall with bilateral shoulder pain, initial encounter EXAM: RIGHT SHOULDER - 2+ VIEW COMPARISON:  None. FINDINGS: There is no evidence of fracture or dislocation. There is no evidence of arthropathy or other focal bone abnormality. Soft tissues are unremarkable. IMPRESSION: No acute abnormality noted. Electronically Signed   By: Inez Catalina M.D.   On: 11/06/2015 09:34   Ct Head Wo Contrast  11/06/2015  CLINICAL DATA:  Found next to bed on floor. Laceration to back of head. EXAM: CT HEAD WITHOUT CONTRAST CT CERVICAL SPINE WITHOUT CONTRAST TECHNIQUE: Multidetector CT imaging of the head and cervical spine was performed following the standard protocol without intravenous contrast. Multiplanar CT image reconstructions of the cervical spine were also generated. COMPARISON:  02/26/2015 FINDINGS: CT HEAD FINDINGS Prominence of the sulci and ventricles identified compatible with brain atrophy. There is mild diffuse low-attenuation within the subcortical and periventricular white matter compatible with chronic microvascular disease. The scratch set no evidence for acute brain infarct, intracranial hemorrhage or mass. The paranasal sinuses appear clear. The mastoid air cells are clear. The calvarium appears to be intact. CT CERVICAL SPINE FINDINGS Normal alignment of the cervical spine. The vertebral body heights are well preserved. Disc space narrowing and ventral endplate spurring is identified at  C5-6 and C6-7. The facet joints appear aligned. The prevertebral soft tissue space is normal. Atherosclerotic calcifications within the carotid arteries are visualized. Biapical scar like densities are noted. IMPRESSION: 1. No acute intracranial abnormalities. 2. Chronic microvascular disease and brain atrophy. 3. No evidence for cervical spine fracture. 4. Cervical degenerative disc disease. Electronically Signed   By: Kerby Moors M.D.   On: 11/06/2015 09:59   Ct Cervical Spine Wo Contrast  11/06/2015  CLINICAL DATA:  Found next to bed on floor. Laceration to back of head. EXAM: CT HEAD WITHOUT CONTRAST CT CERVICAL SPINE WITHOUT CONTRAST TECHNIQUE: Multidetector CT imaging of the head and cervical spine was performed following the standard protocol without intravenous contrast. Multiplanar CT image reconstructions of the cervical spine were also generated. COMPARISON:  02/26/2015 FINDINGS: CT HEAD FINDINGS Prominence of the sulci and ventricles identified compatible with brain atrophy. There is mild diffuse low-attenuation  within the subcortical and periventricular white matter compatible with chronic microvascular disease. The scratch set no evidence for acute brain infarct, intracranial hemorrhage or mass. The paranasal sinuses appear clear. The mastoid air cells are clear. The calvarium appears to be intact. CT CERVICAL SPINE FINDINGS Normal alignment of the cervical spine. The vertebral body heights are well preserved. Disc space narrowing and ventral endplate spurring is identified at C5-6 and C6-7. The facet joints appear aligned. The prevertebral soft tissue space is normal. Atherosclerotic calcifications within the carotid arteries are visualized. Biapical scar like densities are noted. IMPRESSION: 1. No acute intracranial abnormalities. 2. Chronic microvascular disease and brain atrophy. 3. No evidence for cervical spine fracture. 4. Cervical degenerative disc disease. Electronically Signed   By:  Kerby Moors M.D.   On: 11/06/2015 09:59   Dg Shoulder Left  11/06/2015  CLINICAL DATA:  Fall.  Bilateral shoulder pain. EXAM: LEFT SHOULDER - 2+ VIEW COMPARISON:  None. FINDINGS: The bones appear osteopenic. No fracture or subluxation identified. No radiopaque foreign body or soft tissue calcification. IMPRESSION: 1. No acute findings. 2. Osteopenia Electronically Signed   By: Kerby Moors M.D.   On: 11/06/2015 09:35   I have personally reviewed and evaluated these images and lab results as part of my medical decision-making.   EKG Interpretation None      MDM   Final diagnoses:  Scalp laceration, initial encounter  Shoulder pain, bilateral    Patient has no evidence of intracranial hemorrhage. There is no acute cervical fracture or subluxation. There is no other bony injuries noted. Patient seems to be at her baseline mental status. This seems to be a mechanical fall. There is no other indication of recent illnesses. She has a small laceration that does not appear to need suturing. This was cleaned in the ED. She was discharged back to the nursing facility.    Malvin Johns, MD 11/06/15 [phone]

## 2015-11-14 DIAGNOSIS — R278 Other lack of coordination: Secondary | ICD-10-CM | POA: Diagnosis not present

## 2015-11-14 DIAGNOSIS — M6281 Muscle weakness (generalized): Secondary | ICD-10-CM | POA: Diagnosis not present

## 2015-11-14 DIAGNOSIS — R262 Difficulty in walking, not elsewhere classified: Secondary | ICD-10-CM | POA: Diagnosis not present

## 2015-11-18 DIAGNOSIS — R262 Difficulty in walking, not elsewhere classified: Secondary | ICD-10-CM | POA: Diagnosis not present

## 2015-11-18 DIAGNOSIS — M6281 Muscle weakness (generalized): Secondary | ICD-10-CM | POA: Diagnosis not present

## 2015-11-18 DIAGNOSIS — R278 Other lack of coordination: Secondary | ICD-10-CM | POA: Diagnosis not present

## 2015-11-19 DIAGNOSIS — R278 Other lack of coordination: Secondary | ICD-10-CM | POA: Diagnosis not present

## 2015-11-19 DIAGNOSIS — M6281 Muscle weakness (generalized): Secondary | ICD-10-CM | POA: Diagnosis not present

## 2015-11-19 DIAGNOSIS — R262 Difficulty in walking, not elsewhere classified: Secondary | ICD-10-CM | POA: Diagnosis not present

## 2015-11-20 DIAGNOSIS — M6281 Muscle weakness (generalized): Secondary | ICD-10-CM | POA: Diagnosis not present

## 2015-11-20 DIAGNOSIS — R278 Other lack of coordination: Secondary | ICD-10-CM | POA: Diagnosis not present

## 2015-11-20 DIAGNOSIS — R262 Difficulty in walking, not elsewhere classified: Secondary | ICD-10-CM | POA: Diagnosis not present

## 2015-11-25 DIAGNOSIS — M6281 Muscle weakness (generalized): Secondary | ICD-10-CM | POA: Diagnosis not present

## 2015-11-25 DIAGNOSIS — R278 Other lack of coordination: Secondary | ICD-10-CM | POA: Diagnosis not present

## 2015-11-25 DIAGNOSIS — R262 Difficulty in walking, not elsewhere classified: Secondary | ICD-10-CM | POA: Diagnosis not present

## 2015-11-26 DIAGNOSIS — M6281 Muscle weakness (generalized): Secondary | ICD-10-CM | POA: Diagnosis not present

## 2015-11-26 DIAGNOSIS — R278 Other lack of coordination: Secondary | ICD-10-CM | POA: Diagnosis not present

## 2015-11-26 DIAGNOSIS — R262 Difficulty in walking, not elsewhere classified: Secondary | ICD-10-CM | POA: Diagnosis not present

## 2015-11-27 DIAGNOSIS — M6281 Muscle weakness (generalized): Secondary | ICD-10-CM | POA: Diagnosis not present

## 2015-11-27 DIAGNOSIS — R278 Other lack of coordination: Secondary | ICD-10-CM | POA: Diagnosis not present

## 2015-11-27 DIAGNOSIS — R262 Difficulty in walking, not elsewhere classified: Secondary | ICD-10-CM | POA: Diagnosis not present

## 2015-12-01 DIAGNOSIS — M6281 Muscle weakness (generalized): Secondary | ICD-10-CM | POA: Diagnosis not present

## 2015-12-01 DIAGNOSIS — R278 Other lack of coordination: Secondary | ICD-10-CM | POA: Diagnosis not present

## 2015-12-01 DIAGNOSIS — R262 Difficulty in walking, not elsewhere classified: Secondary | ICD-10-CM | POA: Diagnosis not present

## 2015-12-04 DIAGNOSIS — M6281 Muscle weakness (generalized): Secondary | ICD-10-CM | POA: Diagnosis not present

## 2015-12-04 DIAGNOSIS — R278 Other lack of coordination: Secondary | ICD-10-CM | POA: Diagnosis not present

## 2015-12-04 DIAGNOSIS — R262 Difficulty in walking, not elsewhere classified: Secondary | ICD-10-CM | POA: Diagnosis not present

## 2015-12-10 ENCOUNTER — Other Ambulatory Visit: Payer: Self-pay | Admitting: Family Medicine

## 2015-12-10 DIAGNOSIS — R488 Other symbolic dysfunctions: Secondary | ICD-10-CM | POA: Diagnosis not present

## 2015-12-10 DIAGNOSIS — M6281 Muscle weakness (generalized): Secondary | ICD-10-CM | POA: Diagnosis not present

## 2015-12-10 NOTE — Telephone Encounter (Signed)
Requesting: Tramadol Contract  None UDS  None Last OV  06/05/2015 Last Refill   #30 with 1 refills 09/04/2015  Please Advise

## 2015-12-11 DIAGNOSIS — M6281 Muscle weakness (generalized): Secondary | ICD-10-CM | POA: Diagnosis not present

## 2015-12-11 DIAGNOSIS — R488 Other symbolic dysfunctions: Secondary | ICD-10-CM | POA: Diagnosis not present

## 2015-12-15 ENCOUNTER — Telehealth: Payer: Self-pay | Admitting: *Deleted

## 2015-12-15 DIAGNOSIS — M6281 Muscle weakness (generalized): Secondary | ICD-10-CM | POA: Diagnosis not present

## 2015-12-15 DIAGNOSIS — R488 Other symbolic dysfunctions: Secondary | ICD-10-CM | POA: Diagnosis not present

## 2015-12-15 NOTE — Telephone Encounter (Signed)
Received fax with OT Plan of Care, attached billing sheet; forwarded to provider/SLS 01/30

## 2015-12-16 DIAGNOSIS — M6281 Muscle weakness (generalized): Secondary | ICD-10-CM | POA: Diagnosis not present

## 2015-12-16 DIAGNOSIS — R488 Other symbolic dysfunctions: Secondary | ICD-10-CM | POA: Diagnosis not present

## 2015-12-17 DIAGNOSIS — R262 Difficulty in walking, not elsewhere classified: Secondary | ICD-10-CM | POA: Diagnosis not present

## 2015-12-17 DIAGNOSIS — R488 Other symbolic dysfunctions: Secondary | ICD-10-CM | POA: Diagnosis not present

## 2015-12-17 DIAGNOSIS — R278 Other lack of coordination: Secondary | ICD-10-CM | POA: Diagnosis not present

## 2015-12-17 DIAGNOSIS — M6281 Muscle weakness (generalized): Secondary | ICD-10-CM | POA: Diagnosis not present

## 2015-12-18 DIAGNOSIS — R488 Other symbolic dysfunctions: Secondary | ICD-10-CM | POA: Diagnosis not present

## 2015-12-18 DIAGNOSIS — R262 Difficulty in walking, not elsewhere classified: Secondary | ICD-10-CM | POA: Diagnosis not present

## 2015-12-18 DIAGNOSIS — R278 Other lack of coordination: Secondary | ICD-10-CM | POA: Diagnosis not present

## 2015-12-18 DIAGNOSIS — M6281 Muscle weakness (generalized): Secondary | ICD-10-CM | POA: Diagnosis not present

## 2015-12-19 ENCOUNTER — Telehealth: Payer: Self-pay | Admitting: *Deleted

## 2015-12-19 DIAGNOSIS — R488 Other symbolic dysfunctions: Secondary | ICD-10-CM | POA: Diagnosis not present

## 2015-12-19 DIAGNOSIS — M6281 Muscle weakness (generalized): Secondary | ICD-10-CM | POA: Diagnosis not present

## 2015-12-19 DIAGNOSIS — R278 Other lack of coordination: Secondary | ICD-10-CM | POA: Diagnosis not present

## 2015-12-19 DIAGNOSIS — R262 Difficulty in walking, not elsewhere classified: Secondary | ICD-10-CM | POA: Diagnosis not present

## 2015-12-19 NOTE — Telephone Encounter (Signed)
Orders faxed, sent to scan/SLS 2/03

## 2015-12-19 NOTE — Telephone Encounter (Signed)
Received fax for PT Orders; forwarded to provider/SLS 02/03

## 2015-12-22 DIAGNOSIS — R488 Other symbolic dysfunctions: Secondary | ICD-10-CM | POA: Diagnosis not present

## 2015-12-22 DIAGNOSIS — R262 Difficulty in walking, not elsewhere classified: Secondary | ICD-10-CM | POA: Diagnosis not present

## 2015-12-22 DIAGNOSIS — M6281 Muscle weakness (generalized): Secondary | ICD-10-CM | POA: Diagnosis not present

## 2015-12-22 DIAGNOSIS — R278 Other lack of coordination: Secondary | ICD-10-CM | POA: Diagnosis not present

## 2015-12-23 DIAGNOSIS — R488 Other symbolic dysfunctions: Secondary | ICD-10-CM | POA: Diagnosis not present

## 2015-12-23 DIAGNOSIS — R278 Other lack of coordination: Secondary | ICD-10-CM | POA: Diagnosis not present

## 2015-12-23 DIAGNOSIS — M6281 Muscle weakness (generalized): Secondary | ICD-10-CM | POA: Diagnosis not present

## 2015-12-23 DIAGNOSIS — R262 Difficulty in walking, not elsewhere classified: Secondary | ICD-10-CM | POA: Diagnosis not present

## 2015-12-24 DIAGNOSIS — M6281 Muscle weakness (generalized): Secondary | ICD-10-CM | POA: Diagnosis not present

## 2015-12-24 DIAGNOSIS — R488 Other symbolic dysfunctions: Secondary | ICD-10-CM | POA: Diagnosis not present

## 2015-12-24 DIAGNOSIS — R262 Difficulty in walking, not elsewhere classified: Secondary | ICD-10-CM | POA: Diagnosis not present

## 2015-12-24 DIAGNOSIS — R278 Other lack of coordination: Secondary | ICD-10-CM | POA: Diagnosis not present

## 2015-12-25 DIAGNOSIS — R278 Other lack of coordination: Secondary | ICD-10-CM | POA: Diagnosis not present

## 2015-12-25 DIAGNOSIS — R262 Difficulty in walking, not elsewhere classified: Secondary | ICD-10-CM | POA: Diagnosis not present

## 2015-12-25 DIAGNOSIS — M6281 Muscle weakness (generalized): Secondary | ICD-10-CM | POA: Diagnosis not present

## 2015-12-25 DIAGNOSIS — R488 Other symbolic dysfunctions: Secondary | ICD-10-CM | POA: Diagnosis not present

## 2015-12-26 ENCOUNTER — Telehealth: Payer: Self-pay | Admitting: *Deleted

## 2015-12-26 DIAGNOSIS — R488 Other symbolic dysfunctions: Secondary | ICD-10-CM | POA: Diagnosis not present

## 2015-12-26 DIAGNOSIS — R278 Other lack of coordination: Secondary | ICD-10-CM | POA: Diagnosis not present

## 2015-12-26 DIAGNOSIS — M6281 Muscle weakness (generalized): Secondary | ICD-10-CM | POA: Diagnosis not present

## 2015-12-26 DIAGNOSIS — R262 Difficulty in walking, not elsewhere classified: Secondary | ICD-10-CM | POA: Diagnosis not present

## 2015-12-26 NOTE — Telephone Encounter (Signed)
Received PT Plan of Care; forwarded to provider/SLS 02/10

## 2015-12-29 NOTE — Telephone Encounter (Signed)
Faxed, sent to scan/SLS 02/10 

## 2015-12-30 DIAGNOSIS — R488 Other symbolic dysfunctions: Secondary | ICD-10-CM | POA: Diagnosis not present

## 2015-12-30 DIAGNOSIS — R262 Difficulty in walking, not elsewhere classified: Secondary | ICD-10-CM | POA: Diagnosis not present

## 2015-12-30 DIAGNOSIS — R278 Other lack of coordination: Secondary | ICD-10-CM | POA: Diagnosis not present

## 2015-12-30 DIAGNOSIS — M6281 Muscle weakness (generalized): Secondary | ICD-10-CM | POA: Diagnosis not present

## 2015-12-31 DIAGNOSIS — R262 Difficulty in walking, not elsewhere classified: Secondary | ICD-10-CM | POA: Diagnosis not present

## 2015-12-31 DIAGNOSIS — R488 Other symbolic dysfunctions: Secondary | ICD-10-CM | POA: Diagnosis not present

## 2015-12-31 DIAGNOSIS — M6281 Muscle weakness (generalized): Secondary | ICD-10-CM | POA: Diagnosis not present

## 2015-12-31 DIAGNOSIS — R278 Other lack of coordination: Secondary | ICD-10-CM | POA: Diagnosis not present

## 2016-01-02 DIAGNOSIS — M6281 Muscle weakness (generalized): Secondary | ICD-10-CM | POA: Diagnosis not present

## 2016-01-02 DIAGNOSIS — R488 Other symbolic dysfunctions: Secondary | ICD-10-CM | POA: Diagnosis not present

## 2016-01-02 DIAGNOSIS — R262 Difficulty in walking, not elsewhere classified: Secondary | ICD-10-CM | POA: Diagnosis not present

## 2016-01-02 DIAGNOSIS — R278 Other lack of coordination: Secondary | ICD-10-CM | POA: Diagnosis not present

## 2016-01-05 DIAGNOSIS — R278 Other lack of coordination: Secondary | ICD-10-CM | POA: Diagnosis not present

## 2016-01-05 DIAGNOSIS — M6281 Muscle weakness (generalized): Secondary | ICD-10-CM | POA: Diagnosis not present

## 2016-01-05 DIAGNOSIS — R488 Other symbolic dysfunctions: Secondary | ICD-10-CM | POA: Diagnosis not present

## 2016-01-05 DIAGNOSIS — R262 Difficulty in walking, not elsewhere classified: Secondary | ICD-10-CM | POA: Diagnosis not present

## 2016-01-06 DIAGNOSIS — R278 Other lack of coordination: Secondary | ICD-10-CM | POA: Diagnosis not present

## 2016-01-06 DIAGNOSIS — M6281 Muscle weakness (generalized): Secondary | ICD-10-CM | POA: Diagnosis not present

## 2016-01-06 DIAGNOSIS — R488 Other symbolic dysfunctions: Secondary | ICD-10-CM | POA: Diagnosis not present

## 2016-01-06 DIAGNOSIS — R262 Difficulty in walking, not elsewhere classified: Secondary | ICD-10-CM | POA: Diagnosis not present

## 2016-01-07 DIAGNOSIS — M6281 Muscle weakness (generalized): Secondary | ICD-10-CM | POA: Diagnosis not present

## 2016-01-07 DIAGNOSIS — R262 Difficulty in walking, not elsewhere classified: Secondary | ICD-10-CM | POA: Diagnosis not present

## 2016-01-07 DIAGNOSIS — R278 Other lack of coordination: Secondary | ICD-10-CM | POA: Diagnosis not present

## 2016-01-07 DIAGNOSIS — R488 Other symbolic dysfunctions: Secondary | ICD-10-CM | POA: Diagnosis not present

## 2016-01-08 ENCOUNTER — Emergency Department (HOSPITAL_COMMUNITY)
Admission: EM | Admit: 2016-01-08 | Discharge: 2016-01-08 | Disposition: A | Discharge status: Home / Self Care | Payer: Medicare Other | Attending: Emergency Medicine | Admitting: Emergency Medicine

## 2016-01-08 ENCOUNTER — Emergency Department (HOSPITAL_COMMUNITY): Payer: Medicare Other

## 2016-01-08 ENCOUNTER — Encounter (HOSPITAL_COMMUNITY): Payer: Self-pay | Admitting: *Deleted

## 2016-01-08 DIAGNOSIS — M1990 Unspecified osteoarthritis, unspecified site: Secondary | ICD-10-CM | POA: Insufficient documentation

## 2016-01-08 DIAGNOSIS — F0390 Unspecified dementia without behavioral disturbance: Secondary | ICD-10-CM | POA: Insufficient documentation

## 2016-01-08 DIAGNOSIS — Z043 Encounter for examination and observation following other accident: Secondary | ICD-10-CM | POA: Diagnosis not present

## 2016-01-08 DIAGNOSIS — Y998 Other external cause status: Secondary | ICD-10-CM | POA: Insufficient documentation

## 2016-01-08 DIAGNOSIS — Z8719 Personal history of other diseases of the digestive system: Secondary | ICD-10-CM | POA: Diagnosis not present

## 2016-01-08 DIAGNOSIS — Z8541 Personal history of malignant neoplasm of cervix uteri: Secondary | ICD-10-CM | POA: Diagnosis not present

## 2016-01-08 DIAGNOSIS — E785 Hyperlipidemia, unspecified: Secondary | ICD-10-CM | POA: Diagnosis not present

## 2016-01-08 DIAGNOSIS — Z79899 Other long term (current) drug therapy: Secondary | ICD-10-CM | POA: Diagnosis not present

## 2016-01-08 DIAGNOSIS — W1839XA Other fall on same level, initial encounter: Secondary | ICD-10-CM | POA: Insufficient documentation

## 2016-01-08 DIAGNOSIS — Z85828 Personal history of other malignant neoplasm of skin: Secondary | ICD-10-CM | POA: Diagnosis not present

## 2016-01-08 DIAGNOSIS — R404 Transient alteration of awareness: Secondary | ICD-10-CM | POA: Diagnosis not present

## 2016-01-08 DIAGNOSIS — Z8669 Personal history of other diseases of the nervous system and sense organs: Secondary | ICD-10-CM | POA: Diagnosis not present

## 2016-01-08 DIAGNOSIS — W19XXXA Unspecified fall, initial encounter: Secondary | ICD-10-CM

## 2016-01-08 DIAGNOSIS — R259 Unspecified abnormal involuntary movements: Secondary | ICD-10-CM | POA: Diagnosis not present

## 2016-01-08 DIAGNOSIS — I10 Essential (primary) hypertension: Secondary | ICD-10-CM | POA: Diagnosis not present

## 2016-01-08 DIAGNOSIS — Y9289 Other specified places as the place of occurrence of the external cause: Secondary | ICD-10-CM | POA: Insufficient documentation

## 2016-01-08 DIAGNOSIS — Y9389 Activity, other specified: Secondary | ICD-10-CM | POA: Diagnosis not present

## 2016-01-08 DIAGNOSIS — Z8673 Personal history of transient ischemic attack (TIA), and cerebral infarction without residual deficits: Secondary | ICD-10-CM | POA: Insufficient documentation

## 2016-01-08 DIAGNOSIS — R55 Syncope and collapse: Secondary | ICD-10-CM | POA: Diagnosis not present

## 2016-01-08 LAB — CBC WITH DIFFERENTIAL/PLATELET
BASOS PCT: 0 %
Basophils Absolute: 0 10*3/uL (ref 0.0–0.1)
EOS ABS: 0 10*3/uL (ref 0.0–0.7)
Eosinophils Relative: 1 %
HEMATOCRIT: 44.5 % (ref 36.0–46.0)
HEMOGLOBIN: 14.6 g/dL (ref 12.0–15.0)
Lymphocytes Relative: 17 %
Lymphs Abs: 1.2 10*3/uL (ref 0.7–4.0)
MCH: 31.6 pg (ref 26.0–34.0)
MCHC: 32.8 g/dL (ref 30.0–36.0)
MCV: 96.3 fL (ref 78.0–100.0)
Monocytes Absolute: 0.7 10*3/uL (ref 0.1–1.0)
Monocytes Relative: 10 %
NEUTROS ABS: 5.2 10*3/uL (ref 1.7–7.7)
NEUTROS PCT: 72 %
Platelets: 216 10*3/uL (ref 150–400)
RBC: 4.62 MIL/uL (ref 3.87–5.11)
RDW: 13.1 % (ref 11.5–15.5)
WBC: 7.2 10*3/uL (ref 4.0–10.5)

## 2016-01-08 LAB — COMPREHENSIVE METABOLIC PANEL
ALBUMIN: 4.2 g/dL (ref 3.5–5.0)
ALK PHOS: 46 U/L (ref 38–126)
ALT: 11 U/L — AB (ref 14–54)
ANION GAP: 7 (ref 5–15)
AST: 18 U/L (ref 15–41)
BILIRUBIN TOTAL: 1.4 mg/dL — AB (ref 0.3–1.2)
BUN: 16 mg/dL (ref 6–20)
CALCIUM: 9.3 mg/dL (ref 8.9–10.3)
CO2: 29 mmol/L (ref 22–32)
CREATININE: 0.85 mg/dL (ref 0.44–1.00)
Chloride: 105 mmol/L (ref 101–111)
GFR calc Af Amer: >60 mL/min (ref >60)
GFR calc non Af Amer: >60 mL/min (ref >60)
GLUCOSE: 101 mg/dL — AB (ref 65–99)
Potassium: 4.2 mmol/L (ref 3.5–5.1)
SODIUM: 141 mmol/L (ref 135–145)
Total Protein: 6.5 g/dL (ref 6.5–8.1)

## 2016-01-08 LAB — I-STAT TROPONIN, ED: Troponin i, poc: 0 ng/mL (ref 0.00–0.08)

## 2016-01-08 MED ORDER — SODIUM CHLORIDE 0.9 % IV BOLUS (SEPSIS): 500.0000 mL | Freq: Once | INTRAVENOUS | Status: DC

## 2016-01-08 NOTE — ED Notes (Signed)
Patient continues to refuse any care, including catheter, IV and IVF and Labs

## 2016-01-08 NOTE — ED Notes (Signed)
Patient's son [name] called- he is 2 hours away. Please call him if we need him. [phone]

## 2016-01-08 NOTE — ED Notes (Signed)
Patient arrived from [facility] via EMS. Patient had a near syncopal episode this morning.

## 2016-01-08 NOTE — ED Notes (Signed)
Patient notified of need for IV and labs drawn. Patient refused. MD notified.

## 2016-01-08 NOTE — ED Notes (Signed)
Bed: WA07 Expected date:  Expected time:  Means of arrival:  Comments: EMS syncope 

## 2016-01-08 NOTE — ED Notes (Signed)
[facility] called  

## 2016-01-08 NOTE — Discharge Instructions (Signed)
Follow up with family md as needed

## 2016-01-08 NOTE — ED Provider Notes (Signed)
CSN: [id number]     Arrival date & time 01/08/16  [id number] History   First MD Initiated Contact with Patient 01/08/16 1041     Chief Complaint  Patient presents with  . Near Syncope     (Consider location/radiation/quality/duration/timing/severity/associated sxs/prior Treatment) Patient is a 82 y.o. female presenting with near-syncope. The history is provided by the patient (Patient fell at the assisted living. Unwitnessed fall. Patient has dementia and cannot tell you anything about the fall).  Near Syncope This is a new problem. The current episode started 12 to 24 hours ago. The problem occurs rarely. The problem has been resolved. Pertinent negatives include no chest pain and no abdominal pain. Nothing aggravates the symptoms. Nothing relieves the symptoms.    Past Medical History  Diagnosis Date  . Hypertension   . Hyperlipidemia   . Insomnia   . Dementia   . Hepatitis     not sure which  . Stroke [facility]) 2015    denies residual on 08/08/2015  . Arthritis     "hands" (08/08/2015)  . Depression   . Uterine cancer ([place])   . Skin cancer     "don't know what kind or where; it's in my skin" (08/08/2015)   Past Surgical History  Procedure Laterality Date  . Appendectomy    . Tonsillectomy     Family History  Problem Relation Age of Onset  . Stroke Father    Social History  Substance Use Topics  . Smoking status: Never Smoker   . Smokeless tobacco: Never Used  . Alcohol Use: No   OB History    No data available     Review of Systems  Unable to perform ROS: Dementia  Cardiovascular: Positive for near-syncope. Negative for chest pain.  Gastrointestinal: Negative for abdominal pain.      Allergies  Review of patient's allergies indicates no known allergies.  Home Medications   Prior to Admission medications   Medication Sig Start Date End Date Taking? Authorizing Provider  donepezil (ARICEPT) 23 MG TABS tablet Take 1 tablet (23 mg total) by mouth at bedtime.  06/25/15  Yes Yvonne R Lowne, DO  atorvastatin (LIPITOR) 10 MG tablet TAKE 1 TABLET (10 MG TOTAL) BY MOUTH DAILY. Patient not taking: Reported on 01/08/2016 08/25/15   Rosalita Chessman, DO  clopidogrel (PLAVIX) 75 MG tablet TAKE 1 TABLET BY MOUTH EVERY DAY Patient not taking: Reported on 01/08/2016 06/25/15   Rosalita Chessman, DO  lisinopril (PRINIVIL,ZESTRIL) 10 MG tablet TAKE 1 TABLET BY MOUTH EVERY DAY Patient not taking: Reported on 01/08/2016 08/25/15   Rosalita Chessman, DO  traMADol-acetaminophen (ULTRACET) 37.5-325 MG tablet TAKE 1 TABLET BY MOUTH EVERY 6 HOURS AS NEEDED Patient not taking: Reported on 01/08/2016 12/11/15   Alferd Apa Lowne, DO   BP 161/106 mmHg  Pulse 67  Temp(Src) 97 F (36.1 C) (Rectal)  Resp 18  SpO2 98% Physical Exam  Constitutional: She appears well-developed.  HENT:  Head: Normocephalic.  Eyes: Conjunctivae and EOM are normal. No scleral icterus.  Neck: Neck supple. No thyromegaly present.  Cardiovascular: Normal rate and regular rhythm.  Exam reveals no gallop and no friction rub.   No murmur heard. Pulmonary/Chest: No stridor. She has no wheezes. She has no rales. She exhibits no tenderness.  Abdominal: She exhibits no distension. There is no tenderness. There is no rebound.  Musculoskeletal: Normal range of motion. She exhibits no edema.  Lymphadenopathy:    She has no cervical adenopathy.  Neurological: She  is alert. She exhibits normal muscle tone. Coordination normal.  Oriented to person only  Skin: No rash noted. No erythema.    ED Course  Procedures (including critical care time) Labs Review Labs Reviewed  COMPREHENSIVE METABOLIC PANEL - Abnormal; Notable for the following:    Glucose, Bld 101 (*)    ALT 11 (*)    Total Bilirubin 1.4 (*)    All other components within normal limits  CBC WITH DIFFERENTIAL/PLATELET  Randolm Idol, ED    Imaging Review Dg Chest 2 View  01/08/2016  CLINICAL DATA:  Syncope this morning, no fall, weakness EXAM:  CHEST  2 VIEW COMPARISON:  08/08/2015 FINDINGS: Cardiomediastinal silhouette is stable. No acute infiltrate or pleural effusion. No pulmonary edema. Mild thoracic spine osteopenia. Atherosclerotic calcifications of thoracic aorta. IMPRESSION: No active cardiopulmonary disease.  Mild thoracic spine osteopenia. Electronically Signed   By: Lahoma Crocker M.D.   On: 01/08/2016 11:43   Dg Pelvis 1-2 Views  01/08/2016  CLINICAL DATA:  Syncopal episode this morning. History of dementia. No reported fall. EXAM: PELVIS - 1-2 VIEW COMPARISON:  Lumbar spine radiographs 02/26/2015. Pelvic CT 07/12/2013. FINDINGS: The bones are diffusely demineralized. There is no evidence of acute fracture or dislocation. No evidence of femoral head avascular necrosis. The hip joint spaces are maintained. Vascular calcifications and prominent skin folds are noted. IMPRESSION: No acute pelvic findings or significant arthropathic changes. Osteopenia. Electronically Signed   By: Richardean Sale M.D.   On: 01/08/2016 11:43   Ct Head Wo Contrast  01/08/2016  CLINICAL DATA:  Near syncopal episode this morning. Weakness. History of dementia. EXAM: CT HEAD WITHOUT CONTRAST TECHNIQUE: Contiguous axial images were obtained from the base of the skull through the vertex without intravenous contrast. COMPARISON:  CT head 11/04/2015. FINDINGS: Brain: There is no evidence of acute intracranial hemorrhage, mass lesion, brain edema or extra-axial fluid collection. The ventricles and subarachnoid spaces are diffusely prominent but stable. There is no CT evidence of acute cortical infarction. There is extensive periventricular white matter disease which appears unchanged. Intracranial vascular calcifications are noted. Bones/sinuses/visualized face: The visualized paranasal sinuses, mastoid air cells and middle ears are clear. The calvarium is intact. IMPRESSION: Stable examination with atrophy and extensive chronic periventricular white matter disease. No  acute intracranial findings. Electronically Signed   By: Richardean Sale M.D.   On: 01/08/2016 11:39   I have personally reviewed and evaluated these images and lab results as part of my medical decision-making.   EKG Interpretation   Date/Time:  Thursday January 08 2016 11:07:28 EST Ventricular Rate:  50 PR Interval:  165 QRS Duration: 87 QT Interval:  444 QTC Calculation: 405 R Axis:   74 Text Interpretation:  Sinus rhythm Probable anteroseptal infarct, old  Confirmed by Jakoby Melendrez  MD, Drystan Reader [phone]) on 01/08/2016 3:27:59 PM      MDM   Final diagnoses:  Fall, initial encounter    Labs and x-rays were unremarkable. Including CBC cemented troponin.  CT of the head and cervical spine shows no trauma. Patient will follow-up with her PCP    Milton Ferguson, MD 01/08/16 [phone]

## 2016-01-12 DIAGNOSIS — R262 Difficulty in walking, not elsewhere classified: Secondary | ICD-10-CM | POA: Diagnosis not present

## 2016-01-12 DIAGNOSIS — R278 Other lack of coordination: Secondary | ICD-10-CM | POA: Diagnosis not present

## 2016-01-12 DIAGNOSIS — M6281 Muscle weakness (generalized): Secondary | ICD-10-CM | POA: Diagnosis not present

## 2016-01-12 DIAGNOSIS — R488 Other symbolic dysfunctions: Secondary | ICD-10-CM | POA: Diagnosis not present

## 2016-01-13 DIAGNOSIS — R262 Difficulty in walking, not elsewhere classified: Secondary | ICD-10-CM | POA: Diagnosis not present

## 2016-01-13 DIAGNOSIS — R488 Other symbolic dysfunctions: Secondary | ICD-10-CM | POA: Diagnosis not present

## 2016-01-13 DIAGNOSIS — R278 Other lack of coordination: Secondary | ICD-10-CM | POA: Diagnosis not present

## 2016-01-13 DIAGNOSIS — M6281 Muscle weakness (generalized): Secondary | ICD-10-CM | POA: Diagnosis not present

## 2016-01-14 DIAGNOSIS — R262 Difficulty in walking, not elsewhere classified: Secondary | ICD-10-CM | POA: Diagnosis not present

## 2016-01-14 DIAGNOSIS — R278 Other lack of coordination: Secondary | ICD-10-CM | POA: Diagnosis not present

## 2016-01-14 DIAGNOSIS — M6281 Muscle weakness (generalized): Secondary | ICD-10-CM | POA: Diagnosis not present

## 2016-01-14 DIAGNOSIS — R488 Other symbolic dysfunctions: Secondary | ICD-10-CM | POA: Diagnosis not present

## 2016-01-15 DIAGNOSIS — R262 Difficulty in walking, not elsewhere classified: Secondary | ICD-10-CM | POA: Diagnosis not present

## 2016-01-15 DIAGNOSIS — R278 Other lack of coordination: Secondary | ICD-10-CM | POA: Diagnosis not present

## 2016-01-15 DIAGNOSIS — R488 Other symbolic dysfunctions: Secondary | ICD-10-CM | POA: Diagnosis not present

## 2016-01-15 DIAGNOSIS — M6281 Muscle weakness (generalized): Secondary | ICD-10-CM | POA: Diagnosis not present

## 2016-01-19 DIAGNOSIS — R278 Other lack of coordination: Secondary | ICD-10-CM | POA: Diagnosis not present

## 2016-01-19 DIAGNOSIS — M6281 Muscle weakness (generalized): Secondary | ICD-10-CM | POA: Diagnosis not present

## 2016-01-19 DIAGNOSIS — R262 Difficulty in walking, not elsewhere classified: Secondary | ICD-10-CM | POA: Diagnosis not present

## 2016-01-19 DIAGNOSIS — R488 Other symbolic dysfunctions: Secondary | ICD-10-CM | POA: Diagnosis not present

## 2016-01-21 DIAGNOSIS — M6281 Muscle weakness (generalized): Secondary | ICD-10-CM | POA: Diagnosis not present

## 2016-01-21 DIAGNOSIS — R262 Difficulty in walking, not elsewhere classified: Secondary | ICD-10-CM | POA: Diagnosis not present

## 2016-01-21 DIAGNOSIS — R488 Other symbolic dysfunctions: Secondary | ICD-10-CM | POA: Diagnosis not present

## 2016-01-21 DIAGNOSIS — R278 Other lack of coordination: Secondary | ICD-10-CM | POA: Diagnosis not present

## 2016-01-22 DIAGNOSIS — R262 Difficulty in walking, not elsewhere classified: Secondary | ICD-10-CM | POA: Diagnosis not present

## 2016-01-22 DIAGNOSIS — R488 Other symbolic dysfunctions: Secondary | ICD-10-CM | POA: Diagnosis not present

## 2016-01-22 DIAGNOSIS — M6281 Muscle weakness (generalized): Secondary | ICD-10-CM | POA: Diagnosis not present

## 2016-01-22 DIAGNOSIS — R278 Other lack of coordination: Secondary | ICD-10-CM | POA: Diagnosis not present

## 2016-01-23 DIAGNOSIS — R488 Other symbolic dysfunctions: Secondary | ICD-10-CM | POA: Diagnosis not present

## 2016-01-23 DIAGNOSIS — R262 Difficulty in walking, not elsewhere classified: Secondary | ICD-10-CM | POA: Diagnosis not present

## 2016-01-23 DIAGNOSIS — R278 Other lack of coordination: Secondary | ICD-10-CM | POA: Diagnosis not present

## 2016-01-23 DIAGNOSIS — M6281 Muscle weakness (generalized): Secondary | ICD-10-CM | POA: Diagnosis not present

## 2016-01-26 ENCOUNTER — Telehealth: Payer: Self-pay | Admitting: *Deleted

## 2016-01-26 DIAGNOSIS — R488 Other symbolic dysfunctions: Secondary | ICD-10-CM | POA: Diagnosis not present

## 2016-01-26 DIAGNOSIS — R278 Other lack of coordination: Secondary | ICD-10-CM | POA: Diagnosis not present

## 2016-01-26 DIAGNOSIS — R262 Difficulty in walking, not elsewhere classified: Secondary | ICD-10-CM | POA: Diagnosis not present

## 2016-01-26 DIAGNOSIS — M6281 Muscle weakness (generalized): Secondary | ICD-10-CM | POA: Diagnosis not present

## 2016-01-26 NOTE — Telephone Encounter (Signed)
Received OT Plan of Care and Order for [facility]; forwarded to provider/SLS 03/13

## 2016-01-27 DIAGNOSIS — R262 Difficulty in walking, not elsewhere classified: Secondary | ICD-10-CM | POA: Diagnosis not present

## 2016-01-27 DIAGNOSIS — R488 Other symbolic dysfunctions: Secondary | ICD-10-CM | POA: Diagnosis not present

## 2016-01-27 DIAGNOSIS — M6281 Muscle weakness (generalized): Secondary | ICD-10-CM | POA: Diagnosis not present

## 2016-01-27 DIAGNOSIS — R278 Other lack of coordination: Secondary | ICD-10-CM | POA: Diagnosis not present

## 2016-01-28 ENCOUNTER — Telehealth: Payer: Self-pay | Admitting: *Deleted

## 2016-01-28 DIAGNOSIS — R262 Difficulty in walking, not elsewhere classified: Secondary | ICD-10-CM | POA: Diagnosis not present

## 2016-01-28 DIAGNOSIS — M6281 Muscle weakness (generalized): Secondary | ICD-10-CM | POA: Diagnosis not present

## 2016-01-28 DIAGNOSIS — R278 Other lack of coordination: Secondary | ICD-10-CM | POA: Diagnosis not present

## 2016-01-28 DIAGNOSIS — R488 Other symbolic dysfunctions: Secondary | ICD-10-CM | POA: Diagnosis not present

## 2016-01-28 NOTE — Telephone Encounter (Signed)
Received PT-Therapist Progress & Updated Plan of Care; forwarded to provider/SLS

## 2016-01-29 DIAGNOSIS — R278 Other lack of coordination: Secondary | ICD-10-CM | POA: Diagnosis not present

## 2016-01-29 DIAGNOSIS — R262 Difficulty in walking, not elsewhere classified: Secondary | ICD-10-CM | POA: Diagnosis not present

## 2016-01-29 DIAGNOSIS — R488 Other symbolic dysfunctions: Secondary | ICD-10-CM | POA: Diagnosis not present

## 2016-01-29 DIAGNOSIS — M6281 Muscle weakness (generalized): Secondary | ICD-10-CM | POA: Diagnosis not present

## 2016-01-30 ENCOUNTER — Telehealth: Payer: Self-pay | Admitting: *Deleted

## 2016-01-30 DIAGNOSIS — R262 Difficulty in walking, not elsewhere classified: Secondary | ICD-10-CM | POA: Diagnosis not present

## 2016-01-30 DIAGNOSIS — M6281 Muscle weakness (generalized): Secondary | ICD-10-CM | POA: Diagnosis not present

## 2016-01-30 DIAGNOSIS — R488 Other symbolic dysfunctions: Secondary | ICD-10-CM | POA: Diagnosis not present

## 2016-01-30 DIAGNOSIS — R278 Other lack of coordination: Secondary | ICD-10-CM | POA: Diagnosis not present

## 2016-01-30 NOTE — Telephone Encounter (Signed)
Received Medical NecessityOrder for Wheelchair; forwarded to provider/SLS 03/17

## 2016-02-03 DIAGNOSIS — M6281 Muscle weakness (generalized): Secondary | ICD-10-CM | POA: Diagnosis not present

## 2016-02-03 DIAGNOSIS — R488 Other symbolic dysfunctions: Secondary | ICD-10-CM | POA: Diagnosis not present

## 2016-02-03 DIAGNOSIS — R262 Difficulty in walking, not elsewhere classified: Secondary | ICD-10-CM | POA: Diagnosis not present

## 2016-02-03 DIAGNOSIS — R278 Other lack of coordination: Secondary | ICD-10-CM | POA: Diagnosis not present

## 2016-02-04 DIAGNOSIS — M6281 Muscle weakness (generalized): Secondary | ICD-10-CM | POA: Diagnosis not present

## 2016-02-04 DIAGNOSIS — R488 Other symbolic dysfunctions: Secondary | ICD-10-CM | POA: Diagnosis not present

## 2016-02-04 DIAGNOSIS — R262 Difficulty in walking, not elsewhere classified: Secondary | ICD-10-CM | POA: Diagnosis not present

## 2016-02-04 DIAGNOSIS — R278 Other lack of coordination: Secondary | ICD-10-CM | POA: Diagnosis not present

## 2016-02-05 ENCOUNTER — Other Ambulatory Visit: Payer: Self-pay | Admitting: Family Medicine

## 2016-02-05 DIAGNOSIS — R262 Difficulty in walking, not elsewhere classified: Secondary | ICD-10-CM | POA: Diagnosis not present

## 2016-02-05 DIAGNOSIS — M6281 Muscle weakness (generalized): Secondary | ICD-10-CM | POA: Diagnosis not present

## 2016-02-05 DIAGNOSIS — R278 Other lack of coordination: Secondary | ICD-10-CM | POA: Diagnosis not present

## 2016-02-05 DIAGNOSIS — R488 Other symbolic dysfunctions: Secondary | ICD-10-CM | POA: Diagnosis not present

## 2016-02-06 DIAGNOSIS — M6281 Muscle weakness (generalized): Secondary | ICD-10-CM | POA: Diagnosis not present

## 2016-02-06 DIAGNOSIS — R488 Other symbolic dysfunctions: Secondary | ICD-10-CM | POA: Diagnosis not present

## 2016-02-06 DIAGNOSIS — R278 Other lack of coordination: Secondary | ICD-10-CM | POA: Diagnosis not present

## 2016-02-06 DIAGNOSIS — R262 Difficulty in walking, not elsewhere classified: Secondary | ICD-10-CM | POA: Diagnosis not present

## 2016-02-09 NOTE — Telephone Encounter (Signed)
Received second set of Orders for Wheelchair with lines that needed to be signed again; forwarded to provider/SLS 03/27

## 2016-02-10 DIAGNOSIS — R488 Other symbolic dysfunctions: Secondary | ICD-10-CM | POA: Diagnosis not present

## 2016-02-10 DIAGNOSIS — R262 Difficulty in walking, not elsewhere classified: Secondary | ICD-10-CM | POA: Diagnosis not present

## 2016-02-10 DIAGNOSIS — M6281 Muscle weakness (generalized): Secondary | ICD-10-CM | POA: Diagnosis not present

## 2016-02-10 DIAGNOSIS — R278 Other lack of coordination: Secondary | ICD-10-CM | POA: Diagnosis not present

## 2016-02-10 NOTE — Telephone Encounter (Signed)
Caller name: Corene Cornea, Customer Service, [facility] Can be reached: [phone] Fax# [phone]  Reason for call: need to refax [facility] for Wheelchair, please add provider NPI and resend asap. He can resend it if needed.

## 2016-02-11 DIAGNOSIS — M6281 Muscle weakness (generalized): Secondary | ICD-10-CM | POA: Diagnosis not present

## 2016-02-11 DIAGNOSIS — R488 Other symbolic dysfunctions: Secondary | ICD-10-CM | POA: Diagnosis not present

## 2016-02-11 DIAGNOSIS — R262 Difficulty in walking, not elsewhere classified: Secondary | ICD-10-CM | POA: Diagnosis not present

## 2016-02-11 DIAGNOSIS — R278 Other lack of coordination: Secondary | ICD-10-CM | POA: Diagnosis not present

## 2016-02-12 DIAGNOSIS — R278 Other lack of coordination: Secondary | ICD-10-CM | POA: Diagnosis not present

## 2016-02-12 DIAGNOSIS — R488 Other symbolic dysfunctions: Secondary | ICD-10-CM | POA: Diagnosis not present

## 2016-02-12 DIAGNOSIS — R262 Difficulty in walking, not elsewhere classified: Secondary | ICD-10-CM | POA: Diagnosis not present

## 2016-02-12 DIAGNOSIS — M6281 Muscle weakness (generalized): Secondary | ICD-10-CM | POA: Diagnosis not present

## 2016-02-13 DIAGNOSIS — R262 Difficulty in walking, not elsewhere classified: Secondary | ICD-10-CM | POA: Diagnosis not present

## 2016-02-13 DIAGNOSIS — M6281 Muscle weakness (generalized): Secondary | ICD-10-CM | POA: Diagnosis not present

## 2016-02-13 DIAGNOSIS — R278 Other lack of coordination: Secondary | ICD-10-CM | POA: Diagnosis not present

## 2016-02-13 DIAGNOSIS — R488 Other symbolic dysfunctions: Secondary | ICD-10-CM | POA: Diagnosis not present

## 2016-02-17 DIAGNOSIS — M6281 Muscle weakness (generalized): Secondary | ICD-10-CM | POA: Diagnosis not present

## 2016-02-17 DIAGNOSIS — R488 Other symbolic dysfunctions: Secondary | ICD-10-CM | POA: Diagnosis not present

## 2016-02-17 DIAGNOSIS — R262 Difficulty in walking, not elsewhere classified: Secondary | ICD-10-CM | POA: Diagnosis not present

## 2016-02-17 DIAGNOSIS — R278 Other lack of coordination: Secondary | ICD-10-CM | POA: Diagnosis not present

## 2016-02-19 DIAGNOSIS — M6281 Muscle weakness (generalized): Secondary | ICD-10-CM | POA: Diagnosis not present

## 2016-02-19 DIAGNOSIS — R262 Difficulty in walking, not elsewhere classified: Secondary | ICD-10-CM | POA: Diagnosis not present

## 2016-02-19 DIAGNOSIS — R278 Other lack of coordination: Secondary | ICD-10-CM | POA: Diagnosis not present

## 2016-02-19 DIAGNOSIS — R488 Other symbolic dysfunctions: Secondary | ICD-10-CM | POA: Diagnosis not present

## 2016-02-23 DIAGNOSIS — R278 Other lack of coordination: Secondary | ICD-10-CM | POA: Diagnosis not present

## 2016-02-23 DIAGNOSIS — M6281 Muscle weakness (generalized): Secondary | ICD-10-CM | POA: Diagnosis not present

## 2016-02-23 DIAGNOSIS — R488 Other symbolic dysfunctions: Secondary | ICD-10-CM | POA: Diagnosis not present

## 2016-02-23 DIAGNOSIS — R262 Difficulty in walking, not elsewhere classified: Secondary | ICD-10-CM | POA: Diagnosis not present

## 2016-02-24 DIAGNOSIS — R262 Difficulty in walking, not elsewhere classified: Secondary | ICD-10-CM | POA: Diagnosis not present

## 2016-02-24 DIAGNOSIS — R278 Other lack of coordination: Secondary | ICD-10-CM | POA: Diagnosis not present

## 2016-02-24 DIAGNOSIS — M6281 Muscle weakness (generalized): Secondary | ICD-10-CM | POA: Diagnosis not present

## 2016-02-24 DIAGNOSIS — R488 Other symbolic dysfunctions: Secondary | ICD-10-CM | POA: Diagnosis not present

## 2016-02-26 DIAGNOSIS — R278 Other lack of coordination: Secondary | ICD-10-CM | POA: Diagnosis not present

## 2016-02-26 DIAGNOSIS — M6281 Muscle weakness (generalized): Secondary | ICD-10-CM | POA: Diagnosis not present

## 2016-02-26 DIAGNOSIS — R488 Other symbolic dysfunctions: Secondary | ICD-10-CM | POA: Diagnosis not present

## 2016-02-26 DIAGNOSIS — R262 Difficulty in walking, not elsewhere classified: Secondary | ICD-10-CM | POA: Diagnosis not present

## 2016-03-05 DIAGNOSIS — R262 Difficulty in walking, not elsewhere classified: Secondary | ICD-10-CM | POA: Diagnosis not present

## 2016-03-05 DIAGNOSIS — R488 Other symbolic dysfunctions: Secondary | ICD-10-CM | POA: Diagnosis not present

## 2016-03-05 DIAGNOSIS — M6281 Muscle weakness (generalized): Secondary | ICD-10-CM | POA: Diagnosis not present

## 2016-03-05 DIAGNOSIS — R278 Other lack of coordination: Secondary | ICD-10-CM | POA: Diagnosis not present

## 2016-04-01 ENCOUNTER — Other Ambulatory Visit: Payer: Self-pay | Admitting: Family Medicine

## 2016-04-05 NOTE — Telephone Encounter (Signed)
30 day supply of donepezil and clopidogrel sent to pharmacy on 02/05/16. Pt last OV with PCP 05/2015 and has no future appts scheduled.  Please advise refills and follow up?

## 2016-05-01 ENCOUNTER — Other Ambulatory Visit: Payer: Self-pay | Admitting: Family Medicine

## 2016-05-03 NOTE — Telephone Encounter (Signed)
Last seen  06/05/15 and filled 12/11/15 #30 with 1 refill   Please advise    KP

## 2016-06-25 ENCOUNTER — Other Ambulatory Visit: Payer: Self-pay | Admitting: Family Medicine

## 2016-07-23 ENCOUNTER — Other Ambulatory Visit: Payer: Self-pay | Admitting: Family Medicine

## 2016-09-13 ENCOUNTER — Emergency Department (HOSPITAL_COMMUNITY): Payer: Medicare Other

## 2016-09-13 ENCOUNTER — Encounter (HOSPITAL_COMMUNITY): Payer: Self-pay | Admitting: Emergency Medicine

## 2016-09-13 ENCOUNTER — Observation Stay (HOSPITAL_COMMUNITY)
Admission: EM | Admit: 2016-09-13 | Discharge: 2016-09-14 | Disposition: A | Discharge status: Home / Self Care | Payer: Medicare Other | Attending: Internal Medicine | Admitting: Internal Medicine

## 2016-09-13 DIAGNOSIS — M1990 Unspecified osteoarthritis, unspecified site: Secondary | ICD-10-CM | POA: Diagnosis not present

## 2016-09-13 DIAGNOSIS — Z87440 Personal history of urinary (tract) infections: Secondary | ICD-10-CM | POA: Insufficient documentation

## 2016-09-13 DIAGNOSIS — F0280 Dementia in other diseases classified elsewhere without behavioral disturbance: Secondary | ICD-10-CM

## 2016-09-13 DIAGNOSIS — R55 Syncope and collapse: Secondary | ICD-10-CM | POA: Diagnosis not present

## 2016-09-13 DIAGNOSIS — B9620 Unspecified Escherichia coli [E. coli] as the cause of diseases classified elsewhere: Secondary | ICD-10-CM | POA: Diagnosis present

## 2016-09-13 DIAGNOSIS — I639 Cerebral infarction, unspecified: Secondary | ICD-10-CM | POA: Diagnosis present

## 2016-09-13 DIAGNOSIS — Z85828 Personal history of other malignant neoplasm of skin: Secondary | ICD-10-CM | POA: Diagnosis not present

## 2016-09-13 DIAGNOSIS — Z66 Do not resuscitate: Secondary | ICD-10-CM | POA: Insufficient documentation

## 2016-09-13 DIAGNOSIS — G309 Alzheimer's disease, unspecified: Secondary | ICD-10-CM | POA: Insufficient documentation

## 2016-09-13 DIAGNOSIS — E785 Hyperlipidemia, unspecified: Secondary | ICD-10-CM | POA: Diagnosis not present

## 2016-09-13 DIAGNOSIS — G319 Degenerative disease of nervous system, unspecified: Secondary | ICD-10-CM | POA: Diagnosis not present

## 2016-09-13 DIAGNOSIS — G308 Other Alzheimer's disease: Secondary | ICD-10-CM

## 2016-09-13 DIAGNOSIS — N390 Urinary tract infection, site not specified: Secondary | ICD-10-CM | POA: Diagnosis present

## 2016-09-13 DIAGNOSIS — R413 Other amnesia: Secondary | ICD-10-CM | POA: Diagnosis present

## 2016-09-13 DIAGNOSIS — Z8542 Personal history of malignant neoplasm of other parts of uterus: Secondary | ICD-10-CM | POA: Insufficient documentation

## 2016-09-13 DIAGNOSIS — R410 Disorientation, unspecified: Secondary | ICD-10-CM | POA: Diagnosis not present

## 2016-09-13 DIAGNOSIS — Z8673 Personal history of transient ischemic attack (TIA), and cerebral infarction without residual deficits: Secondary | ICD-10-CM | POA: Insufficient documentation

## 2016-09-13 DIAGNOSIS — I10 Essential (primary) hypertension: Secondary | ICD-10-CM | POA: Diagnosis present

## 2016-09-13 DIAGNOSIS — R402411 Glasgow coma scale score 13-15, in the field [EMT or ambulance]: Secondary | ICD-10-CM | POA: Diagnosis not present

## 2016-09-13 LAB — COMPREHENSIVE METABOLIC PANEL
ALBUMIN: 3.9 g/dL (ref 3.5–5.0)
ALT: 9 U/L — AB (ref 14–54)
AST: 17 U/L (ref 15–41)
Alkaline Phosphatase: 47 U/L (ref 38–126)
Anion gap: 8 (ref 5–15)
BUN: 15 mg/dL (ref 6–20)
CHLORIDE: 109 mmol/L (ref 101–111)
CO2: 26 mmol/L (ref 22–32)
CREATININE: 0.72 mg/dL (ref 0.44–1.00)
Calcium: 9.5 mg/dL (ref 8.9–10.3)
GFR calc Af Amer: >60 mL/min (ref >60)
GLUCOSE: 109 mg/dL — AB (ref 65–99)
POTASSIUM: 4.1 mmol/L (ref 3.5–5.1)
Sodium: 143 mmol/L (ref 135–145)
Total Bilirubin: 1.2 mg/dL (ref 0.3–1.2)
Total Protein: 6 g/dL — ABNORMAL LOW (ref 6.5–8.1)

## 2016-09-13 LAB — RAPID URINE DRUG SCREEN, HOSP PERFORMED
AMPHETAMINES: NOT DETECTED
BARBITURATES: NOT DETECTED
Benzodiazepines: NOT DETECTED
Cocaine: NOT DETECTED
OPIATES: NOT DETECTED
TETRAHYDROCANNABINOL: NOT DETECTED

## 2016-09-13 LAB — AMMONIA: AMMONIA: 11 umol/L (ref 9–35)

## 2016-09-13 LAB — CBC WITH DIFFERENTIAL/PLATELET
Basophils Absolute: 0 K/uL (ref 0.0–0.1)
Basophils Relative: 0 %
Eosinophils Absolute: 0.1 K/uL (ref 0.0–0.7)
Eosinophils Relative: 1 %
HCT: 42.7 % (ref 36.0–46.0)
Hemoglobin: 13.9 g/dL (ref 12.0–15.0)
Lymphocytes Relative: 31 %
Lymphs Abs: 1.7 K/uL (ref 0.7–4.0)
MCH: 31.9 pg (ref 26.0–34.0)
MCHC: 32.6 g/dL (ref 30.0–36.0)
MCV: 97.9 fL (ref 78.0–100.0)
Monocytes Absolute: 0.5 K/uL (ref 0.1–1.0)
Monocytes Relative: 10 %
Neutro Abs: 3.2 K/uL (ref 1.7–7.7)
Neutrophils Relative %: 58 %
Platelets: 203 K/uL (ref 150–400)
RBC: 4.36 MIL/uL (ref 3.87–5.11)
RDW: 13.5 % (ref 11.5–15.5)
WBC: 5.4 K/uL (ref 4.0–10.5)

## 2016-09-13 LAB — URINALYSIS, ROUTINE W REFLEX MICROSCOPIC
Bilirubin Urine: NEGATIVE
Glucose, UA: NEGATIVE mg/dL
Hgb urine dipstick: NEGATIVE
Ketones, ur: NEGATIVE mg/dL
Nitrite: NEGATIVE
Protein, ur: NEGATIVE mg/dL
Specific Gravity, Urine: 1.021 (ref 1.005–1.030)
pH: 6 (ref 5.0–8.0)

## 2016-09-13 LAB — I-STAT CG4 LACTIC ACID, ED: LACTIC ACID, VENOUS: 2.22 mmol/L — AB (ref 0.5–1.9)

## 2016-09-13 LAB — LACTIC ACID, PLASMA: LACTIC ACID, VENOUS: 0.9 mmol/L (ref 0.5–1.9)

## 2016-09-13 LAB — URINE MICROSCOPIC-ADD ON

## 2016-09-13 LAB — CBG MONITORING, ED: GLUCOSE-CAPILLARY: 96 mg/dL (ref 65–99)

## 2016-09-13 MED ORDER — ONDANSETRON HCL 4 MG/2ML IJ SOLN: 4.0000 mg | Freq: Four times a day (QID) | INTRAMUSCULAR | Status: DC | PRN

## 2016-09-13 MED ORDER — LISINOPRIL 10 MG PO TABS
10.0000 mg | ORAL_TABLET | Freq: Every day | ORAL | Status: DC
  Administered 2016-09-14: 10 mg via ORAL
  Filled 2016-09-13 (×2): qty 1

## 2016-09-13 MED ORDER — DONEPEZIL HCL 23 MG PO TABS
23.0000 mg | ORAL_TABLET | Freq: Every day | ORAL | Status: DC
  Administered 2016-09-13: 23 mg via ORAL
  Filled 2016-09-13: qty 1

## 2016-09-13 MED ORDER — CLOPIDOGREL BISULFATE 75 MG PO TABS: 75.0000 mg | ORAL_TABLET | Freq: Every day | ORAL | Status: DC

## 2016-09-13 MED ORDER — SODIUM CHLORIDE 0.9 % IV BOLUS (SEPSIS)
1000.0000 mL | Freq: Once | INTRAVENOUS | Status: AC
  Administered 2016-09-13: 1000 mL via INTRAVENOUS

## 2016-09-13 MED ORDER — CLOPIDOGREL BISULFATE 75 MG PO TABS
75.0000 mg | ORAL_TABLET | Freq: Every day | ORAL | Status: DC
  Administered 2016-09-14: 75 mg via ORAL
  Filled 2016-09-13 (×2): qty 1

## 2016-09-13 MED ORDER — ONDANSETRON HCL 4 MG PO TABS: 4.0000 mg | ORAL_TABLET | Freq: Four times a day (QID) | ORAL | Status: DC | PRN

## 2016-09-13 MED ORDER — ENOXAPARIN SODIUM 40 MG/0.4ML ~~LOC~~ SOLN: 40.0000 mg | SUBCUTANEOUS | Status: DC

## 2016-09-13 MED ORDER — LISINOPRIL 10 MG PO TABS: 10.0000 mg | ORAL_TABLET | Freq: Every day | ORAL | Status: DC

## 2016-09-13 MED ORDER — ENOXAPARIN SODIUM 40 MG/0.4ML ~~LOC~~ SOLN
40.0000 mg | SUBCUTANEOUS | Status: DC
  Filled 2016-09-13: qty 0.4

## 2016-09-13 MED ORDER — ATORVASTATIN CALCIUM 10 MG PO TABS
10.0000 mg | ORAL_TABLET | Freq: Every day | ORAL | Status: DC
  Filled 2016-09-13: qty 1

## 2016-09-13 MED ORDER — SODIUM CHLORIDE 0.9 % IV SOLN
INTRAVENOUS | Status: DC
  Administered 2016-09-13 – 2016-09-14 (×2): via INTRAVENOUS

## 2016-09-13 MED ORDER — SODIUM CHLORIDE 0.9% FLUSH
3.0000 mL | Freq: Two times a day (BID) | INTRAVENOUS | Status: DC
  Administered 2016-09-13 (×2): 3 mL via INTRAVENOUS

## 2016-09-13 NOTE — Progress Notes (Signed)
Pt admitted to the unit from ED; pt alert and verbally responsive; denies any pain; VSS; telemetry applied and verified with CCMD; NT called to second verify. Pt oriented to the unit and room; call light within reach. Skin dry and intact with no pressure ulcer or wounds noted. Bed alarm on and reported off to another RN. Delia Heady RN

## 2016-09-13 NOTE — Progress Notes (Addendum)
Pt refused stat lactic acid draw. Pt states " doctor didn't tell me" about it. NP notified.

## 2016-09-13 NOTE — ED Notes (Signed)
Patient comes from [facility] facility. Nursing staff called 911 after pt was "slumping to the L side while on commode." Hx dementia and confusion, but according to staff, she was alert&oriented x 1. Per previous RN, pt was A&O x 1 and confused upon arrival, but has perked up since. NIH was 2 upon arrival for lethargy and questions. Pt long-term memory intact, but poor short-term memory. She knows she has 2 children, but cannot recognize them. Her son has been at the bedside. Pt is very strong and is calm and cooperative, as long as you let her know what you are doing ahead of time. She has no complaints. Next neuro check due at 1830. Pt PASSED stroke swallow screen. VSS.

## 2016-09-13 NOTE — ED Triage Notes (Addendum)
Pt in from [facility] via [facility], after witnessed L sided "slumping" while sitting on the commode. Pt has hx of dementia, a&ox1 at baseline, arrives confused, slow to respond. Per EMS, staff was helping pt onto commode and pt leaned onto wall, with reported LOC x 18min. CBG 134, BP 136/76. MAE's equally, speech delayed but clear

## 2016-09-13 NOTE — ED Provider Notes (Signed)
[facility] DEPT Provider Note   CSN: [id number] Arrival date & time: 09/13/16  1039   LEVEL 5 CAVEAT - DEMENTIA  History   Chief Complaint Chief Complaint  Patient presents with  . Altered Mental Status  . Weakness    HPI Kimberly Craig is a 82 y.o. female.  HPI  82 year old female presents after a syncopal episode. The history is extremely limited as she has dementia and is not talking to me. EMS is no longer present so I get the history from the nurse. The nursing home does not answer when I called. The patient was being placed on the commode but was not yet using the bathroom and then slumped over to the left side. EMS was told that she was out for 5 minutes. They state that she has been slower today than normal and seems to be speaking slower they're concerned she is "altered". She is normally alert and oriented 1. No focal neurologic findings per EMS. Otherwise the history is quite limited.  4:50 PM I talked to staff at the facility that saw what happened today. She states she was getting the patient ready she was sitting on the commode and dressing her. She had her hands on her walker. All of a sudden she started drooling out of the right side of her mouth and had a facial droop. Her left arm appeared to be weak and dropped off the walker. She did not lose consciousness but was not answering questions and seemed to be staring straight ahead for about 5 minutes. Has never had this before.  Past Medical History:  Diagnosis Date  . Arthritis    "hands" (08/08/2015)  . Dementia   . Depression   . Hepatitis    not sure which  . Hyperlipidemia   . Hypertension   . Insomnia   . Skin cancer    "don't know what kind or where; it's in my skin" (08/08/2015)  . Stroke [facility]) 2015   denies residual on 08/08/2015  . Uterine cancer [facility])     Patient Active Problem List   Diagnosis Date Noted  . Syncope 09/13/2016  . Near syncope   . UTI (lower urinary tract infection)  08/08/2015  . Bradycardia 08/08/2015  . History of TIA (transient ischemic attack) 08/08/2015  . Protein-calorie malnutrition ([place]) 06/05/2015  . Essential hypertension 08/19/2014  . Cerebral infarction due to unspecified mechanism 08/19/2014  . R Bell's palsy 07/15/2014  . E-coli UTI 07/14/2014  . Cerebral infarction ([place]) 07/12/2014  . Hypokalemia 07/12/2014  . Dementia 07/12/2014  . Insomnia 03/01/2012  . B12 deficiency 02/22/2012  . Memory loss 02/09/2012  . Hyperlipidemia 02/08/2012  . HTN (hypertension) 02/08/2012    Past Surgical History:  Procedure Laterality Date  . APPENDECTOMY    . TONSILLECTOMY      OB History    No data available       Home Medications    Prior to Admission medications   Medication Sig Start Date End Date Taking? Authorizing Provider  atorvastatin (LIPITOR) 10 MG tablet TAKE 1 TABLET (10 MG TOTAL) BY MOUTH DAILY. 08/25/15  Yes Yvonne R Lowne Chase, DO  clopidogrel (PLAVIX) 75 MG tablet TAKE 1 TABLET BY MOUTH DAILY 07/23/16  Yes Yvonne R Lowne Chase, DO  donepezil (ARICEPT) 23 MG TABS tablet TAKE 1 TABLET BY MOUTH AT BEDTIME 07/23/16  Yes Yvonne R Lowne Chase, DO  lisinopril (PRINIVIL,ZESTRIL) 10 MG tablet TAKE 1 TABLET BY MOUTH EVERY DAY 06/25/16  Yes Kendrick Fries  R Lowne Chase, DO  traMADol-acetaminophen (ULTRACET) 37.5-325 MG tablet TAKE 1 TABLET BY MOUTH EVERY 6 HOURS AS NEEDED Patient not taking: Reported on 09/13/2016 05/03/16   Ann Held, DO    Family History Family History  Problem Relation Age of Onset  . Stroke Father     Social History Social History  Substance Use Topics  . Smoking status: Never Smoker  . Smokeless tobacco: Never Used  . Alcohol use No     Allergies   Review of patient's allergies indicates no known allergies.   Review of Systems Review of Systems  Unable to perform ROS: Dementia     Physical Exam Updated Vital Signs BP 143/74   Pulse (!) 59   Temp 98 F (36.7 C)   Resp 15   Ht 5\' 7"   (1.702 m)   Wt 157 lb (71.2 kg)   SpO2 99%   BMI 24.59 kg/m   Physical Exam  Constitutional: She appears well-developed and well-nourished.  HENT:  Head: Normocephalic and atraumatic.  Right Ear: External ear normal.  Left Ear: External ear normal.  Nose: Nose normal.  Eyes: Right eye exhibits no discharge. Left eye exhibits no discharge.  Cardiovascular: Normal rate, regular rhythm and normal heart sounds.   Pulmonary/Chest: Effort normal and breath sounds normal.  Abdominal: Soft. She exhibits no distension. There is no tenderness.  Neurological: She is alert. She is disoriented.  Alert. Stares at me but rarely speaks. When she does it does not sound slurred. No facial droop. Strong upper extremity testing. Lower extremity testing limited as she doesn't follow commands well. When I lift her leg up it slowly goes back down to the bed under her control bilaterally  Skin: Skin is warm and dry.  Nursing note and vitals reviewed.    ED Treatments / Results  Labs (all labs ordered are listed, but only abnormal results are displayed) Labs Reviewed  URINALYSIS, ROUTINE W REFLEX MICROSCOPIC (NOT AT [facility]) - Abnormal; Notable for the following:       Result Value   Leukocytes, UA TRACE (*)    All other components within normal limits  COMPREHENSIVE METABOLIC PANEL - Abnormal; Notable for the following:    Glucose, Bld 109 (*)    Total Protein 6.0 (*)    ALT 9 (*)    All other components within normal limits  URINE MICROSCOPIC-ADD ON - Abnormal; Notable for the following:    Squamous Epithelial / LPF 0-5 (*)    Bacteria, UA FEW (*)    All other components within normal limits  I-STAT CG4 LACTIC ACID, ED - Abnormal; Notable for the following:    Lactic Acid, Venous 2.22 (*)    All other components within normal limits  CULTURE, BLOOD (ROUTINE X 2)  CULTURE, BLOOD (ROUTINE X 2)  URINE CULTURE  CBC WITH DIFFERENTIAL/PLATELET  RAPID URINE DRUG SCREEN, HOSP PERFORMED  LACTIC ACID,  PLASMA  LACTIC ACID, PLASMA  AMMONIA  CBG MONITORING, ED  CBG MONITORING, ED  I-STAT CG4 LACTIC ACID, ED    EKG  EKG Interpretation  Date/Time:  Monday September 13 2016 10:48:00 EDT Ventricular Rate:  54 PR Interval:    QRS Duration: 87 QT Interval:  411 QTC Calculation: 390 R Axis:   88 Text Interpretation:  Sinus bradycardia Consider left atrial enlargement Borderline right axis deviation Probable anteroseptal infarct, old no significant change since Feb 2017 Confirmed by Regenia Skeeter MD, Auden Tatar [id number]) on 09/13/2016 10:51:28 AM  Radiology Ct Head Wo Contrast  Result Date: 09/13/2016 CLINICAL DATA:  Loss of consciousness this morning, confusion, slow to respond. EXAM: CT HEAD WITHOUT CONTRAST TECHNIQUE: Contiguous axial images were obtained from the base of the skull through the vertex without intravenous contrast. COMPARISON:  01/08/2016. FINDINGS: Brain: No evidence of an acute infarct, acute hemorrhage, mass lesion, mass effect or hydrocephalus. Atrophy. Confluent low-attenuation in the periventricular deep white matter with likely remote scattered infarcts in the deep gray nuclei. Vascular: No hyperdense vessel or unexpected calcification. Skull: Normal. Negative for fracture or focal lesion. Sinuses/Orbits: No acute finding. Other: None. IMPRESSION: 1. No acute intracranial abnormality. 2. Atrophy and extensive chronic microvascular white matter ischemic changes. Electronically Signed   By: Lorin Picket M.D.   On: 09/13/2016 12:04   Dg Chest Port 1 View  Result Date: 09/13/2016 CLINICAL DATA:  Left-sided slumped pink while sitting.  Confused EXAM: PORTABLE CHEST 1 VIEW COMPARISON:  01/08/2016 FINDINGS: The heart size and mediastinal contours are within normal limits. Both lungs are clear. The visualized skeletal structures are unremarkable. IMPRESSION: No active disease. Electronically Signed   By: Kathreen Devoid   On: 09/13/2016 11:32    Procedures Procedures (including  critical care time)  Medications Ordered in ED Medications  donepezil (ARICEPT) tablet 23 mg (not administered)  clopidogrel (PLAVIX) tablet 75 mg (not administered)  lisinopril (PRINIVIL,ZESTRIL) tablet 10 mg (not administered)  atorvastatin (LIPITOR) tablet 10 mg (not administered)  sodium chloride flush (NS) 0.9 % injection 3 mL (not administered)  enoxaparin (LOVENOX) injection 40 mg (not administered)  0.9 %  sodium chloride infusion (not administered)  ondansetron (ZOFRAN) tablet 4 mg (not administered)    Or  ondansetron (ZOFRAN) injection 4 mg (not administered)  sodium chloride 0.9 % bolus 1,000 mL (0 mLs Intravenous Stopped 09/13/16 1300)     Initial Impression / Assessment and Plan / ED Course  I have reviewed the triage vital signs and the nursing notes.  Pertinent labs & imaging results that were available during my care of the patient were reviewed by me and considered in my medical decision making (see chart for details).  Clinical Course  Comment By Time  Will work up for syncope, call NH to clarify if she's altered or if this is baseline. No focal findings at this time Sherwood Gambler, MD 10/30 1056  No one answered at her NH. Will do partial AMS workup given she's on plavix. Sherwood Gambler, MD 10/30 1059  Rectal temp 93, will put bear hugger, work up for sepsis. Sherwood Gambler, MD 10/30 1105  [facility] to admit Sherwood Gambler, MD 10/30 1421    No obvious source of infection. Appears at mental baseline per son at bedside. However, given that low of temp I think she needs to be obs'd. Sz vs syncope vs TIA.   Final Clinical Impressions(s) / ED Diagnoses   Final diagnoses:  Syncope, unspecified syncope type    New Prescriptions New Prescriptions   No medications on file     Sherwood Gambler, MD 09/13/16 1651

## 2016-09-13 NOTE — H&P (Signed)
History and Physical    Kimberly Craig [id number] DOB: 09-30-1930 DOA: 09/13/2016   PCP: Ann Held, DO   Patient coming from:  Nursing Home   Chief Complaint:  Presumed syncope and confusion    HPI: Kimberly Craig is a 82 y.o. female  Presenting to the ED after a presumed syncopal episode. History is provided by his son, as patient has dementia, and is unable to engage in conversation. Try to call the nursing home, which did not answer the phone call. Her son and EDP report, she was being placed on the commode, went she quote slumped" over the left side. EMS was told that these was about 5 minutes, accompanied by drooling from the right side of her mouth, and the possible facial droop. Also reported, left arm weakness. No apparent loss of consciousness. They also reported the patient to be more confused than usual, although her son reports that this is her baseline. He states that she had similar episodes in the recent year, about 3-4. She also had a history of CVA last year and "has never been the same since." No apparent  vision changes, headaches.No  Dysphagia.  No chest pain, palpitations, or shortness of breath. No  fever or chills, or night sweats. No abdominal pain, or diarrhea, sick contacts or new foods. SHe has frequent UTIs,  No lower extremity swelling.No  abnormal skin rashes, or neuropathy. Compliant with her medications.     ED Course:  BP 143/69   Pulse (!) 59   Temp 97.9 F (36.6 C) (Rectal)   Resp 16   Ht 5\' 7"  (1.702 m)   Wt 71.2 kg (157 lb)   SpO2 98%   BMI 24.59 kg/m    urinalysis negative for  nitrite, trace leukocytes. Urine culture pending.  CT of the head negative chest x-ray negative  lactic acid 2.22. White count 5.4 hemoglobin 13.9 platelets 203 troponin 0 EKG without evidence of ACS. Glucose 109. Review of Systems: As per HPI otherwise 10 point review of systems negative.   Past Medical History:  Diagnosis Date  . Arthritis    "hands"  (08/08/2015)  . Dementia   . Depression   . Hepatitis    not sure which  . Hyperlipidemia   . Hypertension   . Insomnia   . Skin cancer    "don't know what kind or where; it's in my skin" (08/08/2015)  . Stroke [facility]) 2015   denies residual on 08/08/2015  . Uterine cancer [facility])     Past Surgical History:  Procedure Laterality Date  . APPENDECTOMY    . TONSILLECTOMY      Social History Social History   Social History  . Marital status: Widowed    Spouse name: N/A  . Number of children: 3  . Years of education: college   Occupational History  . retired    Social History Main Topics  . Smoking status: Never Smoker  . Smokeless tobacco: Never Used  . Alcohol use No  . Drug use: No  . Sexual activity: Not Currently   Other Topics Concern  . Not on file   Social History Narrative   ** Merged History Encounter **         No Known Allergies  Family History  Problem Relation Age of Onset  . Stroke Father       Prior to Admission medications   Medication Sig Start Date End Date Taking? Authorizing Provider  atorvastatin (LIPITOR) 10  MG tablet TAKE 1 TABLET (10 MG TOTAL) BY MOUTH DAILY. 08/25/15  Yes Yvonne R Lowne Chase, DO  clopidogrel (PLAVIX) 75 MG tablet TAKE 1 TABLET BY MOUTH DAILY 07/23/16  Yes Yvonne R Lowne Chase, DO  donepezil (ARICEPT) 23 MG TABS tablet TAKE 1 TABLET BY MOUTH AT BEDTIME 07/23/16  Yes Yvonne R Lowne Chase, DO  lisinopril (PRINIVIL,ZESTRIL) 10 MG tablet TAKE 1 TABLET BY MOUTH EVERY DAY 06/25/16  Yes Yvonne R Lowne Chase, DO  traMADol-acetaminophen (ULTRACET) 37.5-325 MG tablet TAKE 1 TABLET BY MOUTH EVERY 6 HOURS AS NEEDED Patient not taking: Reported on 09/13/2016 05/03/16   Ann Held, DO    Physical Exam:    Vitals:   09/13/16 1430 09/13/16 1445 09/13/16 1500 09/13/16 1515  BP: 138/64 136/62 155/70 143/69  Pulse: 61 61 61 (!) 59  Resp: 15 21 16 16   Temp:      TempSrc:      SpO2: 96% 97% 98% 98%  Weight:      Height:            Constitutional: NAD, calm, comfortable, unable to engage in conversation due to dementia  Vitals:   09/13/16 1430 09/13/16 1445 09/13/16 1500 09/13/16 1515  BP: 138/64 136/62 155/70 143/69  Pulse: 61 61 61 (!) 59  Resp: 15 21 16 16   Temp:      TempSrc:      SpO2: 96% 97% 98% 98%  Weight:      Height:       Eyes: PERRL, lids and conjunctivae normal ENMT: Mucous membranes are dry. Posterior pharynx clear of any exudate or lesions.Normal dentition.  Neck: normal, supple, no masses, no thyromegaly Respiratory: clear to auscultation bilaterally, no wheezing, no crackles. Normal respiratory effort. No accessory muscle use.  Cardiovascular: Regular rate and rhythm, soft  murmurs / rubs / gallops. No extremity edema. 2+ pedal pulses. No carotid bruits.  Abdomen: no tenderness, no masses palpated. No hepatosplenomegaly. Bowel sounds positive. No suprapubic tenderness  Musculoskeletal: no clubbing / cyanosis. No joint deformity upper and lower extremities. Good ROM, no contractures. Normal muscle tone.  Skin: no rashes, lesions, ulcers.  Neurologic: CN 2-12 grossly intact. Sensation intact, DTR normal. Strength 5/5 in all 4.    Labs on Admission: I have personally reviewed following labs and imaging studies  CBC:  Recent Labs Lab 09/13/16 1058  WBC 5.4  NEUTROABS 3.2  HGB 13.9  HCT 42.7  MCV 97.9  PLT 123456    Basic Metabolic Panel:  Recent Labs Lab 09/13/16 1105  NA 143  K 4.1  CL 109  CO2 26  GLUCOSE 109*  BUN 15  CREATININE 0.72  CALCIUM 9.5    GFR: Estimated Creatinine Clearance: 50.9 mL/min (by C-G formula based on SCr of 0.72 mg/dL).  Liver Function Tests:  Recent Labs Lab 09/13/16 1105  AST 17  ALT 9*  ALKPHOS 47  BILITOT 1.2  PROT 6.0*  ALBUMIN 3.9   No results for input(s): LIPASE, AMYLASE in the last 168 hours. No results for input(s): AMMONIA in the last 168 hours.  Coagulation Profile: No results for input(s): INR, PROTIME in the  last 168 hours.  Cardiac Enzymes: No results for input(s): CKTOTAL, CKMB, CKMBINDEX, TROPONINI in the last 168 hours.  BNP (last 3 results) No results for input(s): PROBNP in the last 8760 hours.  HbA1C: No results for input(s): HGBA1C in the last 72 hours.  CBG:  Recent Labs Lab 09/13/16 1457  GLUCAP 96  Lipid Profile: No results for input(s): CHOL, HDL, LDLCALC, TRIG, CHOLHDL, LDLDIRECT in the last 72 hours.  Thyroid Function Tests: No results for input(s): TSH, T4TOTAL, FREET4, T3FREE, THYROIDAB in the last 72 hours.  Anemia Panel: No results for input(s): VITAMINB12, FOLATE, FERRITIN, TIBC, IRON, RETICCTPCT in the last 72 hours.  Urine analysis:    Component Value Date/Time   COLORURINE YELLOW 09/13/2016 [place] 09/13/2016 1247   LABSPEC 1.021 09/13/2016 1247   PHURINE 6.0 09/13/2016 1247   GLUCOSEU NEGATIVE 09/13/2016 1247   GLUCOSEU NEGATIVE 02/10/2012 0852   HGBUR NEGATIVE 09/13/2016 [place] 09/13/2016 [place] 09/13/2016 1247   PROTEINUR NEGATIVE 09/13/2016 1247   UROBILINOGEN 1.0 08/08/2015 1201   NITRITE NEGATIVE 09/13/2016 1247   LEUKOCYTESUR TRACE (A) 09/13/2016 1247    Sepsis Labs: @LABRCNTIP (procalcitonin:4,lacticidven:4) )No results found for this or any previous visit (from the past 240 hour(s)).   Radiological Exams on Admission: Ct Head Wo Contrast  Result Date: 09/13/2016 CLINICAL DATA:  Loss of consciousness this morning, confusion, slow to respond. EXAM: CT HEAD WITHOUT CONTRAST TECHNIQUE: Contiguous axial images were obtained from the base of the skull through the vertex without intravenous contrast. COMPARISON:  01/08/2016. FINDINGS: Brain: No evidence of an acute infarct, acute hemorrhage, mass lesion, mass effect or hydrocephalus. Atrophy. Confluent low-attenuation in the periventricular deep white matter with likely remote scattered infarcts in the deep gray nuclei. Vascular: No  hyperdense vessel or unexpected calcification. Skull: Normal. Negative for fracture or focal lesion. Sinuses/Orbits: No acute finding. Other: None. IMPRESSION: 1. No acute intracranial abnormality. 2. Atrophy and extensive chronic microvascular white matter ischemic changes. Electronically Signed   By: Lorin Picket M.D.   On: 09/13/2016 12:04   Dg Chest Port 1 View  Result Date: 09/13/2016 CLINICAL DATA:  Left-sided slumped pink while sitting.  Confused EXAM: PORTABLE CHEST 1 VIEW COMPARISON:  01/08/2016 FINDINGS: The heart size and mediastinal contours are within normal limits. Both lungs are clear. The visualized skeletal structures are unremarkable. IMPRESSION: No active disease. Electronically Signed   By: Kathreen Devoid   On: 09/13/2016 11:32    EKG: Independently reviewed.  Assessment/Plan Active Problems:   Near syncope   Hyperlipidemia   HTN (hypertension)   Memory loss   Cerebral infarction ([place])   E-coli UTI   Essential hypertension   Presumed Syncope versus Seizure vs. TIA (in a patient with a history of CVA ) . Patient with history of recurrent UTIS . Labs, EKG unrevealing.  Last 2 D echo Gr 1 DD LVH vigorous EF 65%. Son reports mental status is unchanged from baseline. . CT head and CXR unremarkable Tried to contact nursing home without answer. VSS, afebrile, lactic acid 2.2   Syncope order set  -admit for observation - Tele bed. -seizure precautions -2 D echo IV fluids EKG in am Blood and Urine cultures  UDS, ammonia level  Serial Lactic acid   Hyperlipidemia Continue home statins   Hypertension BP 143/69   Pulse (!) 59    Controlled Continue home anti-hypertensive medications    Dementia Continue Aricept    DVT prophylaxis: Lovenox  Code Status:   DNR  Family Communication:  Discussed with patient Disposition Plan: Expect patient to be discharged to home after     Bed: Tele Obs   Sung Parodi E, PA-C [facility]   09/13/2016, 3:30 PM

## 2016-09-14 DIAGNOSIS — N390 Urinary tract infection, site not specified: Secondary | ICD-10-CM | POA: Diagnosis not present

## 2016-09-14 DIAGNOSIS — R55 Syncope and collapse: Secondary | ICD-10-CM | POA: Diagnosis not present

## 2016-09-14 DIAGNOSIS — I10 Essential (primary) hypertension: Secondary | ICD-10-CM

## 2016-09-14 LAB — GLUCOSE, CAPILLARY: GLUCOSE-CAPILLARY: 86 mg/dL (ref 65–99)

## 2016-09-14 MED ORDER — CIPROFLOXACIN HCL 500 MG PO TABS: 500.0000 mg | ORAL_TABLET | Freq: Two times a day (BID) | ORAL | 0 refills | Status: DC

## 2016-09-14 NOTE — Care Management Obs Status (Signed)
[place] NOTIFICATION   Patient Details  Name: Kimberly Craig MRN: [id number] Date of Birth: 05/30/1930   Medicare Observation Status Notification Given:  Yes    Pollie Friar, RN 09/14/2016, 12:31 PM

## 2016-09-14 NOTE — Discharge Summary (Addendum)
Physician Discharge Summary  Kimberly Craig [id number] DOB: May 11, 1930 DOA: 09/13/2016  PCP: Ann Held, DO  Admit date: 09/13/2016 Discharge date: 09/14/2016  Recommendations for Outpatient Follow-up:  1. Continue ciprofloxacin for 3 days and discharged for UTI  Discharge Diagnoses:  Active Problems:   Hyperlipidemia   HTN (hypertension)   Memory loss   Cerebral infarction ([place])   E-coli UTI   Essential hypertension   Near syncope   Syncope   Alzheimer disease   Recurrent UTI    Discharge Condition: stable; spoke with patient's son over the phone. He explained to me that patient has baseline chronic dementia, her mental status has been stable and the only reason why he agreed to admission is because the blood work was pending. He prefers to take patient home today, he declined further diagnostic studies including echo and EEG.  Diet recommendation: as tolerated   History of present illness:   Per history of present illness 09/13/2016 "82 y.o. female  Presenting to the ED after a presumed syncopal episode. History is provided by his son, as patient has dementia, and is unable to engage in conversation. Try to call the nursing home, which did not answer the phone call. Her son and EDP report, she was being placed on the commode, went she quote slumped" over the left side. EMS was told that these was about 5 minutes, accompanied by drooling from the right side of her mouth, and the possible facial droop. Also reported, left arm weakness. No apparent loss of consciousness. They also reported the patient to be more confused than usual, although her son reports that this is her baseline. He states that she had similar episodes in the recent year, about 3-4. She also had a history of CVA last year and "has never been the same since." No apparent  vision changes, headaches.No  Dysphagia.  No chest pain, palpitations, or shortness of breath. No  fever or chills, or night sweats.  No abdominal pain, or diarrhea, sick contacts or new foods. SHe has frequent UTIs,  No lower extremity swelling.No  abnormal skin rashes, or neuropathy. Compliant with her medications."   Hospital Course:   Active Problems: Near syncope - Unclear etiology, possible UTI - CT head with no acute intracranial findings - Patient's son declined EEG and 2-D echo for further evaluation - Patient is medically stable for discharge, PT evaluation not done at this time since patient's son wants to take patient home - We will treat for urinary tract infection with ciprofloxacin for 3 days  Urinary tract infection - Urine cultures are pending but since patient's son was to take patient home without have final results available so we will prescribe Cipro for 3 days on discharge  Dyslipidemia - Continue Lipitor at bedtime  Dementia without behavioral disturbance - Continue Aricept  Hypertension essential - Continue lisinopril   Code status: DNR/DNI   SignedLeisa Lenz, MD  [facility] 09/14/2016, 11:08 AM  Pager #: [phone]  Time spent in minutes: less than 30 minutes  Procedures:  None  Consultations:  None   Discharge Exam: Vitals:   09/14/16 0600 09/14/16 0920  BP: (!) 174/64 (!) 137/59  Pulse: 60 63  Resp: 18 20  Temp: 97.5 F (36.4 C) 97.7 F (36.5 C)   Vitals:   09/14/16 0400 09/14/16 0500 09/14/16 0600 09/14/16 0920  BP: (!) 164/71  (!) 174/64 (!) 137/59  Pulse: 65  60 63  Resp: 18  18 20  Temp: 98 F (36.7 C)  97.5 F (36.4 C) 97.7 F (36.5 C)  TempSrc: Oral  Oral Oral  SpO2: 98%  100% 99%  Weight:  51.7 kg (114 lb)    Height:        General: Pt is alert, not in acute distress Cardiovascular: Regular rate and rhythm, S1/S2 + Respiratory: Clear to auscultation bilaterally, no wheezing, no crackles, no rhonchi Abdominal: Soft, non tender, non distended, bowel sounds +, no guarding Extremities: no edema, no cyanosis, pulses palpable  bilaterally DP and PT Neuro: Grossly nonfocal  Discharge Instructions  Discharge Instructions    Call MD for:  persistant nausea and vomiting    Complete by:  As directed    Call MD for:  redness, tenderness, or signs of infection (pain, swelling, redness, odor or green/yellow discharge around incision site)    Complete by:  As directed    Call MD for:  severe uncontrolled pain    Complete by:  As directed    Diet - low sodium heart healthy    Complete by:  As directed    Discharge instructions    Complete by:  As directed    Continue Cipro for 3 days on discharge for urinary tract infection   Increase activity slowly    Complete by:  As directed        Medication List    STOP taking these medications   traMADol-acetaminophen 37.5-325 MG tablet Commonly known as:  ULTRACET     TAKE these medications   atorvastatin 10 MG tablet Commonly known as:  LIPITOR TAKE 1 TABLET (10 MG TOTAL) BY MOUTH DAILY.   ciprofloxacin 500 MG tablet Commonly known as:  CIPRO Take 1 tablet (500 mg total) by mouth 2 (two) times daily.   clopidogrel 75 MG tablet Commonly known as:  PLAVIX TAKE 1 TABLET BY MOUTH DAILY   donepezil 23 MG Tabs tablet Commonly known as:  ARICEPT TAKE 1 TABLET BY MOUTH AT BEDTIME   lisinopril 10 MG tablet Commonly known as:  PRINIVIL,ZESTRIL TAKE 1 TABLET BY MOUTH EVERY DAY      Follow-up Information    Rosalita Chessman Chase, DO. Schedule an appointment as soon as possible for a visit in 1 week(s).   Specialty:  Family Medicine Contact information: [address] [place] [address] [phone]            The results of significant diagnostics from this hospitalization (including imaging, microbiology, ancillary and laboratory) are listed below for reference.    Significant Diagnostic Studies: Ct Head Wo Contrast  Result Date: 09/13/2016 CLINICAL DATA:  Loss of consciousness this morning, confusion, slow to respond. EXAM: CT  HEAD WITHOUT CONTRAST TECHNIQUE: Contiguous axial images were obtained from the base of the skull through the vertex without intravenous contrast. COMPARISON:  01/08/2016. FINDINGS: Brain: No evidence of an acute infarct, acute hemorrhage, mass lesion, mass effect or hydrocephalus. Atrophy. Confluent low-attenuation in the periventricular deep white matter with likely remote scattered infarcts in the deep gray nuclei. Vascular: No hyperdense vessel or unexpected calcification. Skull: Normal. Negative for fracture or focal lesion. Sinuses/Orbits: No acute finding. Other: None. IMPRESSION: 1. No acute intracranial abnormality. 2. Atrophy and extensive chronic microvascular white matter ischemic changes. Electronically Signed   By: Lorin Picket M.D.   On: 09/13/2016 12:04   Dg Chest Port 1 View  Result Date: 09/13/2016 CLINICAL DATA:  Left-sided slumped pink while sitting.  Confused EXAM: PORTABLE CHEST 1 VIEW  COMPARISON:  01/08/2016 FINDINGS: The heart size and mediastinal contours are within normal limits. Both lungs are clear. The visualized skeletal structures are unremarkable. IMPRESSION: No active disease. Electronically Signed   By: Kathreen Devoid   On: 09/13/2016 11:32    Microbiology: No results found for this or any previous visit (from the past 240 hour(s)).   Labs: Basic Metabolic Panel:  Recent Labs Lab 09/13/16 1105  NA 143  K 4.1  CL 109  CO2 26  GLUCOSE 109*  BUN 15  CREATININE 0.72  CALCIUM 9.5   Liver Function Tests:  Recent Labs Lab 09/13/16 1105  AST 17  ALT 9*  ALKPHOS 47  BILITOT 1.2  PROT 6.0*  ALBUMIN 3.9   No results for input(s): LIPASE, AMYLASE in the last 168 hours.  Recent Labs Lab 09/13/16 1615  AMMONIA 11   CBC:  Recent Labs Lab 09/13/16 1058  WBC 5.4  NEUTROABS 3.2  HGB 13.9  HCT 42.7  MCV 97.9  PLT 203   Cardiac Enzymes: No results for input(s): CKTOTAL, CKMB, CKMBINDEX, TROPONINI in the last 168 hours. BNP: BNP (last 3  results) No results for input(s): BNP in the last 8760 hours.  ProBNP (last 3 results) No results for input(s): PROBNP in the last 8760 hours.  CBG:  Recent Labs Lab 09/13/16 1457 09/14/16 0609  GLUCAP 96 86

## 2016-09-14 NOTE — Care Management Note (Addendum)
Case Management Note  Patient Details  Name: Kimberly Craig MRN: [id number] Date of Birth: [birth year]  Subjective/Objective:   Pt is from the independent part of [place]. Son states she has paid assistance at the independent living.                  Action/Plan: Pt returning to [facility] today. Son to provide transportation. No further needs per CM.   Expected Discharge Date:                  Expected Discharge Plan:    In-House Referral:  Clinical Social Work  Discharge planning Services     Post Acute Care Choice:    Choice offered to:     DME Arranged:    DME Agency:     HH Arranged:    [place] Agency:     Status of Service:  Completed, signed off  If discussed at [employer], dates discussed:    Additional Comments:  Pollie Friar, RN 09/14/2016, 12:34 PM

## 2016-09-14 NOTE — Discharge Instructions (Signed)

## 2016-09-14 NOTE — Progress Notes (Signed)
Pt refused lab work.

## 2016-09-15 ENCOUNTER — Telehealth: Payer: Self-pay

## 2016-09-15 LAB — URINE CULTURE: Culture: 40000 — AB

## 2016-09-15 NOTE — Telephone Encounter (Signed)
Transition Care Management Follow-up Telephone Call  ADMISSION DATE: 09/13/16 DISCHARGE DATE: 09/14/16    How have you been since you were released from the hospital? Spoke with patients son. States patient has been better     Do you understand why you were in the hospital? Son understands but patient does not understand why she was in hospital.   Do you understand the discharge instrcutions? Instructions given to Son. Patient would not understand due to dementia.  Items Reviewed:  Medications reviewed:    Allergies reviewed: NKDA  Dietary changes reviewed:As tolerated  Referrals reviewed: Follow up with pcp   Functional Questionnaire:   Activities of Daily Living (ADLs):  NEEDS ASSISTANCE   Any transportation issues/concerns?: PATIENT WILL HAVE TO BE TRANPORTED  Any patient concerns? NONE PER SON Confirmed importance and date/time of follow-up visits scheduled: Yes  Confirmed with patient if condition begins to worsen call PCP or go to the ER. Yes    Patient was given the Call-a-Nurse line [phone]: PATIENTS SON UNABLE TO TAKE INFORMATION BECAUSE HE IS AT WORK. ADVISED HIM TO CALL BACK SO THAT WE CAN SCHEDULE APPOINTMENT FOR PATIENT. SON AGREED.

## 2016-09-18 LAB — CULTURE, BLOOD (ROUTINE X 2)
CULTURE: NO GROWTH
Culture: NO GROWTH

## 2016-09-20 ENCOUNTER — Emergency Department (HOSPITAL_COMMUNITY)
Admission: EM | Admit: 2016-09-20 | Discharge: 2016-09-21 | Disposition: A | Discharge status: Home / Self Care | Payer: Medicare Other | Attending: Emergency Medicine | Admitting: Emergency Medicine

## 2016-09-20 ENCOUNTER — Emergency Department (HOSPITAL_COMMUNITY): Payer: Medicare Other

## 2016-09-20 DIAGNOSIS — Z79899 Other long term (current) drug therapy: Secondary | ICD-10-CM | POA: Diagnosis not present

## 2016-09-20 DIAGNOSIS — I10 Essential (primary) hypertension: Secondary | ICD-10-CM | POA: Diagnosis not present

## 2016-09-20 DIAGNOSIS — Z8541 Personal history of malignant neoplasm of cervix uteri: Secondary | ICD-10-CM | POA: Diagnosis not present

## 2016-09-20 DIAGNOSIS — R109 Unspecified abdominal pain: Secondary | ICD-10-CM | POA: Diagnosis not present

## 2016-09-20 DIAGNOSIS — Z8673 Personal history of transient ischemic attack (TIA), and cerebral infarction without residual deficits: Secondary | ICD-10-CM | POA: Diagnosis not present

## 2016-09-20 DIAGNOSIS — Z85828 Personal history of other malignant neoplasm of skin: Secondary | ICD-10-CM | POA: Insufficient documentation

## 2016-09-20 LAB — URINALYSIS, ROUTINE W REFLEX MICROSCOPIC
BILIRUBIN URINE: NEGATIVE
Glucose, UA: NEGATIVE mg/dL
HGB URINE DIPSTICK: NEGATIVE
Ketones, ur: NEGATIVE mg/dL
Nitrite: NEGATIVE
PH: 8 (ref 5.0–8.0)
Protein, ur: NEGATIVE mg/dL
SPECIFIC GRAVITY, URINE: 1.009 (ref 1.005–1.030)

## 2016-09-20 LAB — CBC WITH DIFFERENTIAL/PLATELET
Basophils Absolute: 0 10*3/uL (ref 0.0–0.1)
Basophils Relative: 0 %
EOS ABS: 0.1 10*3/uL (ref 0.0–0.7)
EOS PCT: 1 %
HCT: 38.1 % (ref 36.0–46.0)
HEMOGLOBIN: 13.1 g/dL (ref 12.0–15.0)
LYMPHS ABS: 2.3 10*3/uL (ref 0.7–4.0)
Lymphocytes Relative: 34 %
MCH: 31.9 pg (ref 26.0–34.0)
MCHC: 34.4 g/dL (ref 30.0–36.0)
MCV: 92.7 fL (ref 78.0–100.0)
MONO ABS: 0.9 10*3/uL (ref 0.1–1.0)
MONOS PCT: 13 %
NEUTROS PCT: 52 %
Neutro Abs: 3.5 10*3/uL (ref 1.7–7.7)
Platelets: 215 10*3/uL (ref 150–400)
RBC: 4.11 MIL/uL (ref 3.87–5.11)
RDW: 12.9 % (ref 11.5–15.5)
WBC: 6.8 10*3/uL (ref 4.0–10.5)

## 2016-09-20 LAB — COMPREHENSIVE METABOLIC PANEL
ALBUMIN: 4.4 g/dL (ref 3.5–5.0)
ALK PHOS: 64 U/L (ref 38–126)
ALT: 10 U/L — AB (ref 14–54)
AST: 18 U/L (ref 15–41)
Anion gap: 5 (ref 5–15)
BILIRUBIN TOTAL: 1.2 mg/dL (ref 0.3–1.2)
BUN: 19 mg/dL (ref 6–20)
CALCIUM: 9.6 mg/dL (ref 8.9–10.3)
CO2: 32 mmol/L (ref 22–32)
CREATININE: 0.79 mg/dL (ref 0.44–1.00)
Chloride: 109 mmol/L (ref 101–111)
GFR calc Af Amer: >60 mL/min (ref >60)
GFR calc non Af Amer: >60 mL/min (ref >60)
GLUCOSE: 108 mg/dL — AB (ref 65–99)
Potassium: 4.7 mmol/L (ref 3.5–5.1)
SODIUM: 146 mmol/L — AB (ref 135–145)
Total Protein: 6.7 g/dL (ref 6.5–8.1)

## 2016-09-20 LAB — URINE MICROSCOPIC-ADD ON: SQUAMOUS EPITHELIAL / LPF: NONE SEEN

## 2016-09-20 LAB — LIPASE, BLOOD: Lipase: 32 U/L (ref 11–51)

## 2016-09-20 MED ORDER — HYDROCODONE-ACETAMINOPHEN 5-325 MG PO TABS
1.0000 | ORAL_TABLET | Freq: Once | ORAL | Status: AC
  Administered 2016-09-21: 1 via ORAL
  Filled 2016-09-20: qty 1

## 2016-09-20 MED ORDER — CEPHALEXIN 500 MG PO CAPS: 500.0000 mg | ORAL_CAPSULE | Freq: Two times a day (BID) | ORAL | 0 refills | Status: DC

## 2016-09-20 MED ORDER — HYDROCODONE-ACETAMINOPHEN 5-325 MG PO TABS: 1.0000 | ORAL_TABLET | Freq: Four times a day (QID) | ORAL | 0 refills | Status: DC | PRN

## 2016-09-20 MED ORDER — FENTANYL CITRATE (PF) 100 MCG/2ML IJ SOLN
50.0000 ug | Freq: Once | INTRAMUSCULAR | Status: AC
  Administered 2016-09-20: 50 ug via INTRAVENOUS
  Filled 2016-09-20: qty 2

## 2016-09-20 MED ORDER — CEPHALEXIN 500 MG PO CAPS
500.0000 mg | ORAL_CAPSULE | Freq: Once | ORAL | Status: AC
  Administered 2016-09-21: 500 mg via ORAL
  Filled 2016-09-20: qty 1

## 2016-09-20 NOTE — ED Triage Notes (Signed)
Per EMS, pt is from [facility]. Pt has been complaining of right lower flank pain. Pt has hx of alzheimer's. EMS reports pt is hypertension.

## 2016-09-20 NOTE — ED Notes (Signed)
Pt son healthcare power of attorney. He would like to be contacted for any questions or concerns.   Nilda Riggs [phone]

## 2016-09-20 NOTE — ED Notes (Signed)
Bed: [id number] Expected date:  Expected time:  Means of arrival:  Comments: 82 yo F/ Flank pain

## 2016-09-20 NOTE — ED Provider Notes (Signed)
[facility] DEPT Provider Note   CSN: [id number] Arrival date & time: 09/20/16  2040     History   Chief Complaint Chief Complaint  Patient presents with  . Flank Pain    HPI Kimberly Craig is a 82 y.o. female.  HPI  82 year old female brought in for right flank pain. Started all of a sudden today. Patient and son gives the history. Patient was admitted for syncope last week and told she might have a slight UTI. She was then discharged with 2 days of antibiotics which she never seem to need it did not take. Patient was doing well and was back in the nursing home today when all of a sudden she started having pain. She is unable to lay still due to how bad the pain gets at times. No fevers or vomiting. Patient has a history of multiple prior UTIs. Has not taken anything for the pain. Denies abdominal pain.  Past Medical History:  Diagnosis Date  . Arthritis    "hands" (08/08/2015)  . Dementia   . Depression   . Hepatitis    not sure which  . Hyperlipidemia   . Hypertension   . Insomnia   . Skin cancer    "don't know what kind or where; it's in my skin" (08/08/2015)  . Stroke [facility]) 2015   denies residual on 08/08/2015  . Uterine cancer [facility])     Patient Active Problem List   Diagnosis Date Noted  . Syncope 09/13/2016  . Alzheimer disease   . Recurrent UTI   . Near syncope   . UTI (lower urinary tract infection) 08/08/2015  . Bradycardia 08/08/2015  . History of TIA (transient ischemic attack) 08/08/2015  . Protein-calorie malnutrition ([place]) 06/05/2015  . Essential hypertension 08/19/2014  . Cerebral infarction due to unspecified mechanism 08/19/2014  . R Bell's palsy 07/15/2014  . E-coli UTI 07/14/2014  . Cerebral infarction ([place]) 07/12/2014  . Hypokalemia 07/12/2014  . Dementia 07/12/2014  . Insomnia 03/01/2012  . B12 deficiency 02/22/2012  . Memory loss 02/09/2012  . Hyperlipidemia 02/08/2012  . HTN (hypertension) 02/08/2012    Past Surgical History:    Procedure Laterality Date  . APPENDECTOMY    . TONSILLECTOMY      OB History    No data available       Home Medications    Prior to Admission medications   Medication Sig Start Date End Date Taking? Authorizing Provider  atorvastatin (LIPITOR) 10 MG tablet TAKE 1 TABLET (10 MG TOTAL) BY MOUTH DAILY. 08/25/15  Yes Yvonne R Lowne Chase, DO  clopidogrel (PLAVIX) 75 MG tablet TAKE 1 TABLET BY MOUTH DAILY 07/23/16  Yes Yvonne R Lowne Chase, DO  donepezil (ARICEPT) 23 MG TABS tablet TAKE 1 TABLET BY MOUTH AT BEDTIME 07/23/16  Yes Yvonne R Lowne Chase, DO  lisinopril (PRINIVIL,ZESTRIL) 10 MG tablet TAKE 1 TABLET BY MOUTH EVERY DAY 06/25/16  Yes Yvonne R Lowne Chase, DO  Multiple Vitamins-Minerals (MULTIVITAMIN ADULT PO) Take 1 tablet by mouth daily.   Yes Historical Provider, MD  cephALEXin (KEFLEX) 500 MG capsule Take 1 capsule (500 mg total) by mouth 2 (two) times daily. 09/20/16   Sherwood Gambler, MD  HYDROcodone-acetaminophen (NORCO) 5-325 MG tablet Take 1 tablet by mouth every 6 (six) hours as needed for severe pain. 09/20/16   Sherwood Gambler, MD    Family History Family History  Problem Relation Age of Onset  . Stroke Father     Social History Social History  Substance  Use Topics  . Smoking status: Never Smoker  . Smokeless tobacco: Never Used  . Alcohol use No     Allergies   Patient has no known allergies.   Review of Systems Review of Systems  Constitutional: Negative for fever.  Gastrointestinal: Negative for abdominal pain and vomiting.  Genitourinary: Positive for flank pain.  Musculoskeletal: Positive for back pain.  All other systems reviewed and are negative.    Physical Exam Updated Vital Signs BP 173/69 (BP Location: Right Arm)   Pulse 70   Temp 97.8 F (36.6 C) (Oral)   Resp 18   SpO2 100%   Physical Exam  Constitutional: She is oriented to person, place, and time. She appears well-developed and well-nourished.  HENT:  Head: Normocephalic and  atraumatic.  Right Ear: External ear normal.  Left Ear: External ear normal.  Nose: Nose normal.  Eyes: Right eye exhibits no discharge. Left eye exhibits no discharge.  Cardiovascular: Normal rate, regular rhythm and normal heart sounds.   Pulmonary/Chest: Effort normal and breath sounds normal.  Abdominal: Soft. There is no tenderness. There is CVA tenderness (right).  Musculoskeletal:       Thoracic back: She exhibits no tenderness.       Lumbar back: She exhibits no tenderness.  Neurological: She is alert and oriented to person, place, and time.  Skin: Skin is warm and dry.  Nursing note and vitals reviewed.    ED Treatments / Results  Labs (all labs ordered are listed, but only abnormal results are displayed) Labs Reviewed  COMPREHENSIVE METABOLIC PANEL - Abnormal; Notable for the following:       Result Value   Sodium 146 (*)    Glucose, Bld 108 (*)    ALT 10 (*)    All other components within normal limits  URINALYSIS, ROUTINE W REFLEX MICROSCOPIC (NOT AT [facility]) - Abnormal; Notable for the following:    Leukocytes, UA TRACE (*)    All other components within normal limits  URINE MICROSCOPIC-ADD ON - Abnormal; Notable for the following:    Bacteria, UA RARE (*)    All other components within normal limits  URINE CULTURE  LIPASE, BLOOD  CBC WITH DIFFERENTIAL/PLATELET    EKG  EKG Interpretation None       Radiology Ct Renal Stone Study  Result Date: 09/20/2016 CLINICAL DATA:  Cramping right lower flank pain. EXAM: CT ABDOMEN AND PELVIS WITHOUT CONTRAST TECHNIQUE: Multidetector CT imaging of the abdomen and pelvis was performed following the standard protocol without IV contrast. COMPARISON:  07/12/2013 FINDINGS: Lower chest: Stable scarring in the right middle lobe. Probable scarring at the right lung base on sequence 6, image 17. No significant pleural effusions. Punctate nodular structures in the right lower lobe are nonspecific but probably postinflammatory.  Hepatobiliary: Stable sub cm hyperdensity at the hepatic dome probably represents a cyst. Normal appearance of the gallbladder. Pancreas: Normal appearance of the pancreas without inflammation or duct dilatation. Spleen: Normal appearance of spleen without enlargement. Adrenals/Urinary Tract: Normal adrenal glands. Negative for kidney stones. Extrarenal pelvis in both kidneys. No significant hydronephrosis. Urinary bladder is unremarkable. There appears to be a small exophytic cyst along the left kidney lower pole. Stomach/Bowel: No evidence for bowel obstruction. No gross abnormality to the stomach or duodenum. No focal bowel inflammation. Vascular/Lymphatic: Aorta is heavily calcified without aneurysm. No significant lymph node enlargement in the abdomen or pelvis. Reproductive: Uterus appears to be present but poorly characterized on this noncontrast examination. Other: Question a trace amount  of free fluid in the pelvis versus a small amount of fluid within a small bowel loop. Musculoskeletal: No acute abnormality. IMPRESSION: No acute abnormality in the abdomen or pelvis. Extrarenal pelvis in both kidneys without hydronephrosis and no stones. Punctate nodular densities at the right lung base could be postinflammatory but nonspecific. Presumed areas of scarring in the right middle lobe and right lower lobe as described. Electronically Signed   By: Markus Daft M.D.   On: 09/20/2016 22:40    Procedures Procedures (including critical care time)  Medications Ordered in ED Medications  fentaNYL (SUBLIMAZE) injection 50 mcg (50 mcg Intravenous Given 09/20/16 2157)  HYDROcodone-acetaminophen (NORCO/VICODIN) 5-325 MG per tablet 1 tablet (1 tablet Oral Given 09/21/16 0026)  cephALEXin (KEFLEX) capsule 500 mg (500 mg Oral Given 09/21/16 0026)     Initial Impression / Assessment and Plan / ED Course  I have reviewed the triage vital signs and the nursing notes.  Pertinent labs & imaging results that were  available during my care of the patient were reviewed by me and considered in my medical decision making (see chart for details).  Clinical Course as of Sep 21 148  [facility] Sep 20, 2016  2136 Will give fentanyl for pain. Will check urine and CT given flank pain. Gallbladder is less likely with no abd pain but will get LFTs/lipase  [SG]    Clinical Course User Index [SG] Sherwood Gambler, MD    Pain now gone. CT without acute findings. Urine similar to last time, only 0-5 WBC with trace leuks. Given last grew out Klebsiella that was sensitive to cephalosporins, will treat with keflex though this might just be MSK back pain. Norco for pain, return to NH and f/u closely with PCP. WBC normal, no vomiting, fevers. Son OK with plan, advised of return precautions.  Final Clinical Impressions(s) / ED Diagnoses   Final diagnoses:  Right flank pain    New Prescriptions Discharge Medication List as of 09/20/2016 11:55 PM    START taking these medications   Details  cephALEXin (KEFLEX) 500 MG capsule Take 1 capsule (500 mg total) by mouth 2 (two) times daily., Starting Mon 09/20/2016, Print    HYDROcodone-acetaminophen (NORCO) 5-325 MG tablet Take 1 tablet by mouth every 6 (six) hours as needed for severe pain., Starting Mon 09/20/2016, Print         Sherwood Gambler, MD 09/21/16 [phone]

## 2016-09-21 DIAGNOSIS — R109 Unspecified abdominal pain: Secondary | ICD-10-CM | POA: Diagnosis not present

## 2016-09-23 LAB — URINE CULTURE

## 2016-09-24 ENCOUNTER — Telehealth (HOSPITAL_BASED_OUTPATIENT_CLINIC_OR_DEPARTMENT_OTHER): Payer: Self-pay

## 2016-09-24 NOTE — Telephone Encounter (Signed)
Post ED Visit - Positive Culture Follow-up  Culture report reviewed by antimicrobial stewardship pharmacist:  []  Elenor Quinones, Pharm.D. []  Heide Guile, Pharm.D., BCPS []  Parks Neptune, Pharm.D. []  Alycia Rossetti, Pharm.D., BCPS []  [place], [place].D., BCPS, AAHIVP []  Legrand [place], Pharm.D., BCPS, AAHIVP []  Milus Glazier, Pharm.D. []  Rob Evette Doffing, Pharm.D. Apryl Lacretia Leigh D Positive urine culture Treated with Cephalexin, organism sensitive to the same and no further patient follow-up is required at this time.  Genia Del 09/24/2016, 9:55 AM

## 2016-10-17 ENCOUNTER — Other Ambulatory Visit: Payer: Self-pay | Admitting: Family Medicine

## 2016-10-18 ENCOUNTER — Other Ambulatory Visit: Payer: Self-pay | Admitting: Family Medicine

## 2016-10-18 NOTE — Telephone Encounter (Signed)
Last seen 06/05/15  Last filled: plavix #30-0 rf  07/23/16  Aricept #30-0 rf  07/23/16     Patient also requesting Tramadol never prescribed by you please advise  PC

## 2016-10-18 NOTE — Telephone Encounter (Signed)
Needs follow up office visit for more refills

## 2016-10-21 NOTE — Telephone Encounter (Signed)
Appointment scheduled with patient's son

## 2016-11-14 ENCOUNTER — Encounter (HOSPITAL_COMMUNITY): Payer: Self-pay | Admitting: *Deleted

## 2016-11-14 ENCOUNTER — Emergency Department (HOSPITAL_COMMUNITY): Payer: Medicare Other

## 2016-11-14 ENCOUNTER — Emergency Department (HOSPITAL_COMMUNITY)
Admission: EM | Admit: 2016-11-14 | Discharge: 2016-11-14 | Disposition: A | Discharge status: Home / Self Care | Payer: Medicare Other | Attending: Emergency Medicine | Admitting: Emergency Medicine

## 2016-11-14 DIAGNOSIS — I10 Essential (primary) hypertension: Secondary | ICD-10-CM | POA: Insufficient documentation

## 2016-11-14 DIAGNOSIS — G309 Alzheimer's disease, unspecified: Secondary | ICD-10-CM | POA: Diagnosis not present

## 2016-11-14 DIAGNOSIS — Z8673 Personal history of transient ischemic attack (TIA), and cerebral infarction without residual deficits: Secondary | ICD-10-CM | POA: Diagnosis not present

## 2016-11-14 DIAGNOSIS — Z85828 Personal history of other malignant neoplasm of skin: Secondary | ICD-10-CM | POA: Insufficient documentation

## 2016-11-14 DIAGNOSIS — R112 Nausea with vomiting, unspecified: Secondary | ICD-10-CM | POA: Insufficient documentation

## 2016-11-14 DIAGNOSIS — Z79899 Other long term (current) drug therapy: Secondary | ICD-10-CM | POA: Diagnosis not present

## 2016-11-14 DIAGNOSIS — R55 Syncope and collapse: Secondary | ICD-10-CM | POA: Diagnosis not present

## 2016-11-14 DIAGNOSIS — R531 Weakness: Secondary | ICD-10-CM | POA: Diagnosis not present

## 2016-11-14 DIAGNOSIS — R197 Diarrhea, unspecified: Secondary | ICD-10-CM | POA: Insufficient documentation

## 2016-11-14 DIAGNOSIS — R404 Transient alteration of awareness: Secondary | ICD-10-CM | POA: Diagnosis not present

## 2016-11-14 DIAGNOSIS — R1111 Vomiting without nausea: Secondary | ICD-10-CM | POA: Diagnosis not present

## 2016-11-14 LAB — URINALYSIS, ROUTINE W REFLEX MICROSCOPIC
BILIRUBIN URINE: NEGATIVE
GLUCOSE, UA: NEGATIVE mg/dL
KETONES UR: NEGATIVE mg/dL
NITRITE: POSITIVE — AB
PH: 5 (ref 5.0–8.0)
Protein, ur: NEGATIVE mg/dL
SPECIFIC GRAVITY, URINE: 1.018 (ref 1.005–1.030)
Squamous Epithelial / LPF: NONE SEEN

## 2016-11-14 LAB — COMPREHENSIVE METABOLIC PANEL
ALK PHOS: 50 U/L (ref 38–126)
ALT: 12 U/L — AB (ref 14–54)
ANION GAP: 8 (ref 5–15)
AST: 19 U/L (ref 15–41)
Albumin: 4.4 g/dL (ref 3.5–5.0)
BUN: 21 mg/dL — ABNORMAL HIGH (ref 6–20)
CALCIUM: 9.4 mg/dL (ref 8.9–10.3)
CO2: 28 mmol/L (ref 22–32)
CREATININE: 0.56 mg/dL (ref 0.44–1.00)
Chloride: 106 mmol/L (ref 101–111)
Glucose, Bld: 130 mg/dL — ABNORMAL HIGH (ref 65–99)
Potassium: 4.4 mmol/L (ref 3.5–5.1)
SODIUM: 142 mmol/L (ref 135–145)
Total Bilirubin: 1.4 mg/dL — ABNORMAL HIGH (ref 0.3–1.2)
Total Protein: 7.2 g/dL (ref 6.5–8.1)

## 2016-11-14 LAB — CBC
HCT: 46.1 % — ABNORMAL HIGH (ref 36.0–46.0)
HEMOGLOBIN: 15.1 g/dL — AB (ref 12.0–15.0)
MCH: 31 pg (ref 26.0–34.0)
MCHC: 32.8 g/dL (ref 30.0–36.0)
MCV: 94.7 fL (ref 78.0–100.0)
PLATELETS: 220 10*3/uL (ref 150–400)
RBC: 4.87 MIL/uL (ref 3.87–5.11)
RDW: 13.3 % (ref 11.5–15.5)
WBC: 11.5 10*3/uL — AB (ref 4.0–10.5)

## 2016-11-14 LAB — I-STAT CG4 LACTIC ACID, ED: Lactic Acid, Venous: 1.95 mmol/L (ref 0.5–1.9)

## 2016-11-14 LAB — I-STAT TROPONIN, ED: TROPONIN I, POC: 0 ng/mL (ref 0.00–0.08)

## 2016-11-14 LAB — LIPASE, BLOOD: LIPASE: 33 U/L (ref 11–51)

## 2016-11-14 MED ORDER — SODIUM CHLORIDE 0.9 % IV BOLUS (SEPSIS)
1000.0000 mL | Freq: Once | INTRAVENOUS | Status: AC
  Administered 2016-11-14: 1000 mL via INTRAVENOUS

## 2016-11-14 MED ORDER — ONDANSETRON HCL 4 MG/2ML IJ SOLN
4.0000 mg | Freq: Once | INTRAMUSCULAR | Status: AC
  Administered 2016-11-14: 4 mg via INTRAVENOUS
  Filled 2016-11-14: qty 2

## 2016-11-14 NOTE — ED Notes (Signed)
[facility] notified for transport back to [facility]. Pt. Remains in no distress. Her son is with  Her and Dr. Maryan Rued gives him a synopsis of our treatment/findings here today.

## 2016-11-14 NOTE — ED Provider Notes (Signed)
[facility] DEPT Provider Note   CSN: [id number] Arrival date & time: 11/14/16  0950     History   Chief Complaint Chief Complaint  Patient presents with  . Nausea  . Diarrhea    HPI Kimberly Craig is a 82 y.o. female.  Patient is an 82 year old female with a history of hypertension, hyperlipidemia, stroke and dementia who lives in a facility presenting today with nausea vomiting and diarrhea. Unsure when the symptoms started the patient also had an syncopal event while on the toilet this morning. Patient is nonverbal but will not yes and no to questions. She denies any chest pain or shortness of breath. She indicates she has abdominal pain it seems to be in the epigastric region. She will follow commands and is awake   The history is provided by the EMS personnel and the nursing home. The history is limited by the condition of the patient and the absence of a caregiver.  Diarrhea      Past Medical History:  Diagnosis Date  . Arthritis    "hands" (08/08/2015)  . Dementia   . Depression   . Hepatitis    not sure which  . Hyperlipidemia   . Hypertension   . Insomnia   . Skin cancer    "don't know what kind or where; it's in my skin" (08/08/2015)  . Stroke [facility]) 2015   denies residual on 08/08/2015  . Uterine cancer [facility])     Patient Active Problem List   Diagnosis Date Noted  . Syncope 09/13/2016  . Alzheimer disease   . Recurrent UTI   . Near syncope   . UTI (lower urinary tract infection) 08/08/2015  . Bradycardia 08/08/2015  . History of TIA (transient ischemic attack) 08/08/2015  . Protein-calorie malnutrition ([place]) 06/05/2015  . Essential hypertension 08/19/2014  . Cerebral infarction due to unspecified mechanism 08/19/2014  . R Bell's palsy 07/15/2014  . E-coli UTI 07/14/2014  . Cerebral infarction ([place]) 07/12/2014  . Hypokalemia 07/12/2014  . Dementia 07/12/2014  . Insomnia 03/01/2012  . B12 deficiency 02/22/2012  . Memory loss 02/09/2012  .  Hyperlipidemia 02/08/2012  . HTN (hypertension) 02/08/2012    Past Surgical History:  Procedure Laterality Date  . APPENDECTOMY    . TONSILLECTOMY      OB History    No data available       Home Medications    Prior to Admission medications   Medication Sig Start Date End Date Taking? Authorizing Provider  atorvastatin (LIPITOR) 10 MG tablet TAKE 1 TABLET (10 MG TOTAL) BY MOUTH DAILY. 08/25/15   Rosalita Chessman Chase, DO  cephALEXin (KEFLEX) 500 MG capsule Take 1 capsule (500 mg total) by mouth 2 (two) times daily. 09/20/16   Sherwood Gambler, MD  clopidogrel (PLAVIX) 75 MG tablet TAKE 1 TABLET BY MOUTH DAILY 10/18/16   Alferd Apa Lowne Chase, DO  donepezil (ARICEPT) 23 MG TABS tablet TAKE 1 TABLET BY MOUTH AT BEDTIME 10/18/16   Alferd Apa Lowne Chase, DO  HYDROcodone-acetaminophen (NORCO) 5-325 MG tablet Take 1 tablet by mouth every 6 (six) hours as needed for severe pain. 09/20/16   Sherwood Gambler, MD  lisinopril (PRINIVIL,ZESTRIL) 10 MG tablet TAKE 1 TABLET BY MOUTH EVERY DAY 10/18/16   Rosalita Chessman Chase, DO  Multiple Vitamins-Minerals (MULTIVITAMIN ADULT PO) Take 1 tablet by mouth daily.    Historical Provider, MD  traMADol-acetaminophen (ULTRACET) 37.5-325 MG tablet TAKE 1 TABLET BY MOUTH EVERY 6 HOURS AS NEEDED 10/18/16  Ann Held, DO    Family History Family History  Problem Relation Age of Onset  . Stroke Father     Social History Social History  Substance Use Topics  . Smoking status: Never Smoker  . Smokeless tobacco: Never Used  . Alcohol use No     Allergies   Patient has no known allergies.   Review of Systems Review of Systems  Unable to perform ROS: Dementia  Gastrointestinal: Positive for diarrhea.     Physical Exam Updated Vital Signs BP 161/78   Pulse 88   Temp 99.2 F (37.3 C) (Rectal)   Resp 22   SpO2 95%   Physical Exam  Constitutional: She appears well-developed and well-nourished. No distress.  HENT:  Head: Normocephalic and  atraumatic.  Mouth/Throat: Mucous membranes are dry.  Eyes: Conjunctivae and EOM are normal. Pupils are equal, round, and reactive to light.  Neck: Normal range of motion. Neck supple.  Cardiovascular: Normal rate, regular rhythm and intact distal pulses.   No murmur heard. Pulmonary/Chest: Effort normal and breath sounds normal. No respiratory distress. She has no wheezes. She has no rales.  Abdominal: Soft. She exhibits no distension. There is tenderness in the epigastric area. There is no rebound and no guarding.  Musculoskeletal: Normal range of motion. She exhibits no edema or tenderness.  Neurological: She is alert.  Alert but does not speak  Skin: Skin is warm and dry. No rash noted. No erythema.  Psychiatric: She has a normal mood and affect. Her behavior is normal.  Nursing note and vitals reviewed.    ED Treatments / Results  Labs (all labs ordered are listed, but only abnormal results are displayed) Labs Reviewed  COMPREHENSIVE METABOLIC PANEL - Abnormal; Notable for the following:       Result Value   Glucose, Bld 130 (*)    BUN 21 (*)    ALT 12 (*)    Total Bilirubin 1.4 (*)    All other components within normal limits  CBC - Abnormal; Notable for the following:    WBC 11.5 (*)    Hemoglobin 15.1 (*)    HCT 46.1 (*)    All other components within normal limits  URINALYSIS, ROUTINE W REFLEX MICROSCOPIC - Abnormal; Notable for the following:    APPearance HAZY (*)    Hgb urine dipstick SMALL (*)    Nitrite POSITIVE (*)    Leukocytes, UA SMALL (*)    Bacteria, UA MANY (*)    All other components within normal limits  I-STAT CG4 LACTIC ACID, ED - Abnormal; Notable for the following:    Lactic Acid, Venous 1.95 (*)    All other components within normal limits  LIPASE, BLOOD  I-STAT TROPOININ, ED  I-STAT CG4 LACTIC ACID, ED    EKG ED ECG REPORT   Date: 11/14/2016  Rate: 84  Rhythm: normal sinus rhythm  QRS Axis: right  Intervals: normal  ST/T Wave  abnormalities: nonspecific ST/T changes  Conduction Disutrbances:none  Narrative Interpretation:   Old EKG Reviewed: none available  I have personally reviewed the EKG tracing and agree with the computerized printout as noted.   Radiology Dg Chest Port 1 View  Result Date: 11/14/2016 CLINICAL DATA:  Pt c/o fainting today this morning on the toilet. Pt also c/o of weakness since this morning. PT HX: HTN EXAM: PORTABLE CHEST 1 VIEW COMPARISON:  09/13/2016 FINDINGS: Cardiac silhouette is normal in size. No mediastinal or hilar masses. No evidence of adenopathy. Mild  scarring noted at the lung apices.  Lungs are otherwise clear. No pleural effusion.  No pneumothorax. Skeletal structures are demineralized but grossly intact. IMPRESSION: No active cardiopulmonary disease. Stable appearance from the prior study. Electronically Signed   By: Lajean Manes M.D.   On: 11/14/2016 12:24    Procedures Procedures (including critical care time)  Medications Ordered in ED Medications  sodium chloride 0.9 % bolus 1,000 mL (1,000 mLs Intravenous New Bag/Given 11/14/16 1143)  ondansetron (ZOFRAN) injection 4 mg (4 mg Intravenous Given 11/14/16 1144)     Initial Impression / Assessment and Plan / ED Course  I have reviewed the triage vital signs and the nursing notes.  Pertinent labs & imaging results that were available during my care of the patient were reviewed by me and considered in my medical decision making (see chart for details).  Clinical Course    Patient is an 82 year old female presenting today with nausea vomiting and diarrhea from a nursing facility. Patient is demented and is nonverbal at this time however it was reported that she had a syncopal event while going to the bathroom today. Patient appears dehydrated on exam and has some mild epigastric tenderness. Vital signs are stable and labs are significant for hemoconcentration and mild leukocytosis. Troponin, EKG and chest x-ray pending.  Patient given Zofran and IV fluids.  Patient's labs without significant findings except for UA which is most likely contaminant culture will be done. Patient is tolerating by mouth's here without difficulty. Syncope most likely related to orthostasis because of recent vomiting and diarrhea. Patient discharged home.  Final Clinical Impressions(s) / ED Diagnoses   Final diagnoses:  Nausea vomiting and diarrhea    New Prescriptions New Prescriptions   No medications on file     Blanchie Dessert, MD 11/14/16 1409

## 2016-11-14 NOTE — ED Triage Notes (Addendum)
EMS states pt is from [facility] where multiple pts have same symptoms, N/V/D with syncopal episode on toilet this am. Pt has DNR with her. VSS, SR.

## 2016-11-14 NOTE — ED Notes (Signed)
She is drinking ice water without problem and remains in no distress.

## 2016-11-26 ENCOUNTER — Ambulatory Visit: Payer: Self-pay | Admitting: Family Medicine

## 2016-11-26 ENCOUNTER — Telehealth: Payer: Self-pay | Admitting: Family Medicine

## 2016-12-03 ENCOUNTER — Other Ambulatory Visit: Payer: Self-pay

## 2016-12-03 MED ORDER — LISINOPRIL 10 MG PO TABS: 10.0000 mg | ORAL_TABLET | Freq: Every day | ORAL | 0 refills | Status: DC

## 2016-12-03 MED ORDER — DONEPEZIL HCL 23 MG PO TABS: 23.0000 mg | ORAL_TABLET | Freq: Every day | ORAL | 0 refills | Status: DC

## 2016-12-03 MED ORDER — TRAMADOL-ACETAMINOPHEN 37.5-325 MG PO TABS
1.0000 | ORAL_TABLET | Freq: Four times a day (QID) | ORAL | 0 refills | Status: DC | PRN
Start: 2016-12-03 — End: 2017-01-13

## 2016-12-03 MED ORDER — CLOPIDOGREL BISULFATE 75 MG PO TABS: 75.0000 mg | ORAL_TABLET | Freq: Every day | ORAL | 0 refills | Status: DC

## 2016-12-03 NOTE — Telephone Encounter (Signed)
Spoke with patients son and let him know that his mom needed an appointment as soon as possible. Patients son agreed to 01/10/17 for an CPE with Dr. Etter Sjogren.   Rx sent over to pharmacy for 30 days only.  PC

## 2016-12-03 NOTE — Telephone Encounter (Signed)
Per Dr.Lowne medications can be refilled for 30 days patient has made appt for 01/10/17.   PC

## 2016-12-03 NOTE — Telephone Encounter (Signed)
Called and left message for patients son to call the office.  PC

## 2016-12-03 NOTE — Telephone Encounter (Signed)
Patient's son, Maebree Martina, called stating that he had an appt scheduled for his mother to come in for a medication follow up last week but it had to be canceled. He states his mother is too weak to go out and is not doing very well. He is requesting that Dr. Etter Sjogren- Chase just refill her medications without seeing her because it is too difficult with her condition to get her to the office to be seen. Please advise  Phone: [phone]

## 2016-12-06 ENCOUNTER — Other Ambulatory Visit: Payer: Self-pay

## 2016-12-06 MED ORDER — ATORVASTATIN CALCIUM 10 MG PO TABS: ORAL_TABLET | ORAL | 1 refills | Status: DC

## 2017-01-10 ENCOUNTER — Encounter: Payer: Self-pay | Admitting: Family Medicine

## 2017-01-12 ENCOUNTER — Telehealth: Payer: Self-pay | Admitting: Family Medicine

## 2017-01-12 NOTE — Telephone Encounter (Signed)
Patient scheduled AWV with PCP for 02/01/17 at 3:30

## 2017-01-12 NOTE — Telephone Encounter (Signed)
Relation to [id number] Call back [phone] Pharmacy: [facility], [place] - [address] AT [phone] (Phone) [phone] (Fax)     Reason for call:  Son cancelled patient physical for 01/10/2017 due to son having to go out of town for work, physical rescheduled for  02/01/17 with PCP. Son states patient is completey out of medication requesting a refill to hold patient over until appointment.   Requesting traMADol-acetaminophen (ULTRACET) 37.5-325 MG tablet , lisinopril (PRINIVIL,ZESTRIL) 10 MG tablet , donepezil (ARICEPT) 23 MG TABS tablet, clopidogrel (PLAVIX) 75 MG tablet and atorvastatin (LIPITOR) 10 MG tablet

## 2017-01-13 ENCOUNTER — Encounter: Payer: Self-pay | Admitting: Family Medicine

## 2017-01-13 MED ORDER — ATORVASTATIN CALCIUM 10 MG PO TABS: ORAL_TABLET | ORAL | 0 refills | Status: DC

## 2017-01-13 MED ORDER — DONEPEZIL HCL 23 MG PO TABS: 23.0000 mg | ORAL_TABLET | Freq: Every day | ORAL | 0 refills | Status: DC

## 2017-01-13 MED ORDER — CLOPIDOGREL BISULFATE 75 MG PO TABS: 75.0000 mg | ORAL_TABLET | Freq: Every day | ORAL | 0 refills | Status: DC

## 2017-01-13 MED ORDER — TRAMADOL-ACETAMINOPHEN 37.5-325 MG PO TABS
1.0000 | ORAL_TABLET | Freq: Four times a day (QID) | ORAL | 0 refills | Status: DC | PRN
Start: 2017-01-13 — End: 2017-02-01

## 2017-01-13 MED ORDER — LISINOPRIL 10 MG PO TABS
10.0000 mg | ORAL_TABLET | Freq: Every day | ORAL | 0 refills | Status: DC
Start: 2017-01-13 — End: 2017-02-01

## 2017-01-13 NOTE — Telephone Encounter (Signed)
Ballman,Steven (Son) returning call best # [phone]

## 2017-01-13 NOTE — Telephone Encounter (Signed)
Son informed of instructions. He stated they would pickup tramadol/contract/do UDS at her appt. On 02/01/17

## 2017-01-13 NOTE — Telephone Encounter (Signed)
Printed script/contract and called the patient to inform to come in to pickup hardcopy.

## 2017-01-13 NOTE — Telephone Encounter (Signed)
I have sent in all requested maintenance meds.  Just need approval for tramadol LAST APPT. On 06/05/2015 Next scheduled appt is 02/01/2017 Last refill for tramadol #30 no refills on 12/03/2016 No uds/No contract

## 2017-01-13 NOTE — Telephone Encounter (Signed)
Refill x1-- need contract and uds

## 2017-02-01 ENCOUNTER — Ambulatory Visit (INDEPENDENT_AMBULATORY_CARE_PROVIDER_SITE_OTHER): Payer: Medicare Other | Admitting: Family Medicine

## 2017-02-01 ENCOUNTER — Encounter: Payer: Self-pay | Admitting: Family Medicine

## 2017-02-01 VITALS — BP 138/70 | HR 64 | Temp 98.1°F | Resp 16 | Ht 67.0 in | Wt 150.0 lb

## 2017-02-01 DIAGNOSIS — G8929 Other chronic pain: Secondary | ICD-10-CM

## 2017-02-01 DIAGNOSIS — I10 Essential (primary) hypertension: Secondary | ICD-10-CM

## 2017-02-01 DIAGNOSIS — E782 Mixed hyperlipidemia: Secondary | ICD-10-CM

## 2017-02-01 DIAGNOSIS — I639 Cerebral infarction, unspecified: Secondary | ICD-10-CM

## 2017-02-01 DIAGNOSIS — Z23 Encounter for immunization: Secondary | ICD-10-CM | POA: Diagnosis not present

## 2017-02-01 DIAGNOSIS — F0390 Unspecified dementia without behavioral disturbance: Secondary | ICD-10-CM

## 2017-02-01 MED ORDER — ATORVASTATIN CALCIUM 10 MG PO TABS: ORAL_TABLET | ORAL | 3 refills | Status: DC

## 2017-02-01 MED ORDER — LISINOPRIL 10 MG PO TABS: 10.0000 mg | ORAL_TABLET | Freq: Every day | ORAL | 1 refills | Status: DC

## 2017-02-01 MED ORDER — TRAMADOL-ACETAMINOPHEN 37.5-325 MG PO TABS: 1.0000 | ORAL_TABLET | Freq: Four times a day (QID) | ORAL | 1 refills | Status: DC | PRN

## 2017-02-01 MED ORDER — DONEPEZIL HCL 23 MG PO TABS: 23.0000 mg | ORAL_TABLET | Freq: Every day | ORAL | 3 refills | Status: AC

## 2017-02-01 MED ORDER — CLOPIDOGREL BISULFATE 75 MG PO TABS: 75.0000 mg | ORAL_TABLET | Freq: Every day | ORAL | 3 refills | Status: DC

## 2017-02-01 NOTE — Progress Notes (Signed)
Subjective:  I acted as a Education administrator for Dr. Royden Purl, LPN    Patient ID: Kimberly Craig, female    DOB: [birth year], 83 y.o.   MRN: [id number]  No chief complaint on file.   Hyperlipidemia  This is a chronic problem. The current episode started more than 1 year ago. The problem is controlled. Pertinent negatives include no chest pain.  Hypertension  The current episode started more than 1 year ago. The problem is controlled. Pertinent negatives include no blurred vision, chest pain, headaches or palpitations.    Patient is in today for follow up hypertension, and hyperlipidemia. Patient have no concerns at this time.  Patient Care Team: Ann Held, DO as PCP - General (Family Medicine) Ann Held, DO (Family Medicine)   Past Medical History:  Diagnosis Date  . Arthritis    "hands" (08/08/2015)  . Dementia   . Depression   . Hepatitis    not sure which  . Hyperlipidemia   . Hypertension   . Insomnia   . Skin cancer    "don't know what kind or where; it's in my skin" (08/08/2015)  . Stroke [facility]) 2015   denies residual on 08/08/2015  . Uterine cancer [facility])     Past Surgical History:  Procedure Laterality Date  . APPENDECTOMY    . TONSILLECTOMY      Family History  Problem Relation Age of Onset  . Stroke Father     Social History   Social History  . Marital status: Widowed    Spouse name: N/A  . Number of children: 3  . Years of education: college   Occupational History  . retired    Social History Main Topics  . Smoking status: Never Smoker  . Smokeless tobacco: Never Used  . Alcohol use No  . Drug use: No  . Sexual activity: Not Currently   Other Topics Concern  . Not on file   Social History Narrative   ** Merged History Encounter **        Outpatient Medications Prior to Visit  Medication Sig Dispense Refill  . cephALEXin (KEFLEX) 500 MG capsule Take 1 capsule (500 mg total) by mouth 2 (two) times daily. 14 capsule 0    . HYDROcodone-acetaminophen (NORCO) 5-325 MG tablet Take 1 tablet by mouth every 6 (six) hours as needed for severe pain. 10 tablet 0  . atorvastatin (LIPITOR) 10 MG tablet TAKE 1 TABLET (10 MG TOTAL) BY MOUTH DAILY. 90 tablet 0  . clopidogrel (PLAVIX) 75 MG tablet Take 1 tablet (75 mg total) by mouth daily. 30 tablet 0  . donepezil (ARICEPT) 23 MG TABS tablet Take 1 tablet (23 mg total) by mouth at bedtime. 30 tablet 0  . lisinopril (PRINIVIL,ZESTRIL) 10 MG tablet Take 1 tablet (10 mg total) by mouth daily. 30 tablet 0  . traMADol-acetaminophen (ULTRACET) 37.5-325 MG tablet Take 1 tablet by mouth every 6 (six) hours as needed. 30 tablet 0   No facility-administered medications prior to visit.     No Known Allergies  Review of Systems  Constitutional: Negative for fever.  HENT: Negative for congestion.   Eyes: Negative for blurred vision.  Respiratory: Negative for cough.   Cardiovascular: Negative for chest pain and palpitations.  Gastrointestinal: Negative for vomiting.  Musculoskeletal: Negative for back pain.  Skin: Negative for rash.  Neurological: Negative for loss of consciousness and headaches.       Objective:    Physical Exam  Constitutional: She is oriented to person, place, and time. She appears well-developed and well-nourished. No distress.  HENT:  Head: Normocephalic and atraumatic.  Eyes: Conjunctivae are normal. Pupils are equal, round, and reactive to light.  Neck: Normal range of motion. No thyromegaly present.  Cardiovascular: Normal rate and regular rhythm.   Pulmonary/Chest: Effort normal and breath sounds normal. She has no wheezes.  Abdominal: Soft. Bowel sounds are normal. There is no tenderness.  Musculoskeletal: Normal range of motion. She exhibits no edema or deformity.  Neurological: She is alert and oriented to person, place, and time.  Skin: Skin is warm and dry. She is not diaphoretic.  Psychiatric: She has a normal mood and affect.    BP  138/70 (BP Location: Left Arm, Patient Position: Sitting, Cuff Size: Normal)   Pulse 64   Temp 98.1 F (36.7 C) (Oral)   Resp 16   Ht 5\' 7"  (1.702 m)   Wt 150 lb (68 kg)   SpO2 94%   BMI 23.49 kg/m  Wt Readings from Last 3 Encounters:  02/01/17 150 lb (68 kg)  09/14/16 114 lb (51.7 kg)  11/06/15 157 lb (71.2 kg)   BP Readings from Last 3 Encounters:  02/01/17 138/70  11/14/16 153/75  09/21/16 173/69     Immunization History  Administered Date(s) Administered  . Influenza Whole 10/01/2011  . PPD Test 02/08/2012  . Pneumococcal Conjugate-13 02/01/2017    Health Maintenance  Topic Date Due  . TETANUS/TDAP  02/10/1951  . DEXA SCAN  02/09/1997  . INFLUENZA VACCINE  06/15/2016  . PNA vac Low Risk Adult (2 of 2 - PPSV23) 02/01/2018    Lab Results  Component Value Date   WBC 6.7 02/01/2017   HGB 13.6 02/01/2017   HCT 41.6 02/01/2017   PLT 230.0 02/01/2017   GLUCOSE 107 (H) 02/01/2017   CHOL 156 02/01/2017   TRIG 108.0 02/01/2017   HDL 49.90 02/01/2017   LDLDIRECT 174.4 02/10/2012   LDLCALC 84 02/01/2017   ALT 7 02/01/2017   AST 13 02/01/2017   NA 141 02/01/2017   K 5.1 02/01/2017   CL 104 02/01/2017   CREATININE 0.69 02/01/2017   BUN 22 02/01/2017   CO2 30 02/01/2017   TSH 0.73 06/05/2015   INR 1.07 07/12/2014   HGBA1C 6.2 (H) 07/13/2014    Lab Results  Component Value Date   TSH 0.73 06/05/2015   Lab Results  Component Value Date   WBC 6.7 02/01/2017   HGB 13.6 02/01/2017   HCT 41.6 02/01/2017   MCV 96.3 02/01/2017   PLT 230.0 02/01/2017   Lab Results  Component Value Date   NA 141 02/01/2017   K 5.1 02/01/2017   CO2 30 02/01/2017   GLUCOSE 107 (H) 02/01/2017   BUN 22 02/01/2017   CREATININE 0.69 02/01/2017   BILITOT 0.8 02/01/2017   ALKPHOS 54 02/01/2017   AST 13 02/01/2017   ALT 7 02/01/2017   PROT 6.3 02/01/2017   ALBUMIN 4.0 02/01/2017   CALCIUM 9.5 02/01/2017   ANIONGAP 8 11/14/2016   GFR 85.95 02/01/2017   Lab Results    Component Value Date   CHOL 156 02/01/2017   Lab Results  Component Value Date   HDL 49.90 02/01/2017   Lab Results  Component Value Date   LDLCALC 84 02/01/2017   Lab Results  Component Value Date   TRIG 108.0 02/01/2017   Lab Results  Component Value Date   CHOLHDL 3 02/01/2017   Lab Results  Component Value Date   HGBA1C 6.2 (H) 07/13/2014         Assessment & Plan:   Problem List Items Addressed This Visit      Unprioritized   Dementia   Relevant Medications   donepezil (ARICEPT) 23 MG TABS tablet   HTN (hypertension) - Primary   Relevant Medications   atorvastatin (LIPITOR) 10 MG tablet   lisinopril (PRINIVIL,ZESTRIL) 10 MG tablet   Other Relevant Orders   CBC (Completed)   Comprehensive metabolic panel (Completed)   POCT Urinalysis Dipstick (Automated)   Hyperlipidemia   Relevant Medications   atorvastatin (LIPITOR) 10 MG tablet   lisinopril (PRINIVIL,ZESTRIL) 10 MG tablet   Other Relevant Orders   Lipid panel (Completed)   POCT Urinalysis Dipstick (Automated)    Other Visit Diagnoses    Other chronic pain       Relevant Medications   atorvastatin (LIPITOR) 10 MG tablet   traMADol-acetaminophen (ULTRACET) 37.5-325 MG tablet   Cerebrovascular accident (CVA), unspecified mechanism (HCC)       Relevant Medications   atorvastatin (LIPITOR) 10 MG tablet   lisinopril (PRINIVIL,ZESTRIL) 10 MG tablet   clopidogrel (PLAVIX) 75 MG tablet      I am having Ms. Goldinger maintain her HYDROcodone-acetaminophen, cephALEXin, atorvastatin, traMADol-acetaminophen, lisinopril, donepezil, and clopidogrel.  Meds ordered this encounter  Medications  . atorvastatin (LIPITOR) 10 MG tablet    Sig: TAKE 1 TABLET (10 MG TOTAL) BY MOUTH DAILY.    Dispense:  90 tablet    Refill:  3  . traMADol-acetaminophen (ULTRACET) 37.5-325 MG tablet    Sig: Take 1 tablet by mouth every 6 (six) hours as needed.    Dispense:  45 tablet    Refill:  1  . lisinopril  (PRINIVIL,ZESTRIL) 10 MG tablet    Sig: Take 1 tablet (10 mg total) by mouth daily.    Dispense:  90 tablet    Refill:  1    Needs follow up office visit  . donepezil (ARICEPT) 23 MG TABS tablet    Sig: Take 1 tablet (23 mg total) by mouth at bedtime.    Dispense:  90 tablet    Refill:  3  . clopidogrel (PLAVIX) 75 MG tablet    Sig: Take 1 tablet (75 mg total) by mouth daily.    Dispense:  90 tablet    Refill:  3    CMA served as scribe during this visit. History, Physical and Plan performed by medical provider. Documentation and orders reviewed and attested to.  Ann Held, DO   Patient ID: Kimberly Craig, female   DOB: [birth year], 83 y.o.   MRN: [id number]

## 2017-02-01 NOTE — Patient Instructions (Signed)

## 2017-02-01 NOTE — Progress Notes (Signed)
Pre visit review using our clinic review tool, if applicable. No additional management support is needed unless otherwise documented below in the visit note. 

## 2017-02-02 LAB — CBC
HEMATOCRIT: 41.6 % (ref 36.0–46.0)
Hemoglobin: 13.6 g/dL (ref 12.0–15.0)
MCHC: 32.7 g/dL (ref 30.0–36.0)
MCV: 96.3 fl (ref 78.0–100.0)
PLATELETS: 230 10*3/uL (ref 150.0–400.0)
RBC: 4.32 Mil/uL (ref 3.87–5.11)
RDW: 13.9 % (ref 11.5–15.5)
WBC: 6.7 10*3/uL (ref 4.0–10.5)

## 2017-02-02 LAB — COMPREHENSIVE METABOLIC PANEL
ALBUMIN: 4 g/dL (ref 3.5–5.2)
ALK PHOS: 54 U/L (ref 39–117)
ALT: 7 U/L (ref 0–35)
AST: 13 U/L (ref 0–37)
BUN: 22 mg/dL (ref 6–23)
CALCIUM: 9.5 mg/dL (ref 8.4–10.5)
CHLORIDE: 104 meq/L (ref 96–112)
CO2: 30 mEq/L (ref 19–32)
CREATININE: 0.69 mg/dL (ref 0.40–1.20)
GFR: 85.95 mL/min (ref >60.00)
Glucose, Bld: 107 mg/dL — ABNORMAL HIGH (ref 70–99)
POTASSIUM: 5.1 meq/L (ref 3.5–5.1)
Sodium: 141 mEq/L (ref 135–145)
TOTAL PROTEIN: 6.3 g/dL (ref 6.0–8.3)
Total Bilirubin: 0.8 mg/dL (ref 0.2–1.2)

## 2017-02-02 LAB — LIPID PANEL
CHOLESTEROL: 156 mg/dL (ref 0–200)
HDL: 49.9 mg/dL (ref >39.00)
LDL CALC: 84 mg/dL (ref 0–99)
NonHDL: 105.68
TRIGLYCERIDES: 108 mg/dL (ref 0.0–149.0)
Total CHOL/HDL Ratio: 3
VLDL: 21.6 mg/dL (ref 0.0–40.0)

## 2017-02-04 ENCOUNTER — Telehealth: Payer: Self-pay | Admitting: Family Medicine

## 2017-02-04 NOTE — Telephone Encounter (Signed)
-----   Message from Ann Held, DO sent at 02/03/2017 10:27 PM EDT ----- We need a UA and culture done---  Son states [facility] has contract with [facility]--  We need them to get [facility] to come out and do a urine

## 2017-02-04 NOTE — Telephone Encounter (Signed)
Called Gardiner Sleeper at [phone] with [facility] at [facility]. She then gave me the number/name for Sumner Boast at [place] at [facility] at [phone] to order them to go out to check this patients urine. Sent over an order for UA/culture -- diagnosis increased confusion R41.0 Attn [place] and faxed at [phone] Included order/snap shot/last office visit notes most recent visit and medication list. Family informed order done.

## 2017-02-14 ENCOUNTER — Telehealth: Payer: Self-pay

## 2017-02-14 NOTE — Telephone Encounter (Signed)
Duplicate note. LB   

## 2017-07-24 ENCOUNTER — Inpatient Hospital Stay (HOSPITAL_COMMUNITY)
Admission: EM | Admit: 2017-07-24 | Discharge: 2017-07-27 | DRG: 536 | Disposition: A | Discharge status: Hospice | Payer: Medicare Other | Attending: Internal Medicine | Admitting: Internal Medicine

## 2017-07-24 ENCOUNTER — Encounter (HOSPITAL_COMMUNITY): Payer: Self-pay | Admitting: *Deleted

## 2017-07-24 ENCOUNTER — Emergency Department (HOSPITAL_COMMUNITY): Payer: Medicare Other

## 2017-07-24 DIAGNOSIS — F0280 Dementia in other diseases classified elsewhere without behavioral disturbance: Secondary | ICD-10-CM | POA: Diagnosis not present

## 2017-07-24 DIAGNOSIS — F0390 Unspecified dementia without behavioral disturbance: Secondary | ICD-10-CM | POA: Diagnosis present

## 2017-07-24 DIAGNOSIS — R5381 Other malaise: Secondary | ICD-10-CM | POA: Diagnosis not present

## 2017-07-24 DIAGNOSIS — W19XXXA Unspecified fall, initial encounter: Secondary | ICD-10-CM | POA: Diagnosis present

## 2017-07-24 DIAGNOSIS — S72012A Unspecified intracapsular fracture of left femur, initial encounter for closed fracture: Secondary | ICD-10-CM | POA: Diagnosis not present

## 2017-07-24 DIAGNOSIS — Z7902 Long term (current) use of antithrombotics/antiplatelets: Secondary | ICD-10-CM | POA: Diagnosis not present

## 2017-07-24 DIAGNOSIS — N390 Urinary tract infection, site not specified: Secondary | ICD-10-CM | POA: Diagnosis present

## 2017-07-24 DIAGNOSIS — R634 Abnormal weight loss: Secondary | ICD-10-CM | POA: Diagnosis not present

## 2017-07-24 DIAGNOSIS — E860 Dehydration: Secondary | ICD-10-CM | POA: Diagnosis present

## 2017-07-24 DIAGNOSIS — Z515 Encounter for palliative care: Secondary | ICD-10-CM

## 2017-07-24 DIAGNOSIS — F0281 Dementia in other diseases classified elsewhere with behavioral disturbance: Secondary | ICD-10-CM | POA: Diagnosis present

## 2017-07-24 DIAGNOSIS — B961 Klebsiella pneumoniae [K. pneumoniae] as the cause of diseases classified elsewhere: Secondary | ICD-10-CM | POA: Diagnosis not present

## 2017-07-24 DIAGNOSIS — E876 Hypokalemia: Secondary | ICD-10-CM | POA: Diagnosis present

## 2017-07-24 DIAGNOSIS — Z993 Dependence on wheelchair: Secondary | ICD-10-CM

## 2017-07-24 DIAGNOSIS — G4700 Insomnia, unspecified: Secondary | ICD-10-CM | POA: Diagnosis present

## 2017-07-24 DIAGNOSIS — Z8542 Personal history of malignant neoplasm of other parts of uterus: Secondary | ICD-10-CM | POA: Diagnosis not present

## 2017-07-24 DIAGNOSIS — Z823 Family history of stroke: Secondary | ICD-10-CM | POA: Diagnosis not present

## 2017-07-24 DIAGNOSIS — S72002A Fracture of unspecified part of neck of left femur, initial encounter for closed fracture: Secondary | ICD-10-CM | POA: Diagnosis present

## 2017-07-24 DIAGNOSIS — R451 Restlessness and agitation: Secondary | ICD-10-CM | POA: Diagnosis present

## 2017-07-24 DIAGNOSIS — I10 Essential (primary) hypertension: Secondary | ICD-10-CM | POA: Diagnosis not present

## 2017-07-24 DIAGNOSIS — F419 Anxiety disorder, unspecified: Secondary | ICD-10-CM | POA: Diagnosis present

## 2017-07-24 DIAGNOSIS — Z8673 Personal history of transient ischemic attack (TIA), and cerebral infarction without residual deficits: Secondary | ICD-10-CM | POA: Diagnosis not present

## 2017-07-24 DIAGNOSIS — G301 Alzheimer's disease with late onset: Secondary | ICD-10-CM | POA: Diagnosis not present

## 2017-07-24 DIAGNOSIS — R4182 Altered mental status, unspecified: Secondary | ICD-10-CM | POA: Diagnosis not present

## 2017-07-24 DIAGNOSIS — Z66 Do not resuscitate: Secondary | ICD-10-CM | POA: Diagnosis present

## 2017-07-24 DIAGNOSIS — S79911A Unspecified injury of right hip, initial encounter: Secondary | ICD-10-CM | POA: Diagnosis not present

## 2017-07-24 DIAGNOSIS — Z436 Encounter for attention to other artificial openings of urinary tract: Secondary | ICD-10-CM | POA: Diagnosis not present

## 2017-07-24 DIAGNOSIS — S72092A Other fracture of head and neck of left femur, initial encounter for closed fracture: Secondary | ICD-10-CM | POA: Diagnosis not present

## 2017-07-24 DIAGNOSIS — E785 Hyperlipidemia, unspecified: Secondary | ICD-10-CM | POA: Diagnosis present

## 2017-07-24 DIAGNOSIS — Z7189 Other specified counseling: Secondary | ICD-10-CM | POA: Diagnosis not present

## 2017-07-24 DIAGNOSIS — Z9181 History of falling: Secondary | ICD-10-CM | POA: Diagnosis not present

## 2017-07-24 DIAGNOSIS — Z87440 Personal history of urinary (tract) infections: Secondary | ICD-10-CM | POA: Diagnosis not present

## 2017-07-24 DIAGNOSIS — G8911 Acute pain due to trauma: Secondary | ICD-10-CM | POA: Diagnosis not present

## 2017-07-24 DIAGNOSIS — M25552 Pain in left hip: Secondary | ICD-10-CM | POA: Diagnosis not present

## 2017-07-24 DIAGNOSIS — G309 Alzheimer's disease, unspecified: Secondary | ICD-10-CM | POA: Diagnosis not present

## 2017-07-24 DIAGNOSIS — S72009A Fracture of unspecified part of neck of unspecified femur, initial encounter for closed fracture: Secondary | ICD-10-CM | POA: Diagnosis present

## 2017-07-24 DIAGNOSIS — R9431 Abnormal electrocardiogram [ECG] [EKG]: Secondary | ICD-10-CM | POA: Diagnosis not present

## 2017-07-24 DIAGNOSIS — R112 Nausea with vomiting, unspecified: Secondary | ICD-10-CM | POA: Diagnosis not present

## 2017-07-24 DIAGNOSIS — S72002D Fracture of unspecified part of neck of left femur, subsequent encounter for closed fracture with routine healing: Secondary | ICD-10-CM | POA: Diagnosis not present

## 2017-07-24 DIAGNOSIS — S72012S Unspecified intracapsular fracture of left femur, sequela: Secondary | ICD-10-CM | POA: Diagnosis not present

## 2017-07-24 LAB — CBC WITH DIFFERENTIAL/PLATELET
BASOS ABS: 0 10*3/uL (ref 0.0–0.1)
Basophils Relative: 0 %
EOS ABS: 0 10*3/uL (ref 0.0–0.7)
Eosinophils Relative: 0 %
HCT: 44.9 % (ref 36.0–46.0)
HEMOGLOBIN: 15.1 g/dL — AB (ref 12.0–15.0)
LYMPHS ABS: 1.1 10*3/uL (ref 0.7–4.0)
LYMPHS PCT: 10 %
MCH: 32 pg (ref 26.0–34.0)
MCHC: 33.6 g/dL (ref 30.0–36.0)
MCV: 95.1 fL (ref 78.0–100.0)
Monocytes Absolute: 1.4 10*3/uL — ABNORMAL HIGH (ref 0.1–1.0)
Monocytes Relative: 13 %
NEUTROS PCT: 77 %
Neutro Abs: 7.8 10*3/uL — ABNORMAL HIGH (ref 1.7–7.7)
Platelets: 223 10*3/uL (ref 150–400)
RBC: 4.72 MIL/uL (ref 3.87–5.11)
RDW: 13.6 % (ref 11.5–15.5)
WBC: 10.2 10*3/uL (ref 4.0–10.5)

## 2017-07-24 LAB — URINALYSIS, ROUTINE W REFLEX MICROSCOPIC
BILIRUBIN URINE: NEGATIVE
Glucose, UA: NEGATIVE mg/dL
Ketones, ur: 5 mg/dL — AB
Nitrite: NEGATIVE
PH: 6 (ref 5.0–8.0)
Protein, ur: 100 mg/dL — AB
Specific Gravity, Urine: 1.016 (ref 1.005–1.030)

## 2017-07-24 LAB — BASIC METABOLIC PANEL
Anion gap: 11 (ref 5–15)
BUN: 17 mg/dL (ref 6–20)
CHLORIDE: 102 mmol/L (ref 101–111)
CO2: 32 mmol/L (ref 22–32)
Calcium: 9.5 mg/dL (ref 8.9–10.3)
Creatinine, Ser: 0.61 mg/dL (ref 0.44–1.00)
GFR calc non Af Amer: >60 mL/min (ref >60)
Glucose, Bld: 128 mg/dL — ABNORMAL HIGH (ref 65–99)
POTASSIUM: 3.4 mmol/L — AB (ref 3.5–5.1)
SODIUM: 145 mmol/L (ref 135–145)

## 2017-07-24 LAB — PROTIME-INR
INR: 1.1
PROTHROMBIN TIME: 14.1 s (ref 11.4–15.2)

## 2017-07-24 LAB — TYPE AND SCREEN
ABO/RH(D): O POS
ANTIBODY SCREEN: NEGATIVE

## 2017-07-24 MED ORDER — SODIUM CHLORIDE 0.45 % IV SOLN
INTRAVENOUS | Status: DC
  Administered 2017-07-24 – 2017-07-25 (×2): via INTRAVENOUS

## 2017-07-24 MED ORDER — HEPARIN SODIUM (PORCINE) 5000 UNIT/ML IJ SOLN
5000.0000 [IU] | Freq: Three times a day (TID) | INTRAMUSCULAR | Status: DC
  Administered 2017-07-25: 5000 [IU] via SUBCUTANEOUS
  Filled 2017-07-24 (×3): qty 1

## 2017-07-24 MED ORDER — CEFTRIAXONE SODIUM 1 G IJ SOLR: 1.0000 g | INTRAMUSCULAR | Status: DC

## 2017-07-24 MED ORDER — FENTANYL CITRATE (PF) 100 MCG/2ML IJ SOLN
50.0000 ug | INTRAMUSCULAR | Status: AC | PRN
  Administered 2017-07-24 (×2): 50 ug via INTRAVENOUS
  Filled 2017-07-24 (×2): qty 2

## 2017-07-24 MED ORDER — HYDRALAZINE HCL 20 MG/ML IJ SOLN
10.0000 mg | Freq: Once | INTRAMUSCULAR | Status: AC
  Administered 2017-07-24: 10 mg via INTRAVENOUS
  Filled 2017-07-24: qty 1

## 2017-07-24 MED ORDER — METOPROLOL TARTRATE 5 MG/5ML IV SOLN
5.0000 mg | Freq: Four times a day (QID) | INTRAVENOUS | Status: DC
  Administered 2017-07-24 – 2017-07-26 (×8): 5 mg via INTRAVENOUS
  Filled 2017-07-24 (×8): qty 5

## 2017-07-24 MED ORDER — HYDROMORPHONE HCL 1 MG/ML IJ SOLN
0.5000 mg | INTRAMUSCULAR | Status: DC | PRN
  Administered 2017-07-24: 0.5 mg via INTRAVENOUS
  Filled 2017-07-24: qty 1

## 2017-07-24 MED ORDER — DEXTROSE 5 % IV SOLN
1.0000 g | Freq: Once | INTRAVENOUS | Status: AC
  Administered 2017-07-24: 1 g via INTRAVENOUS
  Filled 2017-07-24: qty 10

## 2017-07-24 NOTE — ED Notes (Signed)
Bed: [facility] Expected date:  Expected time:  Means of arrival:  Comments: Fall- hip pain

## 2017-07-24 NOTE — H&P (Addendum)
History and Physical    Kimberly Craig PIR:[id number] DOB: May 29, 1930 DOA: 07/24/2017  PCP: Ann Held, DO Patient coming from: Independent living at [facility] have personally briefly reviewed patient's old medical records in [place]  Chief Complaint: Fall  HPI: Kimberly Craig is a 83 y.o. female with medical history significant of dementia admitted with right hip fracture. Patient has end-stage dementia. History obtained from records available to me. She fell 4 days ago continued to complain of pain in the left(I was going to hip so the family decided to bring her to ER. Dr. Regenia Skeeter from the ER spoke to Dr. Percell Miller who wants to take her to the OR tomorrow afternoon. He wants patient to be admitted to [facility]. Dr. Percell Miller from [facility] will see her tomorrow morning. When I saw her this morning she appeared to be very dry her blood pressure was elevated systolic blood pressure was above 216. She  didn't appear to be in any distress. She was unable to answer my questions. There was no family available.patients son requesting to speak to dr Percell Miller before any decision can be made regarding surgery.  ED Course:  she had a hip x-ray which showed impacted left subcapital femoral neck fracture. She was given fentanyl for pain hydralazine for blood pressure.  Review of Systems:  As per HPI otherwise 10 point review of systems negative.   Past Medical History:  Diagnosis Date  . Arthritis    "hands" (08/08/2015)  . Dementia   . Depression   . Hepatitis    not sure which  . Hyperlipidemia   . Hypertension   . Insomnia   . Skin cancer    "don't know what kind or where; it's in my skin" (08/08/2015)  . Stroke [facility]) 2015   denies residual on 08/08/2015  . Uterine cancer [facility])     Past Surgical History:  Procedure Laterality Date  . APPENDECTOMY    . TONSILLECTOMY       reports that she has never smoked. She has never used smokeless tobacco. She reports that she does not  drink alcohol or use drugs.  No Known Allergies  Family History  Problem Relation Age of Onset  . Stroke Father    Unacceptable: Noncontributory, unremarkable, or negative. Acceptable: Family history reviewed and not pertinent (If you reviewed it)  Prior to Admission medications   Medication Sig Start Date End Date Taking? Authorizing Provider  atorvastatin (LIPITOR) 10 MG tablet TAKE 1 TABLET (10 MG TOTAL) BY MOUTH DAILY. 02/01/17  Yes Roma Schanz R, DO  clopidogrel (PLAVIX) 75 MG tablet Take 1 tablet (75 mg total) by mouth daily. 02/01/17  Yes Roma Schanz R, DO  donepezil (ARICEPT) 23 MG TABS tablet Take 1 tablet (23 mg total) by mouth at bedtime. 02/01/17  Yes Roma Schanz R, DO  lisinopril (PRINIVIL,ZESTRIL) 10 MG tablet Take 1 tablet (10 mg total) by mouth daily. 02/01/17  Yes Ann Held, DO    Physical Exam: Vitals:   07/24/17 1253 07/24/17 1500 07/24/17 1529  BP: (!) 216/104 (!) 224/103 (!) 219/98  Pulse: 85 84 86  Resp: 17 (!) 24 (!) 24  Temp: 98.9 F (37.2 C)    TempSrc: Oral    SpO2: 98% 99% 96%    Constitutional: NAD, calm, comfortable Vitals:   07/24/17 1253 07/24/17 1500 07/24/17 1529  BP: (!) 216/104 (!) 224/103 (!) 219/98  Pulse: 85 84 86  Resp: 17 (!)  24 (!) 24  Temp: 98.9 F (37.2 C)    TempSrc: Oral    SpO2: 98% 99% 96%   Eyes: PERRL, lids and conjunctivae normal ENMT: Mucous membranes are DRY. Posterior pharynx clear of any exudate or lesions.Normal dentition.  Neck: normal, supple, no masses, no thyromegaly Respiratory: clear to auscultation bilaterally, no wheezing, no crackles. Normal respiratory effort. No accessory muscle use.  Cardiovascular: Regular rate and rhythm, no murmurs / rubs / gallops. No extremity edema. 2+ pedal pulses. No carotid bruits.  Abdomen: no tenderness, no masses palpated. No hepatosplenomegaly. Bowel sounds positive.  Musculoskeletal: no clubbing / cyanosis. No joint deformity upper and lower  extremities. Good ROM, no contractures. Normal muscle tone.  Skin: no rashes, lesions, ulcers. No induration Neurologic: CN 2-12 grossly intact, DTR normal.  Psychiatric: Normal judgment and insight. Alert and oriented x 3. Normal mood.   Labs on Admission: I have personally reviewed following labs and imaging studies  CBC:  Recent Labs Lab 07/24/17 1431  WBC 10.2  NEUTROABS 7.8*  HGB 15.1*  HCT 44.9  MCV 95.1  PLT 884   Basic Metabolic Panel:  Recent Labs Lab 07/24/17 1431  NA 145  K 3.4*  CL 102  CO2 32  GLUCOSE 128*  BUN 17  CREATININE 0.61  CALCIUM 9.5   GFR: CrCl cannot be calculated (Unknown ideal weight.). Liver Function Tests: No results for input(s): AST, ALT, ALKPHOS, BILITOT, PROT, ALBUMIN in the last 168 hours. No results for input(s): LIPASE, AMYLASE in the last 168 hours. No results for input(s): AMMONIA in the last 168 hours. Coagulation Profile:  Recent Labs Lab 07/24/17 1431  INR 1.10   Cardiac Enzymes: No results for input(s): CKTOTAL, CKMB, CKMBINDEX, TROPONINI in the last 168 hours. BNP (last 3 results) No results for input(s): PROBNP in the last 8760 hours. HbA1C: No results for input(s): HGBA1C in the last 72 hours. CBG: No results for input(s): GLUCAP in the last 168 hours. Lipid Profile: No results for input(s): CHOL, HDL, LDLCALC, TRIG, CHOLHDL, LDLDIRECT in the last 72 hours. Thyroid Function Tests: No results for input(s): TSH, T4TOTAL, FREET4, T3FREE, THYROIDAB in the last 72 hours. Anemia Panel: No results for input(s): VITAMINB12, FOLATE, FERRITIN, TIBC, IRON, RETICCTPCT in the last 72 hours. Urine analysis:    Component Value Date/Time   COLORURINE YELLOW 11/14/2016 1159   APPEARANCEUR HAZY (A) 11/14/2016 1159   LABSPEC 1.018 11/14/2016 1159   PHURINE 5.0 11/14/2016 1159   GLUCOSEU NEGATIVE 11/14/2016 1159   GLUCOSEU NEGATIVE 02/10/2012 0852   HGBUR SMALL (A) 11/14/2016 1159   BILIRUBINUR NEGATIVE 11/14/2016 1159    KETONESUR NEGATIVE 11/14/2016 1159   PROTEINUR NEGATIVE 11/14/2016 1159   UROBILINOGEN 1.0 08/08/2015 1201   NITRITE POSITIVE (A) 11/14/2016 1159   LEUKOCYTESUR SMALL (A) 11/14/2016 1159    Radiological Exams on Admission: Dg Hip Unilat With Pelvis 2-3 Views Left  Result Date: 07/24/2017 CLINICAL DATA:  83 year old female with persistent left hip pain after falling EXAM: DG HIP (WITH OR WITHOUT PELVIS) 2-3V LEFT COMPARISON:  None. FINDINGS: Impacted left subcapital femoral neck fracture. The femoral head remains located. The right hip is intact. The visualized bony pelvis is intact. Of note, the bones are extremely osteopenic in appearance. IMPRESSION: Impacted left subcapital femoral neck fracture. Electronically Signed   By: Jacqulynn Cadet M.D.   On: 07/24/2017 13:48   EKG: Independently reviewed.   Assessment/Plan Active Problems:   Hip fracture (HCC)  Impacted left subcapital hip fracture patient [facility]. Percell Miller from [place] to see her at [facility] and take her for surgery tomorrow afternoon.PATIENTS SON REQUESTING ORTHO TO SPEAK TO HIM TO HEAR THE OPTIONS.He is not sure if he really wants his mom to go through suregry or not??Patient is bed bound/wheel chair bound.she has not walked in months.  End-stage dementia patient on Aricept which I will hold at this time because she is nothing by mouth.  Uncontrolled hypertension I will start her on IV Lopressor. Patient takes an ACE inhibitor prior to admission.  Mild hypokalemia replete and follow-up to levels.  UTI with positive nitrates and small amount of leukocyte Estrace follow-up urine culture. I will give her a dose of Rocephin prior to surgery.  DVT prophylaxis: Heparin  Code Status: DO NOT RESUSCITATE Family Communication: None Disposition Plan: Possible placement after hip surgery Consults called ortho Admission status med surg   Georgette Shell MD [facility]   If 7PM-7AM, please contact  night-coverage [email or web address] Password [email or web address]  07/24/2017, 4:00 PM

## 2017-07-24 NOTE — ED Triage Notes (Signed)
Pt presents from [facility] after a fall 4 days ago. Continues to c/o L hip pain after the fall. Alert.

## 2017-07-24 NOTE — ED Provider Notes (Signed)
[facility] DEPT Provider Note   CSN: [id number] Arrival date & time: 07/24/17  1241   LEVEL 5 CAVEAT - DEMENTIA  History   Chief Complaint Chief Complaint  Patient presents with  . Hip Pain    HPI Kimberly Craig is a 83 y.o. female.  HPI  83 year old female with significant history of dementia and prior stroke presents with left hip pain. History is taken from the son over the phone. Patient is at her mental baseline which includes severe dementia. She fell about 4 days ago. On and off she will complain of different types of pain and this is chronic for her with her dementia. However she has persistently complained of left hip pain so she was brought in for evaluation today. She mostly is in a wheelchair but can stand and pivot and walk briefly.  Past Medical History:  Diagnosis Date  . Arthritis    "hands" (08/08/2015)  . Dementia   . Depression   . Hepatitis    not sure which  . Hyperlipidemia   . Hypertension   . Insomnia   . Skin cancer    "don't know what kind or where; it's in my skin" (08/08/2015)  . Stroke [facility]) 2015   denies residual on 08/08/2015  . Uterine cancer [facility])     Patient Active Problem List   Diagnosis Date Noted  . Hip fracture ([place]) 07/24/2017  . Syncope 09/13/2016  . Alzheimer disease   . Recurrent UTI   . Near syncope   . UTI (lower urinary tract infection) 08/08/2015  . Bradycardia 08/08/2015  . History of TIA (transient ischemic attack) 08/08/2015  . Protein-calorie malnutrition ([place]) 06/05/2015  . Essential hypertension 08/19/2014  . Cerebral infarction due to unspecified mechanism 08/19/2014  . R Bell's palsy 07/15/2014  . E-coli UTI 07/14/2014  . Cerebral infarction ([place]) 07/12/2014  . Hypokalemia 07/12/2014  . Dementia 07/12/2014  . Insomnia 03/01/2012  . B12 deficiency 02/22/2012  . Memory loss 02/09/2012  . Hyperlipidemia 02/08/2012  . HTN (hypertension) 02/08/2012    Past Surgical History:  Procedure Laterality Date    . APPENDECTOMY    . TONSILLECTOMY      OB History    No data available       Home Medications    Prior to Admission medications   Medication Sig Start Date End Date Taking? Authorizing Provider  atorvastatin (LIPITOR) 10 MG tablet TAKE 1 TABLET (10 MG TOTAL) BY MOUTH DAILY. 02/01/17  Yes Roma Schanz R, DO  clopidogrel (PLAVIX) 75 MG tablet Take 1 tablet (75 mg total) by mouth daily. 02/01/17  Yes Roma Schanz R, DO  donepezil (ARICEPT) 23 MG TABS tablet Take 1 tablet (23 mg total) by mouth at bedtime. 02/01/17  Yes Roma Schanz R, DO  lisinopril (PRINIVIL,ZESTRIL) 10 MG tablet Take 1 tablet (10 mg total) by mouth daily. 02/01/17  Yes Ann Held, DO    Family History Family History  Problem Relation Age of Onset  . Stroke Father     Social History Social History  Substance Use Topics  . Smoking status: Never Smoker  . Smokeless tobacco: Never Used  . Alcohol use No     Allergies   Patient has no known allergies.   Review of Systems Review of Systems  Unable to perform ROS: Dementia     Physical Exam Updated Vital Signs BP (!) 219/98 (BP Location: Right Arm)   Pulse 86   Temp 98.9 F (37.2  C) (Oral)   Resp (!) 24   SpO2 96%   Physical Exam  Constitutional: She appears well-developed and well-nourished.  HENT:  Head: Normocephalic and atraumatic.  Right Ear: External ear normal.  Left Ear: External ear normal.  Nose: Nose normal.  Eyes: Right eye exhibits no discharge. Left eye exhibits no discharge.  Cardiovascular: Normal rate, regular rhythm and normal heart sounds.   Pulses:      Dorsalis pedis pulses are 2+ on the right side, and 2+ on the left side.  Pulmonary/Chest: Effort normal and breath sounds normal.  Abdominal: Soft. There is no tenderness.  Musculoskeletal:       Right hip: She exhibits normal range of motion and no tenderness.       Left hip: She exhibits decreased range of motion and tenderness.        Left upper leg: She exhibits no tenderness.  Feet warm/well perfused. Normal strength/sensation  Neurological: She is alert. She is disoriented.  Awake, alert, demented. Moves all 4 extremities to commands  Skin: Skin is warm and dry.  Nursing note and vitals reviewed.    ED Treatments / Results  Labs (all labs ordered are listed, but only abnormal results are displayed) Labs Reviewed  BASIC METABOLIC PANEL - Abnormal; Notable for the following:       Result Value   Potassium 3.4 (*)    Glucose, Bld 128 (*)    All other components within normal limits  CBC WITH DIFFERENTIAL/PLATELET - Abnormal; Notable for the following:    Hemoglobin 15.1 (*)    Neutro Abs 7.8 (*)    Monocytes Absolute 1.4 (*)    All other components within normal limits  URINE CULTURE  PROTIME-INR  URINALYSIS, ROUTINE W REFLEX MICROSCOPIC  TYPE AND SCREEN    EKG  EKG Interpretation  Date/Time:  Sunday July 24 2017 12:57:40 EDT Ventricular Rate:  93 PR Interval:    QRS Duration: 127 QT Interval:  417 QTC Calculation: 482 R Axis:   82 Text Interpretation:  Sinus rhythm Atrial premature complexes in couplets Right bundle branch block changes noted compared to Dec 2017 Confirmed by Sherwood Gambler [phone]) on 07/24/2017 1:21:32 PM       Radiology Dg Hip Unilat With Pelvis 2-3 Views Left  Result Date: 07/24/2017 CLINICAL DATA:  83 year old female with persistent left hip pain after falling EXAM: DG HIP (WITH OR WITHOUT PELVIS) 2-3V LEFT COMPARISON:  None. FINDINGS: Impacted left subcapital femoral neck fracture. The femoral head remains located. The right hip is intact. The visualized bony pelvis is intact. Of note, the bones are extremely osteopenic in appearance. IMPRESSION: Impacted left subcapital femoral neck fracture. Electronically Signed   By: Jacqulynn Cadet M.D.   On: 07/24/2017 13:48    Procedures Procedures (including critical care time)  Medications Ordered in ED Medications    fentaNYL (SUBLIMAZE) injection 50 mcg (not administered)  0.45 % sodium chloride infusion (not administered)  metoprolol tartrate (LOPRESSOR) injection 5 mg (not administered)  heparin injection 5,000 Units (not administered)  HYDROmorphone (DILAUDID) injection 0.5 mg (not administered)  cefTRIAXone (ROCEPHIN) 1 g in dextrose 5 % 50 mL IVPB (not administered)  hydrALAZINE (APRESOLINE) injection 10 mg (10 mg Intravenous Given 07/24/17 1551)     Initial Impression / Assessment and Plan / ED Course  I have reviewed the triage vital signs and the nursing notes.  Pertinent labs & imaging results that were available during my care of the patient were reviewed by me and considered  in my medical decision making (see chart for details).     Discussed with son, updated on the subcapital femoral fracture seen on x-ray. Discussed with Dr. Percell Miller, who asked for patient to be admitted to the hospitalist service and transferred to [facility]. He will see family in the morning and will likely operate in the afternoon. Dr Rodena Piety asks for IV hydralazine for BP control. Admit to hospitalist service.   Final Clinical Impressions(s) / ED Diagnoses   Final diagnoses:  Subcapital fracture of hip, left, closed, initial encounter [facility])    New Prescriptions New Prescriptions   No medications on file     Sherwood Gambler, MD 07/24/17 1627

## 2017-07-25 ENCOUNTER — Encounter (HOSPITAL_COMMUNITY)
Admission: EM | Disposition: A | Discharge status: Hospice | Payer: Self-pay | Source: Home / Self Care | Attending: Internal Medicine

## 2017-07-25 ENCOUNTER — Encounter (HOSPITAL_COMMUNITY): Payer: Self-pay | Admitting: Anesthesiology

## 2017-07-25 DIAGNOSIS — F0390 Unspecified dementia without behavioral disturbance: Secondary | ICD-10-CM

## 2017-07-25 DIAGNOSIS — G309 Alzheimer's disease, unspecified: Secondary | ICD-10-CM

## 2017-07-25 DIAGNOSIS — S72002A Fracture of unspecified part of neck of left femur, initial encounter for closed fracture: Secondary | ICD-10-CM | POA: Diagnosis present

## 2017-07-25 DIAGNOSIS — Z515 Encounter for palliative care: Secondary | ICD-10-CM

## 2017-07-25 DIAGNOSIS — N390 Urinary tract infection, site not specified: Secondary | ICD-10-CM

## 2017-07-25 DIAGNOSIS — Z7189 Other specified counseling: Secondary | ICD-10-CM

## 2017-07-25 DIAGNOSIS — F0280 Dementia in other diseases classified elsewhere without behavioral disturbance: Secondary | ICD-10-CM

## 2017-07-25 DIAGNOSIS — I10 Essential (primary) hypertension: Secondary | ICD-10-CM

## 2017-07-25 LAB — MRSA PCR SCREENING: MRSA by PCR: NEGATIVE

## 2017-07-25 LAB — ABO/RH: ABO/RH(D): O POS

## 2017-07-25 SURGERY — FIXATION, FEMUR, NECK, PERCUTANEOUS, USING SCREW
Anesthesia: General | Laterality: Left

## 2017-07-25 MED ORDER — ACETAMINOPHEN 650 MG RE SUPP: 650.0000 mg | RECTAL | Status: DC | PRN

## 2017-07-25 MED ORDER — ATORVASTATIN CALCIUM 10 MG PO TABS: 10.0000 mg | ORAL_TABLET | Freq: Every day | ORAL | Status: DC

## 2017-07-25 MED ORDER — ENOXAPARIN SODIUM 40 MG/0.4ML ~~LOC~~ SOLN
40.0000 mg | SUBCUTANEOUS | Status: DC
  Filled 2017-07-25: qty 0.4

## 2017-07-25 MED ORDER — BIOTENE DRY MOUTH MT LIQD: 15.0000 mL | OROMUCOSAL | Status: DC | PRN

## 2017-07-25 MED ORDER — ORAL CARE MOUTH RINSE
15.0000 mL | Freq: Two times a day (BID) | OROMUCOSAL | Status: DC
  Administered 2017-07-25 – 2017-07-26 (×3): 15 mL via OROMUCOSAL

## 2017-07-25 MED ORDER — LISINOPRIL 10 MG PO TABS
10.0000 mg | ORAL_TABLET | Freq: Every day | ORAL | Status: DC
  Filled 2017-07-25: qty 1

## 2017-07-25 MED ORDER — DONEPEZIL HCL 23 MG PO TABS
23.0000 mg | ORAL_TABLET | Freq: Every day | ORAL | Status: DC
  Filled 2017-07-25: qty 1

## 2017-07-25 MED ORDER — MORPHINE SULFATE (CONCENTRATE) 10 MG/0.5ML PO SOLN: 5.0000 mg | ORAL | Status: DC | PRN

## 2017-07-25 MED ORDER — HALOPERIDOL LACTATE 2 MG/ML PO CONC
1.0000 mg | Freq: Four times a day (QID) | ORAL | Status: DC | PRN
  Filled 2017-07-25: qty 0.5

## 2017-07-25 MED ORDER — POLYVINYL ALCOHOL 1.4 % OP SOLN
1.0000 [drp] | Freq: Four times a day (QID) | OPHTHALMIC | Status: DC | PRN
  Filled 2017-07-25: qty 15

## 2017-07-25 NOTE — Progress Notes (Signed)
PROGRESS NOTE    Kimberly Craig   RCV:[id number]  DOB: 08/11/1930  DOA: 07/24/2017 PCP: Ann Held, DO   Brief Narrative:  Kimberly Craig is an 84 y/o with dementia, chronic complaints of pain, mostly wheelchair bound & CVAs who presents from SNF after complaining of left hip pain. She fell about 4 days prior to coming to the ER   Subjective: Non-verbal this AM. Son at bedside states Mom has very poor cognition, barely able to stand and eats very poorly. He is hesitant to put her through surgery and would like our opinions on the matter.   Assessment & Plan:   Principal Problem:   Fracture of femoral neck, left - per ortho- I would consider pain control and palliative care whom I will consult - Plavix on hold - moderate risk for surgery   Active Problems:   Recurrent UTIs - UA is positive and she is unable to tell if she is symptomatic - I would not treat a positive UA unless she has other signs of infection ie., hematuria,  fever or leukocytosis - d/c Rocephin  Dehydration per clinical exam on admission - started on 150 cc/hr or 1/2 NS    HTN (hypertension) - ACE placed on hold- will resume this with holding parameters    Dementia   Alzheimer disease - Aricepts  H/o CVAs- wheelchair bound - Plavix on hold - cont Lipitor    DVT prophylaxis: Per ortho Code Status: DNR Family Communication:  Disposition Plan: return to SNF when stable Consultants:   ortho Procedures:    Antimicrobials:  Anti-infectives    Start     Dose/Rate Route Frequency Ordered Stop   07/25/17 1800  cefTRIAXone (ROCEPHIN) 1 g in dextrose 5 % 50 mL IVPB     1 g 100 mL/hr over 30 Minutes Intravenous Every 24 hours 07/24/17 1833     07/24/17 1845  cefTRIAXone (ROCEPHIN) 1 g in dextrose 5 % 50 mL IVPB     1 g 100 mL/hr over 30 Minutes Intravenous  Once 07/24/17 1627 07/24/17 2232       Objective: Vitals:   07/24/17 1825 07/24/17 2100 07/25/17 0200 07/25/17 0500  BP: (!)  154/88 (!) 181/91 (!) 170/82 138/83  Pulse: 90 84 73 80  Resp: 18 (!) 22  18  Temp: 98.6 F (37 C) 98.5 F (36.9 C)  98 F (36.7 C)  TempSrc: Axillary Axillary  Axillary  SpO2: 95% 98% 97% 97%    Intake/Output Summary (Last 24 hours) at 07/25/17 0835 Last data filed at 07/25/17 0438  Gross per 24 hour  Intake             1685 ml  Output               50 ml  Net             1635 ml   There were no vitals filed for this visit.  Examination: General exam: Appears comfortable - non verbal and does not follow commands- moans when left hip is gently touched HEENT: PERRLA, oral mucosa moist, no sclera icterus or thrush Respiratory system: Clear to auscultation. Respiratory effort normal. Cardiovascular system: S1 & S2 heard, RRR.  No murmurs  Gastrointestinal system: Abdomen soft, non-tender, nondistended. Normal bowel sound. No organomegaly Central nervous system: Alert - normal tone  Extremities: No cyanosis, clubbing or edema Skin: No rashes or ulcers Psychiatry:  Difficult to assess mood & affect  Data Reviewed: I have personally reviewed following labs and imaging studies  CBC:  Recent Labs Lab 07/24/17 1431  WBC 10.2  NEUTROABS 7.8*  HGB 15.1*  HCT 44.9  MCV 95.1  PLT 409   Basic Metabolic Panel:  Recent Labs Lab 07/24/17 1431  NA 145  K 3.4*  CL 102  CO2 32  GLUCOSE 128*  BUN 17  CREATININE 0.61  CALCIUM 9.5   GFR: CrCl cannot be calculated (Unknown ideal weight.). Liver Function Tests: No results for input(s): AST, ALT, ALKPHOS, BILITOT, PROT, ALBUMIN in the last 168 hours. No results for input(s): LIPASE, AMYLASE in the last 168 hours. No results for input(s): AMMONIA in the last 168 hours. Coagulation Profile:  Recent Labs Lab 07/24/17 1431  INR 1.10   Cardiac Enzymes: No results for input(s): CKTOTAL, CKMB, CKMBINDEX, TROPONINI in the last 168 hours. BNP (last 3 results) No results for input(s): PROBNP in the last 8760  hours. HbA1C: No results for input(s): HGBA1C in the last 72 hours. CBG: No results for input(s): GLUCAP in the last 168 hours. Lipid Profile: No results for input(s): CHOL, HDL, LDLCALC, TRIG, CHOLHDL, LDLDIRECT in the last 72 hours. Thyroid Function Tests: No results for input(s): TSH, T4TOTAL, FREET4, T3FREE, THYROIDAB in the last 72 hours. Anemia Panel: No results for input(s): VITAMINB12, FOLATE, FERRITIN, TIBC, IRON, RETICCTPCT in the last 72 hours. Urine analysis:    Component Value Date/Time   COLORURINE AMBER (A) 07/24/2017 1354   APPEARANCEUR CLOUDY (A) 07/24/2017 1354   LABSPEC 1.016 07/24/2017 1354   PHURINE 6.0 07/24/2017 1354   GLUCOSEU NEGATIVE 07/24/2017 1354   GLUCOSEU NEGATIVE 02/10/2012 0852   HGBUR SMALL (A) 07/24/2017 1354   BILIRUBINUR NEGATIVE 07/24/2017 1354   KETONESUR 5 (A) 07/24/2017 1354   PROTEINUR 100 (A) 07/24/2017 1354   UROBILINOGEN 1.0 08/08/2015 1201   NITRITE NEGATIVE 07/24/2017 1354   LEUKOCYTESUR SMALL (A) 07/24/2017 1354   Sepsis Labs: @LABRCNTIP (procalcitonin:4,lacticidven:4) ) Recent Results (from the past 240 hour(s))  MRSA PCR Screening     Status: None   Collection Time: 07/24/17 11:47 PM  Result Value Ref Range Status   MRSA by PCR NEGATIVE NEGATIVE Final    Comment:        The GeneXpert MRSA Assay (FDA approved for NASAL specimens only), is one component of a comprehensive MRSA colonization surveillance program. It is not intended to diagnose MRSA infection nor to guide or monitor treatment for MRSA infections.          Radiology Studies: Dg Hip Unilat With Pelvis 2-3 Views Left  Result Date: 07/24/2017 CLINICAL DATA:  83 year old female with persistent left hip pain after falling EXAM: DG HIP (WITH OR WITHOUT PELVIS) 2-3V LEFT COMPARISON:  None. FINDINGS: Impacted left subcapital femoral neck fracture. The femoral head remains located. The right hip is intact. The visualized bony pelvis is intact. Of note, the bones  are extremely osteopenic in appearance. IMPRESSION: Impacted left subcapital femoral neck fracture. Electronically Signed   By: Jacqulynn Cadet M.D.   On: 07/24/2017 13:48      Scheduled Meds: . heparin  5,000 Units Subcutaneous Q8H  . metoprolol tartrate  5 mg Intravenous Q6H   Continuous Infusions: . sodium chloride 150 mL/hr at 07/24/17 1724  . cefTRIAXone (ROCEPHIN)  IV       LOS: 1 day    Time spent in minutes: 35    Debbe Odea, MD [facility] Pager: [email or web address] Password [email or web address] 07/25/2017, 8:35 AM

## 2017-07-25 NOTE — Consult Note (Signed)
Consultation Note Date: 07/25/2017   Patient Name: Kimberly Craig  DOB: 08-Dec-1929  MRN: [id number]  Age / Sex: 83 y.o., female  PCP: Carollee Herter, Alferd Apa, DO Referring Physician: Debbe Odea, MD  Reason for Consultation: Establishing goals of care  HPI/Patient Profile: 83 y.o. female  with past medical history of advanced dementia, HTN, HLD, stroke, arthritis, hepatitis, depression admitted on 07/24/2017 with left hip pain and fracture after fall. After much discussion family has opted to forego surgery and palliative requested to assist with this decision making and support.   Clinical Assessment and Goals of Care: I met today at Kimberly Craig's bedside with her son, Kimberly Craig. Kimberly Craig explains that his mother has had very poor QOL that has been significantly declining this year. She cannot walk (stands for a few seconds with great effort), poor po intake, and needing increasing care from caregivers. She has been unable to express her needs for some time.   Kimberly Craig further tells Kimberly Craig that after discussion with the doctors and his family they have decided to not pursue surgery. We discussed where to go from here. We discussed poor prognosis as she is not really eating or drinking but a couple sips or bites a day. We also discussed challenging pain management as she is unable to tell Kimberly Craig when she is having pain. We also discussed transition to hospice facility for EOL care. After much discussion Kimberly Craig agrees that this is the best option. He tells Kimberly Craig that he knows that this is the right decision for his mother. Emotional support provided.   Primary Decision Maker NEXT OF KIN son Kimberly Craig    SUMMARY OF RECOMMENDATIONS   - DNR - Comfort care - Transition to hospice facility  Code Status/Advance Care Planning:  DNR   Symptom Management:   Pain/dyspnea: Roxanol 5 mg SL every 2 hours prn. Dilaudid 0.5 mg IV every 4  hours prn severe pain.   Tylenol prn fever.   Haldol solution prn agitation.   Palliative Prophylaxis:   Aspiration, Delirium Protocol, Frequent Pain Assessment, Oral Care and Turn Reposition  Additional Recommendations (Limitations, Scope, Preferences):  Full Comfort Care  Psycho-social/Spiritual:   Desire for further Chaplaincy support:no  Additional Recommendations: Caregiving  Support/Resources, Education on Hospice and Grief/Bereavement Support  Prognosis:   < 2 weeks  Discharge Planning: Hospice facility      Primary Diagnoses: Present on Admission: . (Resolved) Hip fracture ([place]) . Fracture of femoral neck, left ([place]) . Dementia . Alzheimer disease . Recurrent UTI . HTN (hypertension)   I have reviewed the medical record, interviewed the patient and family, and examined the patient. The following aspects are pertinent.  Past Medical History:  Diagnosis Date  . Arthritis    "hands" (08/08/2015)  . Dementia   . Depression   . Hepatitis    not sure which  . Hyperlipidemia   . Hypertension   . Insomnia   . Skin cancer    "don't know what kind or where; it's in my skin" (08/08/2015)  .  Stroke [facility]) 2015   denies residual on 08/08/2015  . Uterine cancer [facility])    Social History   Social History  . Marital status: Widowed    Spouse name: N/A  . Number of children: 3  . Years of education: college   Occupational History  . retired    Social History Main Topics  . Smoking status: Never Smoker  . Smokeless tobacco: Never Used  . Alcohol use No  . Drug use: No  . Sexual activity: Not Currently   Other Topics Concern  . None   Social History Narrative   ** Merged History Encounter **       Family History  Problem Relation Age of Onset  . Stroke Father    Scheduled Meds: . donepezil  23 mg Oral QHS  . lisinopril  10 mg Oral Daily  . mouth rinse  15 mL Mouth Rinse BID  . metoprolol tartrate  5 mg Intravenous Q6H   Continuous  Infusions: . sodium chloride 100 mL/hr at 07/25/17 0951   PRN Meds:.acetaminophen, antiseptic oral rinse, haloperidol, HYDROmorphone (DILAUDID) injection, morphine CONCENTRATE, polyvinyl alcohol No Known Allergies Review of Systems  Unable to perform ROS   Physical Exam  Constitutional: She appears well-developed. She appears lethargic.  Thin, frail  HENT:  Head: Normocephalic and atraumatic.  Cardiovascular: Normal rate and regular rhythm.   Pulmonary/Chest: Effort normal. No accessory muscle usage. No tachypnea. No respiratory distress.  Abdominal: Normal appearance.  Neurological: She appears lethargic. She is disoriented.  Nursing note and vitals reviewed.   Vital Signs: BP (!) 169/71 (BP Location: Right Arm)   Pulse 68   Temp 98.2 F (36.8 C) (Oral)   Resp 18   SpO2 98%  Pain Assessment: PAINAD       SpO2: SpO2: 98 % O2 Device:SpO2: 98 % O2 Flow Rate: .   IO: Intake/output summary:  Intake/Output Summary (Last 24 hours) at 07/25/17 1636 Last data filed at 07/25/17 1400  Gross per 24 hour  Intake           2822.5 ml  Output              400 ml  Net           2422.5 ml    LBM: Last BM Date: 07/24/17 Baseline Weight:   Most recent weight:       Palliative Assessment/Data: 10%     Time Total: 50 min  Greater than 50%  of this time was spent counseling and coordinating care related to the above assessment and plan.  Signed by: Vinie Sill, NP Palliative Medicine Team Pager # [phone] (M-F 8a-5p) Team Phone # [phone] (Nights/Weekends)

## 2017-07-25 NOTE — Progress Notes (Signed)
Pt's son informed RN that he does not want his mother to have surgery. Left a voicemail for Dr. Percell Miller letting him know.   [place], Jerry Caras

## 2017-07-25 NOTE — Progress Notes (Signed)
Pt refused CHG bath, and heparin for DVT prophylactic  This shift, slept all night, denies pain , no distress or discomfort noticed, call light within reach, will continue to monitor

## 2017-07-25 NOTE — Progress Notes (Signed)
     Assessment / Plan: Principal Problem:   Fracture of femoral neck, left (HCC) Active Problems:   HTN (hypertension)   Dementia   Alzheimer disease   Recurrent UTI  Closed impacted left femoral neck fracture Dr. Alain Marion discussed surgical vs non-surgical options along with the risks and benefits of each with the patient's son in the morning 07/25/17.  The son ultimately determined that he does not want his mother to have surgery.  Non-operative / Palliative management  Weight Bearing: Non Weight Bearing (NWB) LLE VTE prophylaxis: Lovenox Dispo: per primary.  Palliative consult.  SNF when stable medically.  Follow up in the office with Dr. Alain Marion in 2 weeks.  Please call with questions.  Objective:   VITALS:   Vitals:   07/25/17 0500 07/25/17 1610 07/25/17 1617 07/25/17 1800  BP: 138/83 (!) 185/108 (!) 169/71 (!) 150/59  Pulse: 80 70 68 61  Resp: 18     Temp: 98 F (36.7 C) 98.2 F (36.8 C)    TempSrc: Axillary Oral    SpO2: 97% 98%     CBC Latest Ref Rng & Units 07/24/2017 02/01/2017 11/14/2016  WBC 4.0 - 10.5 K/uL 10.2 6.7 11.5(H)  Hemoglobin 12.0 - 15.0 g/dL 15.1(H) 13.6 15.1(H)  Hematocrit 36.0 - 46.0 % 44.9 41.6 46.1(H)  Platelets 150 - 400 K/uL 223 230.0 220   BMP Latest Ref Rng & Units 07/24/2017 02/01/2017 11/14/2016  Glucose 65 - 99 mg/dL 128(H) 107(H) 130(H)  BUN 6 - 20 mg/dL 17 22 21(H)  Creatinine 0.44 - 1.00 mg/dL 0.61 0.69 0.56  Sodium 135 - 145 mmol/L 145 141 142  Potassium 3.5 - 5.1 mmol/L 3.4(L) 5.1 4.4  Chloride 101 - 111 mmol/L 102 104 106  CO2 22 - 32 mmol/L 32 30 28  Calcium 8.9 - 10.3 mg/dL 9.5 9.5 9.4   Intake/Output      09/09 0701 - 09/10 0700 09/10 0701 - 09/11 0700   P.O.  100   I.V. 1685 1097.5   IV Piggyback 0    Total Intake 1685 1197.5   Urine 50 475   Total Output 50 475   Net +1635 +722.5          Prudencio Burly III, PA-C 07/25/2017, 6:57 PM

## 2017-07-25 NOTE — Social Work (Signed)
CSW was advised by palliative team that family has selected [facility] to follow family. CSW called and spoke with Manus Gunning who will f/u with son to obtain information and the hospital liaison will schedule meeting for tomorrow.  CSW will continue to follow.  Elissa Hefty, LCSW Clinical Social Worker [phone]

## 2017-07-25 NOTE — Consult Note (Signed)
ORTHOPAEDIC CONSULTATION  REQUESTING PHYSICIAN: Debbe Odea, MD  Chief Complaint: left hip pain  Assessment / Plan: Principal Problem:   Fracture of femoral neck, left (HCC) Active Problems:   Hip fracture (HCC)  Closed impacted left femoral neck fracture Would recommend pinning to stabilize fracture, avoid more invasive surgery, and prevent further displacement. Patient is not interactive and family is not present - Dr. Percell Miller will plan to contact family to discuss surgical versus nonsurgical options.  Plan for Operative fixation this afternoon after discussion with family. -NPO -Medicine team to admit and perform pre-op clearance -Type and Cross for 2 units held for OR -PT/OT post op -Will ammend WB status postop, bedrest for now -Foley for comfort to be removed POD 1/2 -Likely to require Rehab or SNF placement upon discharge.  -VTE prophylaxis: SCD   HPI: Kimberly Craig is a 83 y.o. female who complains of Left hip pain. She has end-stage dementia and apparently fell 4 days ago. He presented to the ED where imaging shows an impacted left femoral neck fracture. Orthopedics was consulted for evaluation.  Past Medical History:  Diagnosis Date  . Arthritis    "hands" (08/08/2015)  . Dementia   . Depression   . Hepatitis    not sure which  . Hyperlipidemia   . Hypertension   . Insomnia   . Skin cancer    "don't know what kind or where; it's in my skin" (08/08/2015)  . Stroke [facility]) 2015   denies residual on 08/08/2015  . Uterine cancer [facility])    Past Surgical History:  Procedure Laterality Date  . APPENDECTOMY    . TONSILLECTOMY     Social History   Social History  . Marital status: Widowed    Spouse name: N/A  . Number of children: 3  . Years of education: college   Occupational History  . retired    Social History Main Topics  . Smoking status: Never Smoker  . Smokeless tobacco: Never Used  . Alcohol use No  . Drug use: No  . Sexual activity:  Not Currently   Other Topics Concern  . None   Social History Narrative   ** Merged History Encounter **       Family History  Problem Relation Age of Onset  . Stroke Father    No Known Allergies Prior to Admission medications   Medication Sig Start Date End Date Taking? Authorizing Provider  atorvastatin (LIPITOR) 10 MG tablet TAKE 1 TABLET (10 MG TOTAL) BY MOUTH DAILY. 02/01/17  Yes Roma Schanz R, DO  clopidogrel (PLAVIX) 75 MG tablet Take 1 tablet (75 mg total) by mouth daily. 02/01/17  Yes Roma Schanz R, DO  donepezil (ARICEPT) 23 MG TABS tablet Take 1 tablet (23 mg total) by mouth at bedtime. 02/01/17  Yes Roma Schanz R, DO  lisinopril (PRINIVIL,ZESTRIL) 10 MG tablet Take 1 tablet (10 mg total) by mouth daily. 02/01/17  Yes Ann Held, DO   Dg Hip Unilat With Pelvis 2-3 Views Left  Result Date: 07/24/2017 CLINICAL DATA:  83 year old female with persistent left hip pain after falling EXAM: DG HIP (WITH OR WITHOUT PELVIS) 2-3V LEFT COMPARISON:  None. FINDINGS: Impacted left subcapital femoral neck fracture. The femoral head remains located. The right hip is intact. The visualized bony pelvis is intact. Of note, the bones are extremely osteopenic in appearance. IMPRESSION: Impacted left subcapital femoral neck fracture. Electronically Signed   By: Dellis Filbert.D.  On: 07/24/2017 13:48    Positive ROS: All other systems have been reviewed and were otherwise negative with the exception of those mentioned in the HPI and as above.  Objective: Labs cbc  Recent Labs  07/24/17 1431  WBC 10.2  HGB 15.1*  HCT 44.9  PLT 223    Labs inflam No results for input(s): CRP in the last 72 hours.  Invalid input(s): ESR  Labs coag  Recent Labs  07/24/17 1431  INR 1.10     Recent Labs  07/24/17 1431  NA 145  K 3.4*  CL 102  CO2 32  GLUCOSE 128*  BUN 17  CREATININE 0.61  CALCIUM 9.5    Physical Exam: Vitals:   07/25/17 0200  07/25/17 0500  BP: (!) 170/82 138/83  Pulse: 73 80  Resp:  18  Temp:  98 F (36.7 C)  SpO2: 97% 97%   General: no acute distress. Supine in bed. Mental status: patient not interactive. Intermittently/inconsistently nods head to questions Respiratory: No cyanosis, no use of accessory musculature Cardiovascular: No pedal edema GI: Abdomen is soft and non-tender, non-distended. Skin: Warm and dry.  No lesions in the area of chief complaint  Extremities: Warm and well perfused w/o edema Psychiatric: Patient is not competent for consent with normal mood and affect  MUSCULOSKELETAL:  Left lower extremity without lesion or ecchymosis. Exam significantly limited as patient is not interacting. She does not respond to commands or questions. Other extremities are atraumatic with painless ROM and NVI.   Prudencio Burly III PA-C 07/25/2017 7:44 AM

## 2017-07-26 DIAGNOSIS — S72002D Fracture of unspecified part of neck of left femur, subsequent encounter for closed fracture with routine healing: Secondary | ICD-10-CM

## 2017-07-26 LAB — URINE CULTURE

## 2017-07-26 MED ORDER — MORPHINE SULFATE (CONCENTRATE) 10 MG/0.5ML PO SOLN
5.0000 mg | Freq: Three times a day (TID) | ORAL | Status: DC
  Administered 2017-07-26: 5 mg via ORAL
  Filled 2017-07-26 (×3): qty 0.5

## 2017-07-26 NOTE — Care Management Note (Signed)
Case Management Note  Patient Details  Name: Kimberly Craig MRN: [id number] Date of Birth: [birth year]  Subjective/Objective:                 83 y.o. female  with past medical history of advanced dementia, HTN, HLD, stroke, arthritis, hepatitis, depression admitted on 07/24/2017 with left hip pain and fracture after fall. After much discussion family has opted to forego surgery and palliative requested to assist with this decision making and support.    Action/Plan:  Anticipate DC to Residential hospice, likely [facility].  Expected Discharge Date:                  Expected Discharge Plan:  [place]  In-House Referral:  Clinical Social Work  Discharge planning Services  CM Consult  Post Acute Care Choice:    Choice offered to:     DME Arranged:    DME Agency:     HH Arranged:    [place] Agency:     Status of Service:  In process, will continue to follow  If discussed at Long Length of Stay Meetings, dates discussed:    Additional Comments:  Carles Collet, RN 07/26/2017, 11:19 AM

## 2017-07-26 NOTE — Progress Notes (Signed)
Palliative Medicine RN Note: Follow up after hospice meeting and order changes by PMT NP. Pt is sleeping but wakes easily. She is confused but smiling. States pain is better in her "thigh" and that she is "breathing much better now." IV fluids are still running, but RN is about to stop them. Plan f/u by PMT in the am.  Marjie Skiff. Liya Strollo, RN, BSN, [facility] 07/26/2017 3:48 PM Cell [phone] 8:00-4:00 Monday-Friday Office [phone]

## 2017-07-26 NOTE — Social Work (Signed)
CSW will continue to follow. Hospice will follow up with patient tomorrow for placement.  CSW will provide support as needed.  Elissa Hefty, LCSW Clinical Social Worker [phone]

## 2017-07-26 NOTE — Discharge Summary (Signed)
Physician Discharge Summary  Kimberly Craig PJK:[id number] DOB: 1930-01-20 DOA: 07/24/2017  PCP: Ann Held, DO  Admit date: 07/24/2017 Discharge date: 07/26/2017  Admitted From: SNF Disposition:  Hospice home   Recommendations for Outpatient Follow-up:  1. Please order Roxanol for hip fracture  Discharge Condition:  stable   CODE STATUS:  DNR   Diet recommendation:  Regular diet Consultations:  Ortho  Palliative care    Discharge Diagnoses:  Principal Problem:   Fracture of femoral neck, left (HCC) Active Problems:   HTN (hypertension)   Alzheimer disease   Recurrent UTIs   Goals of care, counseling/discussion   Palliative care encounter    Subjective: Non-verbal  Brief Summary: Kimberly Craig is an 83 y/o with dementia, chronic complaints of pain, mostly wheelchair bound & CVAs who presents from SNF after complaining of left hip pain. She fell about 4 days prior to coming to the ER  Hospital Course:  Principal Problem:   Fracture of femoral neck, left - poor oral intake, limited mobility at baseline and significant dementia - the son has spoken with [place] and the surgeon in detail and has decided, due to poor quality of life, to forgo surgery and keep out of pain. She is appropriate for hospice home - Plavix on hold  Active Problems:   Recurrent UTIs - UA is positive and she is unable to tell if she is symptomatic - I decided not to treat a positive UA - no signs of infection ie., hematuria,  fever or leukocytosis - d/c'd Rocephin  Dehydration per clinical exam on admission - likely due to poor oral intake - given IV Fluid initially and subsequently turned off     HTN (hypertension) - d/c ACE      Dementia   Alzheimer disease - [place]  H/o CVAs- wheelchair bound - d/c Plavix and Lipitor     Discharge Instructions   Allergies as of 07/26/2017   No Known Allergies     Medication List    STOP taking these medications    atorvastatin 10 MG tablet Commonly known as:  LIPITOR   clopidogrel 75 MG tablet Commonly known as:  PLAVIX   lisinopril 10 MG tablet Commonly known as:  PRINIVIL,ZESTRIL     TAKE these medications   donepezil 23 MG Tabs tablet Commonly known as:  ARICEPT Take 1 tablet (23 mg total) by mouth at bedtime.       No Known Allergies   Procedures/Studies:    Dg Hip Unilat With Pelvis 2-3 Views Left  Result Date: 07/24/2017 CLINICAL DATA:  83 year old female with persistent left hip pain after falling EXAM: DG HIP (WITH OR WITHOUT PELVIS) 2-3V LEFT COMPARISON:  None. FINDINGS: Impacted left subcapital femoral neck fracture. The femoral head remains located. The right hip is intact. The visualized bony pelvis is intact. Of note, the bones are extremely osteopenic in appearance. IMPRESSION: Impacted left subcapital femoral neck fracture. Electronically Signed   By: Jacqulynn Cadet M.D.   On: 07/24/2017 13:48       Discharge Exam: Vitals:   07/26/17 0619 07/26/17 1300  BP: (!) 154/63 (!) 204/75  Pulse: 64 71  Resp:    Temp:  99 F (37.2 C)  SpO2:     Vitals:   07/25/17 2010 07/26/17 0057 07/26/17 0619 07/26/17 1300  BP: (!) 179/62 (!) 152/71 (!) 154/63 (!) 204/75  Pulse: 63 68 64 71  Resp: 18     Temp: 97.9 F (36.6 C)  71 F (37.2 C)  TempSrc: Axillary   Oral  SpO2: 99%       General: Pt is alert, awake, not in acute distress Cardiovascular: RRR, S1/S2 +, no rubs, no gallops Respiratory: CTA bilaterally, no wheezing, no rhonchi Abdominal: Soft, NT, ND, bowel sounds + Extremities: no edema, no cyanosis    The results of significant diagnostics from this hospitalization (including imaging, microbiology, ancillary and laboratory) are listed below for reference.     Microbiology: Recent Results (from the past 240 hour(s))  Culture, Urine     Status: Abnormal   Collection Time: 07/24/17  4:17 PM  Result Value Ref Range Status   Specimen Description URINE,  CATHETERIZED  Final   Special Requests NONE  Final   Culture >=100,000 COLONIES/mL KLEBSIELLA PNEUMONIAE (A)  Final   Report Status 07/26/2017 FINAL  Final   Organism ID, Bacteria KLEBSIELLA PNEUMONIAE (A)  Final      Susceptibility   Klebsiella pneumoniae - MIC*    AMPICILLIN RESISTANT Resistant     CEFAZOLIN <=4 SENSITIVE Sensitive     CEFTRIAXONE <=1 SENSITIVE Sensitive     CIPROFLOXACIN <=0.25 SENSITIVE Sensitive     GENTAMICIN <=1 SENSITIVE Sensitive     IMIPENEM <=0.25 SENSITIVE Sensitive     NITROFURANTOIN <=16 SENSITIVE Sensitive     TRIMETH/SULFA <=20 SENSITIVE Sensitive     AMPICILLIN/SULBACTAM 4 SENSITIVE Sensitive     PIP/TAZO <=4 SENSITIVE Sensitive     Extended ESBL NEGATIVE Sensitive     * >=100,000 COLONIES/mL KLEBSIELLA PNEUMONIAE  MRSA PCR Screening     Status: None   Collection Time: 07/24/17 11:47 PM  Result Value Ref Range Status   MRSA by PCR NEGATIVE NEGATIVE Final    Comment:        The GeneXpert MRSA Assay (FDA approved for NASAL specimens only), is one component of a comprehensive MRSA colonization surveillance program. It is not intended to diagnose MRSA infection nor to guide or monitor treatment for MRSA infections.      Labs: BNP (last 3 results) No results for input(s): BNP in the last 8760 hours. Basic Metabolic Panel:  Recent Labs Lab 07/24/17 1431  NA 145  K 3.4*  CL 102  CO2 32  GLUCOSE 128*  BUN 17  CREATININE 0.61  CALCIUM 9.5   Liver Function Tests: No results for input(s): AST, ALT, ALKPHOS, BILITOT, PROT, ALBUMIN in the last 168 hours. No results for input(s): LIPASE, AMYLASE in the last 168 hours. No results for input(s): AMMONIA in the last 168 hours. CBC:  Recent Labs Lab 07/24/17 1431  WBC 10.2  NEUTROABS 7.8*  HGB 15.1*  HCT 44.9  MCV 95.1  PLT 223   Cardiac Enzymes: No results for input(s): CKTOTAL, CKMB, CKMBINDEX, TROPONINI in the last 168 hours. BNP: Invalid input(s): POCBNP CBG: No results for  input(s): GLUCAP in the last 168 hours. D-Dimer No results for input(s): DDIMER in the last 72 hours. Hgb A1c No results for input(s): HGBA1C in the last 72 hours. Lipid Profile No results for input(s): CHOL, HDL, LDLCALC, TRIG, CHOLHDL, LDLDIRECT in the last 72 hours. Thyroid function studies No results for input(s): TSH, T4TOTAL, T3FREE, THYROIDAB in the last 72 hours.  Invalid input(s): FREET3 Anemia work up No results for input(s): VITAMINB12, FOLATE, FERRITIN, TIBC, IRON, RETICCTPCT in the last 72 hours. Urinalysis    Component Value Date/Time   COLORURINE AMBER (A) 07/24/2017 1354   APPEARANCEUR CLOUDY (A) 07/24/2017 1354   LABSPEC 1.016 07/24/2017 1354  PHURINE 6.0 07/24/2017 1354   GLUCOSEU NEGATIVE 07/24/2017 1354   GLUCOSEU NEGATIVE 02/10/2012 0852   HGBUR SMALL (A) 07/24/2017 1354   BILIRUBINUR NEGATIVE 07/24/2017 1354   KETONESUR 5 (A) 07/24/2017 1354   PROTEINUR 100 (A) 07/24/2017 1354   UROBILINOGEN 1.0 08/08/2015 1201   NITRITE NEGATIVE 07/24/2017 1354   LEUKOCYTESUR SMALL (A) 07/24/2017 1354   Sepsis Labs Invalid input(s): PROCALCITONIN,  WBC,  LACTICIDVEN Microbiology Recent Results (from the past 240 hour(s))  Culture, Urine     Status: Abnormal   Collection Time: 07/24/17  4:17 PM  Result Value Ref Range Status   Specimen Description URINE, CATHETERIZED  Final   Special Requests NONE  Final   Culture >=100,000 COLONIES/mL KLEBSIELLA PNEUMONIAE (A)  Final   Report Status 07/26/2017 FINAL  Final   Organism ID, Bacteria KLEBSIELLA PNEUMONIAE (A)  Final      Susceptibility   Klebsiella pneumoniae - MIC*    AMPICILLIN RESISTANT Resistant     CEFAZOLIN <=4 SENSITIVE Sensitive     CEFTRIAXONE <=1 SENSITIVE Sensitive     CIPROFLOXACIN <=0.25 SENSITIVE Sensitive     GENTAMICIN <=1 SENSITIVE Sensitive     IMIPENEM <=0.25 SENSITIVE Sensitive     NITROFURANTOIN <=16 SENSITIVE Sensitive     TRIMETH/SULFA <=20 SENSITIVE Sensitive     AMPICILLIN/SULBACTAM 4  SENSITIVE Sensitive     PIP/TAZO <=4 SENSITIVE Sensitive     Extended ESBL NEGATIVE Sensitive     * >=100,000 COLONIES/mL KLEBSIELLA PNEUMONIAE  MRSA PCR Screening     Status: None   Collection Time: 07/24/17 11:47 PM  Result Value Ref Range Status   MRSA by PCR NEGATIVE NEGATIVE Final    Comment:        The GeneXpert MRSA Assay (FDA approved for NASAL specimens only), is one component of a comprehensive MRSA colonization surveillance program. It is not intended to diagnose MRSA infection nor to guide or monitor treatment for MRSA infections.      Time coordinating discharge: Over 30 minutes  SIGNED:   Debbe Odea, MD  [facility] 07/26/2017, 2:31 PM Pager   If 7PM-7AM, please contact night-coverage [email or web address] Password [email or web address]

## 2017-07-26 NOTE — Consult Note (Signed)
[facility]--  Met with pt and her son at bedside. Explained hospice care and the services we offer at different levels of care SNF, ALF, or our hospice home in [facility].  The pt 's goals are in line with hospice philosohy and they are in agreement to moving the pt. However, we unfortunately do not have a bed to offer today at this time due to our hospice home being full. Spoke to [place] with Palliative Care. I also spoke to [employer] to let her know we would evaluate her again tomorrow and hopefully have a bed to offer. Webb Silversmith RN  Thank you for this referral and the opportunity to work with your hospital. Webb Silversmith RN [phone]

## 2017-07-26 NOTE — Social Work (Signed)
CSW met with son, [name] at bedside and discussed the referral to [facility]. Son in agreement and is awaiting for hospital liaison to discuss the plan for patient.  CSW will continue to follow.  Elissa Hefty, LCSW Clinical Social Worker [phone]

## 2017-07-27 MED ORDER — HYDRALAZINE HCL 20 MG/ML IJ SOLN
10.0000 mg | INTRAMUSCULAR | Status: DC | PRN
  Administered 2017-07-27: 10 mg via INTRAVENOUS
  Filled 2017-07-27: qty 1

## 2017-07-27 MED ORDER — MORPHINE SULFATE (PF) 4 MG/ML IV SOLN
1.0000 mg | INTRAVENOUS | Status: DC | PRN
  Administered 2017-07-27: 2 mg via INTRAVENOUS
  Filled 2017-07-27: qty 1

## 2017-07-27 MED ORDER — MORPHINE SULFATE (CONCENTRATE) 10 MG/0.5ML PO SOLN: 5.0000 mg | ORAL | Status: DC | PRN

## 2017-07-27 MED ORDER — HALOPERIDOL LACTATE 5 MG/ML IJ SOLN: 1.0000 mg | Freq: Four times a day (QID) | INTRAMUSCULAR | Status: DC | PRN

## 2017-07-27 MED ORDER — MORPHINE SULFATE (PF) 4 MG/ML IV SOLN
0.5000 mg | Freq: Three times a day (TID) | INTRAVENOUS | Status: DC
  Administered 2017-07-27: 0.52 mg via INTRAVENOUS
  Filled 2017-07-27: qty 1

## 2017-07-27 NOTE — Progress Notes (Signed)
Patient BP 178/85. Text paged Dr Myna Hidalgo. Awaiting response

## 2017-07-27 NOTE — Consult Note (Signed)
[place]:  Re-evaluated the pt and discussed with my Market researcher at [facility] in [place]. She was approved and we do have a bed available to offer the son. He did accept. He completed all paperwork and the pt will be transferring via ambulance. MD aware and also in agreement this is best move for pt. [facility] called by Jodean Lima. Thank you for the support and the opportunity to serve our community/ family durign this hard time. Webb Silversmith RN

## 2017-07-27 NOTE — Progress Notes (Signed)
Report called to Curt Jews at [facility]. All questions/concerns addressed. [facility] to transport. Will continue to monitor

## 2017-07-27 NOTE — Social Work (Signed)
CSW will set up transport to [place] via [place].  Hospital Liaison Cherice at hospital assisting with transition to facility.  Elissa Hefty, LCSW Clinical Social Worker [phone]

## 2017-07-27 NOTE — Social Work (Signed)
Clinical Social Worker facilitated patient discharge including contacting patient family and facility to confirm patient discharge plans.  Clinical information faxed to facility and family agreeable with plan.    CSW arranged ambulance transport via [facility] to [place]    RN to call [phone] to give report prior to discharge.  Clinical Social Worker will sign off for now as social work intervention is no longer needed. Please consult [name] again if new need arises.  Elissa Hefty, LCSW Clinical Social Worker [phone]

## 2017-07-27 NOTE — Progress Notes (Signed)
Daily Progress Note   Patient Name: Kimberly Craig       Date: 07/27/2017 DOB: [birth year]  Age: 83 y.o. MRN#: [id number] Attending Physician: Georgette Shell, MD Primary Care Physician: Carollee Herter, Alferd Apa, DO Admit Date: 07/24/2017  Reason for Consultation/Follow-up: Establishing goals of care  Subjective: Kimberly Craig awoke abruptly when I entered room. No family at bedside and she is nonverbal.   Length of Stay: 3  Current Medications: Scheduled Meds:  . mouth rinse  15 mL Mouth Rinse BID  .  morphine injection  0.52 mg Intravenous TID    Continuous Infusions:   PRN Meds: acetaminophen, antiseptic oral rinse, haloperidol lactate, hydrALAZINE, morphine injection **OR** morphine CONCENTRATE, polyvinyl alcohol  Physical Exam         Constitutional: She appears well-developed. She appears lethargic.  Thin, frail  HENT:  Head: Normocephalic and atraumatic.  Cardiovascular: Normal rate and regular rhythm.   Pulmonary/Chest: Effort normal. No accessory muscle usage. No tachypnea. No respiratory distress.  Abdominal: Normal appearance.  Neurological: She appears lethargic. She is disoriented.  Nursing note and vitals reviewed.   Vital Signs: BP (!) 178/85 (BP Location: Right Arm)   Pulse 68   Temp 98.3 F (36.8 C) (Axillary)   Resp 18   SpO2 97%  SpO2: SpO2: 97 % O2 Device: O2 Device: Not Delivered O2 Flow Rate:    Intake/output summary:  Intake/Output Summary (Last 24 hours) at 07/27/17 0919 Last data filed at 07/27/17 0830  Gross per 24 hour  Intake               80 ml  Output              250 ml  Net             -170 ml   LBM: Last BM Date: 07/24/17 Baseline Weight:   Most recent weight:         Palliative Assessment/Data: 20%      Patient Active  Problem List   Diagnosis Date Noted  . Fracture of femoral neck, left ([place]) 07/25/2017  . Goals of care, counseling/discussion   . Palliative care encounter   . Syncope 09/13/2016  . Alzheimer disease   . Recurrent UTI   . Near syncope   . UTI (lower urinary tract infection)  08/08/2015  . Bradycardia 08/08/2015  . History of TIA (transient ischemic attack) 08/08/2015  . Protein-calorie malnutrition ([place]) 06/05/2015  . Essential hypertension 08/19/2014  . Cerebral infarction due to unspecified mechanism 08/19/2014  . R Bell's palsy 07/15/2014  . E-coli UTI 07/14/2014  . Cerebral infarction ([place]) 07/12/2014  . Hypokalemia 07/12/2014  . Dementia 07/12/2014  . Insomnia 03/01/2012  . B12 deficiency 02/22/2012  . Memory loss 02/09/2012  . Hyperlipidemia 02/08/2012  . HTN (hypertension) 02/08/2012    Palliative Care Assessment & Plan   HPI: 83 y.o. female  with past medical history of advanced dementia, HTN, HLD, stroke, arthritis, hepatitis, depression admitted on 07/24/2017 with left hip pain and fracture after fall. After much discussion family has opted to forego surgery and palliative requested to assist with this decision making and support.    Assessment: Family not at bedside this morning. Hopeful that patient will be able to go to hospice facility today. Spoke with RN as Kimberly Craig has been hypertensive and has not received any pain medication. RN says that with attempting to provide SL medication or even foley care patient becomes very agitated and swats at staff not allowing them to care for her. She does appear very fearful and scared when I enter the room and then she just fell back asleep. Will change medication to IV for scheduled and prn medication. She may discharge to hospice with foley and IV in place - do not D/C.   Recommendations/Plan:  Pain/dyspnea: Morphine 0.5 mg IV TID to start. Morphine 1-2 mg IV every hour prn.   Anxiety/agitation: Haldol 1 mg every 6 hours  prn.   Goals of Care and Additional Recommendations:  Limitations on Scope of Treatment: Full Comfort Care  Code Status:  DNR  Prognosis:   < 2 weeks  Discharge Planning:  Hospice facility  Care plan was discussed with RN, hospice liaison   Thank you for allowing the Palliative Medicine Team to assist in the care of this patient.   Total Time 15 min Prolonged Time Billed  no      Greater than 50%  of this time was spent counseling and coordinating care related to the above assessment and plan.  Vinie Sill, NP Palliative Medicine Team Pager # [phone] (M-F 8a-5p) Team Phone # [phone] (Nights/Weekends)

## 2017-08-15 DEATH — deceased

## 2017-11-04 ENCOUNTER — Telehealth: Payer: Self-pay | Admitting: Family Medicine

## 2017-11-04 NOTE — Telephone Encounter (Signed)
Rx for ULTRACET dated 01/13/17 was not picked up. Document was shredded.
